# Patient Record
Sex: Male | Born: 1965
Health system: Southern US, Community
[De-identification: ages and names within clinical notes are randomized; demographics above are authoritative.]

## PROBLEM LIST (undated history)

## (undated) DIAGNOSIS — K219 Gastro-esophageal reflux disease without esophagitis: Secondary | ICD-10-CM

## (undated) DIAGNOSIS — J309 Allergic rhinitis, unspecified: Secondary | ICD-10-CM

## (undated) DIAGNOSIS — R519 Headache, unspecified: Secondary | ICD-10-CM

## (undated) DIAGNOSIS — I10 Essential (primary) hypertension: Secondary | ICD-10-CM

## (undated) DIAGNOSIS — R51 Headache: Secondary | ICD-10-CM

## (undated) DIAGNOSIS — G4730 Sleep apnea, unspecified: Secondary | ICD-10-CM

## (undated) HISTORY — PX: INGUINAL HERNIA REPAIR: SUR1180

## (undated) HISTORY — DX: Essential (primary) hypertension: I10

## (undated) HISTORY — PX: TONSILLECTOMY: SUR1361

## (undated) HISTORY — PX: ANKLE SURGERY: SHX546

## (undated) HISTORY — DX: Allergic rhinitis, unspecified: J30.9

---

## 2007-11-26 ENCOUNTER — Emergency Department: Payer: Self-pay | Admitting: Emergency Medicine

## 2009-06-15 IMAGING — CR CERVICAL SPINE - COMPLETE 4+ VIEW
1 series · 7 of 7 positions shown · non-contrast
Comparison: none

REASON FOR EXAM: pain
COMMENTS:

[Series 1: view not recorded · 0.17mm/px · 7 of 7 slices shown]
[im 1/7]
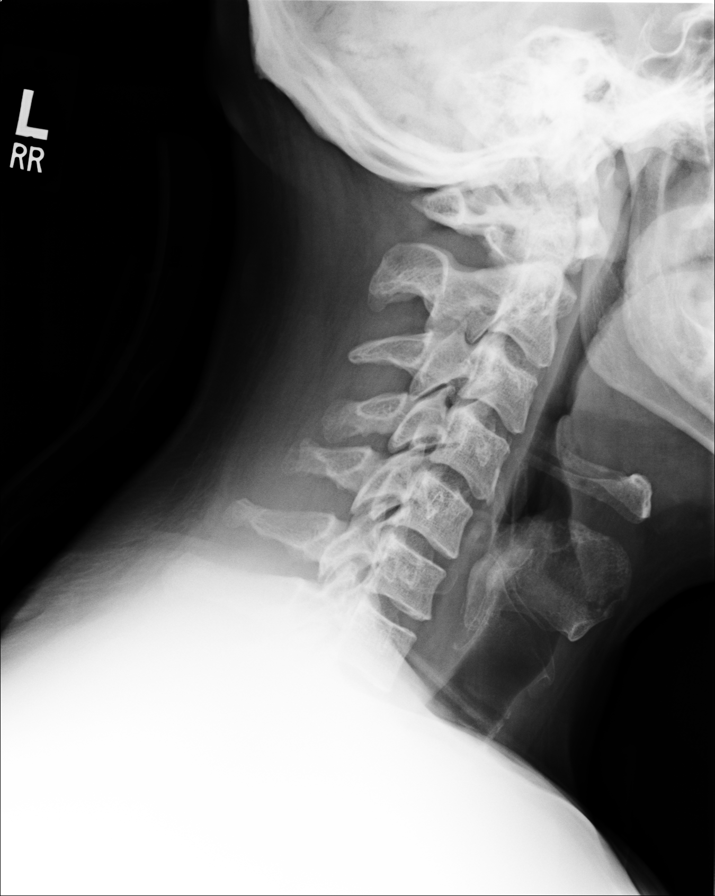
[im 2/7]
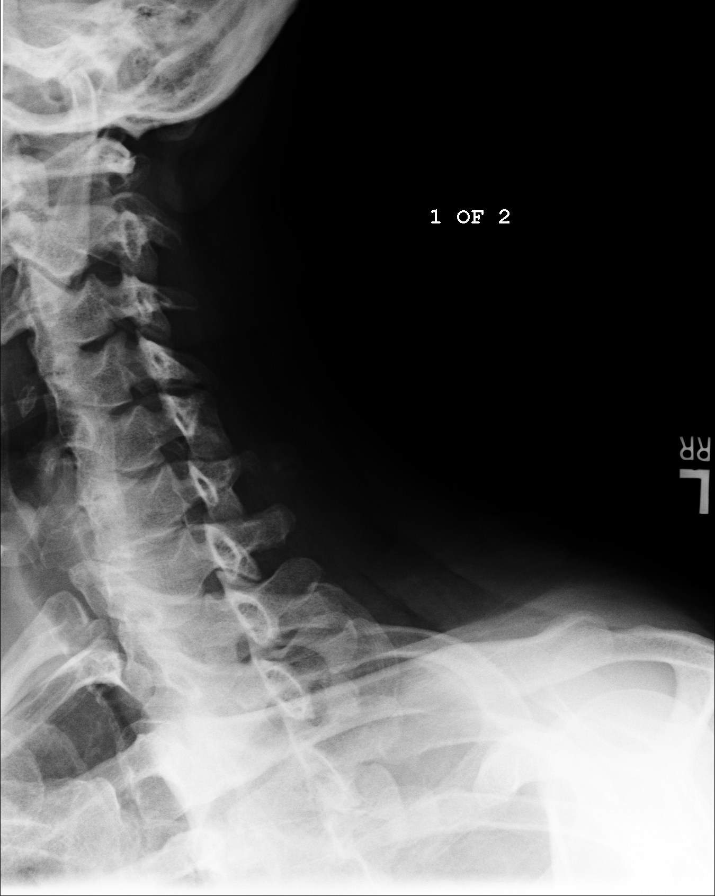
[im 3/7]
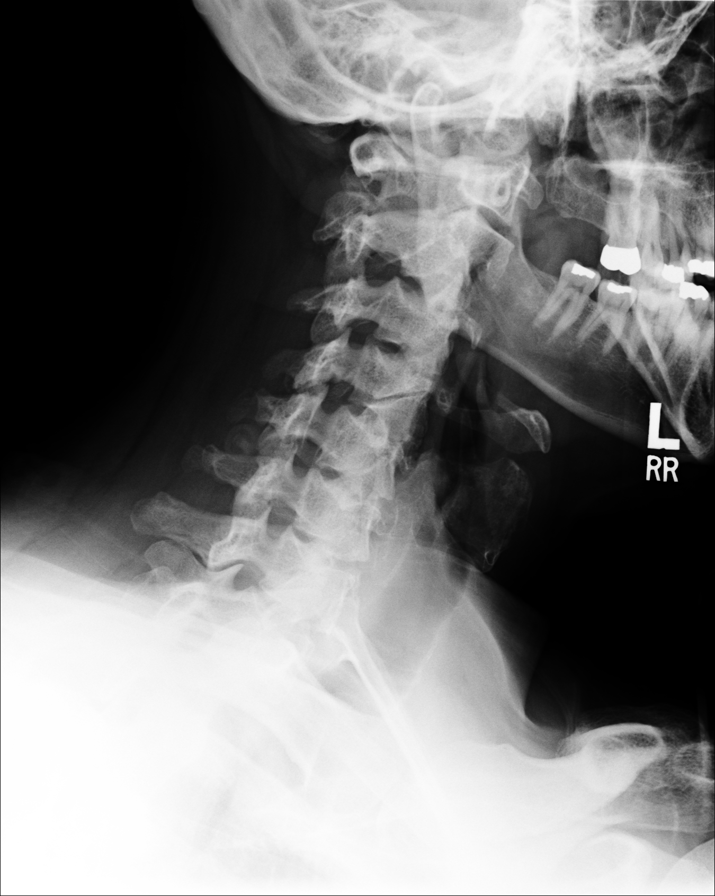
[im 4/7]
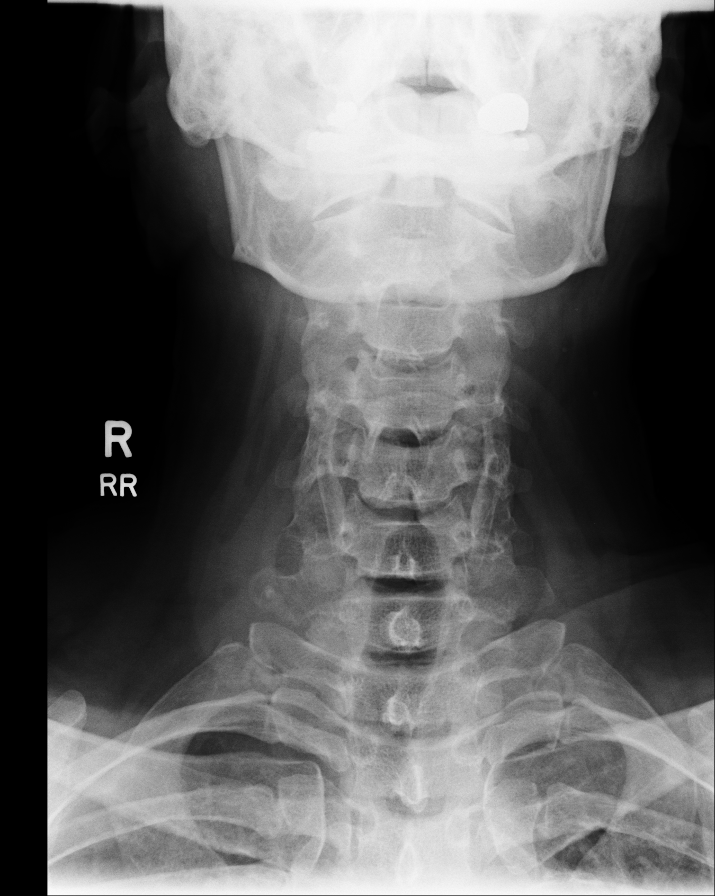
[im 5/7]
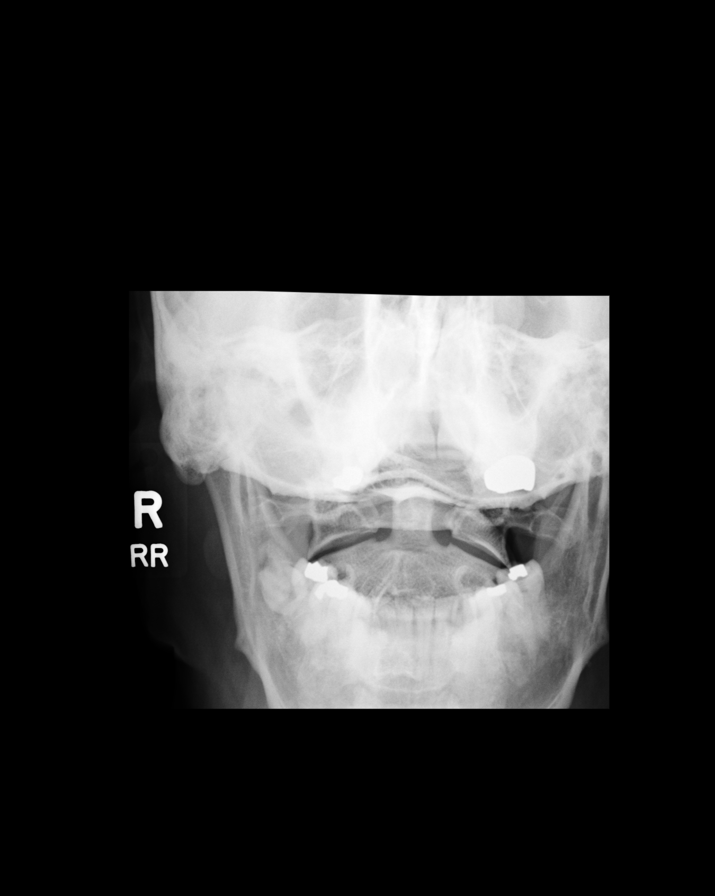
[im 6/7]
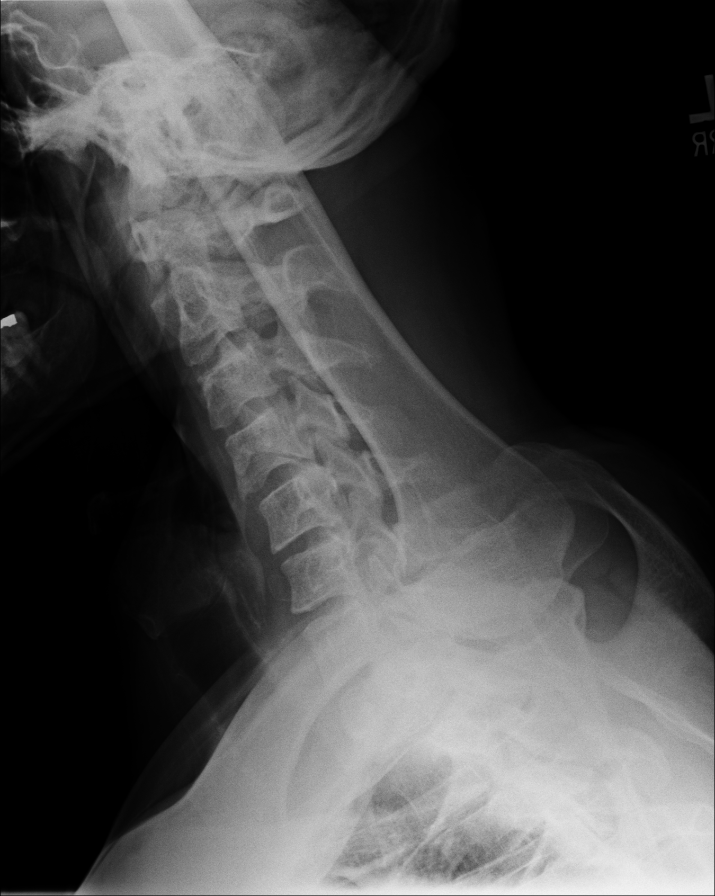
[im 7/7]
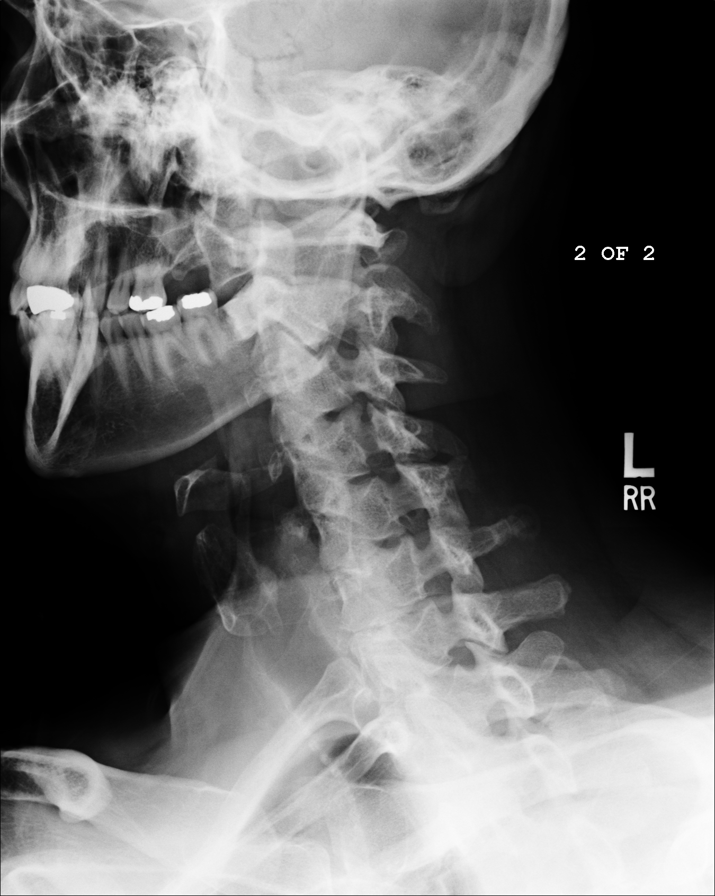

[7 of 7 positions shown; findings below may reference images not displayed]

PROCEDURE:     DXR - DXR CERVICAL SPINE COMPLETE  - November 26, 2007  [DATE]

RESULT:     Images of the cervical spine demonstrate straightening with loss
of the normal cervical lordosis. The foramina appear patent. The
prevertebral soft tissues are normal. No fracture is evident. The
craniocervical junction appears unremarkable.
IMPRESSION: 1. Loss of the normal cervical lordosis which may be secondary to muscle
spasm or positioning.
2. No acute bony abnormality evident.
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
## 2013-01-11 ENCOUNTER — Ambulatory Visit: Payer: Self-pay | Admitting: Orthopaedic Surgery

## 2014-04-12 ENCOUNTER — Ambulatory Visit: Payer: Self-pay | Admitting: Orthopaedic Surgery

## 2014-09-21 ENCOUNTER — Ambulatory Visit: Admit: 2014-09-21 | Disposition: A | Discharge status: Home / Self Care | Payer: Self-pay

## 2015-06-01 ENCOUNTER — Other Ambulatory Visit: Payer: Self-pay | Admitting: Family Medicine

## 2015-06-03 NOTE — Telephone Encounter (Signed)
apt 

## 2015-06-18 ENCOUNTER — Encounter: Payer: Self-pay | Admitting: Family Medicine

## 2015-06-18 ENCOUNTER — Ambulatory Visit (INDEPENDENT_AMBULATORY_CARE_PROVIDER_SITE_OTHER): Payer: BLUE CROSS/BLUE SHIELD | Admitting: Family Medicine

## 2015-06-18 VITALS — BP 151/91 | HR 65 | Temp 97.8°F | Ht 77.0 in | Wt 338.0 lb

## 2015-06-18 DIAGNOSIS — G4733 Obstructive sleep apnea (adult) (pediatric): Secondary | ICD-10-CM

## 2015-06-18 DIAGNOSIS — I10 Essential (primary) hypertension: Secondary | ICD-10-CM

## 2015-06-18 DIAGNOSIS — J0100 Acute maxillary sinusitis, unspecified: Secondary | ICD-10-CM

## 2015-06-18 DIAGNOSIS — Z0000 Encounter for general adult medical examination without abnormal findings: Secondary | ICD-10-CM

## 2015-06-18 MED ORDER — DOXYCYCLINE HYCLATE 100 MG PO TABS: 100.0000 mg | ORAL_TABLET | Freq: Two times a day (BID) | ORAL | Status: DC

## 2015-06-18 NOTE — Progress Notes (Signed)
BP 151/91 mmHg  Pulse 65  Temp(Src) 97.8 F (36.6 C)  Ht  (1.956 m)  Wt 338 lb (153.316 kg)  BMI 40.07 kg/m2  SpO2 97%   Subjective:    Patient ID: Jesse Wade, male    DOB: 11/20/1965, 49 y.o.   MRN: [id number]  HPI: Jesse Wade is a 49 y.o. male  Chief Complaint  Patient presents with  . Sinusitis    x 1 week, started out as a bad cold and now moved to head. Facial pain. Ear pain.   He has a sinus infection he thinks; it is on the right side; no teeth pain; not blowing out much Pain under the right eye and behind the cheek and right ear is stopped No sore throat; coughing some; sometimes produces phlegm, not seen any to tell me the color No wheezes No fevers No rash No travel, no zika exposure Wife was sick several weeks ago He has tried mucinex, severe congestion version, then tried sudafed  His BP has been decent the last few times at the doctor, though high today; he has been taking decongestants with this recent illness  OSA, but using BiPAP, full face mask  Relevant past medical, surgical, family and social history reviewed and updated as indicated. Interim medical history since our last visit reviewed. Allergies and medications reviewed and updated.  Review of Systems Per HPI unless specifically indicated above     Objective:    BP 151/91 mmHg  Pulse 65  Temp(Src) 97.8 F (36.6 C)  Ht  (1.956 m)  Wt 338 lb (153.316 kg)  BMI 40.07 kg/m2  SpO2 97%  Wt Readings from Last 3 Encounters:  06/18/15 338 lb (153.316 kg)  04/20/15 341 lb (154.677 kg)  04/19/14 341 lb (154.677 kg)   body mass index is 40.07 kg/(m^2).  Physical Exam  Constitutional: He appears well-developed and well-nourished. No distress.  Morbidly obese  HENT:  Head: Normocephalic and atraumatic.  Right Ear: Hearing and external ear normal.  Left Ear: Hearing and external ear normal.  Nose: Mucosal edema and rhinorrhea present. Right sinus exhibits frontal sinus tenderness.   Mouth/Throat: Oropharynx is clear and moist and mucous membranes are normal.  Eyes: EOM are normal. Right eye exhibits no discharge. Left eye exhibits no discharge. Right conjunctiva is not injected. Left conjunctiva is not injected. No scleral icterus.  Neck: No thyromegaly present.  Cardiovascular: Normal rate and regular rhythm.   Pulmonary/Chest: Effort normal and breath sounds normal.  Abdominal: Soft. Bowel sounds are normal. He exhibits no distension.  Musculoskeletal: He exhibits no edema.  Lymphadenopathy:    He has no cervical adenopathy.  Neurological: Coordination normal.  Skin: Skin is warm and dry. No pallor.  Psychiatric: He has a normal mood and affect. His behavior is normal. Judgment and thought content normal.      Assessment & Plan:   Problem List Items Addressed This Visit      Cardiovascular and Mediastinum   Hypertension    See AVS; not controlled today; avoid decongestans; try to follow DASH guidelines; recommended weight loss; return for close f/u        Respiratory   OSA treated with BiPAP    Weight loss would be helpful        Other   Morbid obesity (HCC)    Weight loss recommended; see the AVS for information and recommendations given; return for recheck in a few weeks       Other  Visit Diagnoses    Acute maxillary sinusitis, recurrence not specified    -  Primary    rest, hydration, antibiotics; discussed risk of C diff, see AVS    Relevant Medications    doxycycline (VIBRA-TABS) 100 MG tablet    Preventative health care        just used for blood work; he'll return for the true physical in 3-4 weeks    Relevant Orders    Lipid Panel w/o Chol/HDL Ratio (Completed)    PSA (Completed)    Comprehensive metabolic panel (Completed)       Follow up plan: Return 3-4 weeks, for complete physical and BP recheck.  Meds ordered this encounter  Medications  . doxycycline (VIBRA-TABS) 100 MG tablet    Sig: Take 1 tablet (100 mg total) by mouth  2 (two) times daily.    Dispense:  20 tablet    Refill:  0   An after-visit summary was printed and given to the patient at check-out.  Please see the patient instructions which may contain other information and recommendations beyond what is mentioned above in the assessment and plan.

## 2015-06-18 NOTE — Patient Instructions (Addendum)
Try to use PLAIN allergy or cold medicine without the decongestant Avoid: phenylephrine, phenylpropanolamine, and pseudoephredine Your goal blood pressure is less than 140 mmHg on top. Try to follow the DASH guidelines (DASH stands for Dietary Approaches to Stop Hypertension) Try to limit the sodium in your diet.  Ideally, consume less than 1.5 grams (less than 1,500mg ) per day. Do not add salt when cooking or at the table.  Check the sodium amount on labels when shopping, and choose items lower in sodium when given a choice. Avoid or limit foods that already contain a lot of sodium. Eat a diet rich in fruits and vegetables and whole grains. Monitor your blood pressure three times a week and call us in two weeks with your readings Return in three to four weeks for a physical and blood pressure and weight recheck here I do recommend a baby aspirin 81 mg daily (coated to protect your stomach) for heart protection If you start to exercise, start slow and build up gradually  Start the antibiotics Please do eat yogurt daily or take a probiotic daily for the next month or two We want to replace the healthy germs in the gut If you notice foul, watery diarrhea in the next two months, schedule an appointment RIGHT AWAY  Check out the information at familydoctor.org entitled "What It Takes to Lose Weight" Try to lose between 1-2 pounds per week by taking in fewer calories and burning off more calories You can succeed by limiting portions, limiting foods dense in calories and fat, becoming more active, and drinking 8 glasses of water a day (64 ounces) Don't skip meals, especially breakfast, as skipping meals may alter your metabolism Do not use over-the-counter weight loss pills or gimmicks that claim rapid weight loss A healthy BMI (or body mass index) is between 18.5 and 24.9 You can calculate your ideal BMI at the NIH website [employer]  DASH  Eating Plan DASH stands for "Dietary Approaches to Stop Hypertension." The DASH eating plan is a healthy eating plan that has been shown to reduce high blood pressure (hypertension). Additional health benefits may include reducing the risk of type 2 diabetes mellitus, heart disease, and stroke. The DASH eating plan may also help with weight loss. WHAT DO I NEED TO KNOW ABOUT THE DASH EATING PLAN? For the DASH eating plan, you will follow these general guidelines:  Choose foods with a percent daily value for sodium of less than 5% (as listed on the food label).  Use salt-free seasonings or herbs instead of table salt or sea salt.  Check with your health care provider or pharmacist before using salt substitutes.  Eat lower-sodium products, often labeled as "lower sodium" or "no salt added."  Eat fresh foods.  Eat more vegetables, fruits, and low-fat dairy products.  Choose whole grains. Look for the word "whole" as the first word in the ingredient list.  Choose fish and skinless chicken or [place] more often than red meat. Limit fish, poultry, and meat to 6 oz (170 g) each day.  Limit sweets, desserts, sugars, and sugary drinks.  Choose heart-healthy fats.  Limit cheese to 1 oz (28 g) per day.  Eat more home-cooked food and less restaurant, buffet, and fast food.  Limit fried foods.  Cook foods using methods other than frying.  Limit canned vegetables. If you do use them, rinse them well to decrease the sodium.  When eating at a restaurant, ask that your food be prepared with less salt, or no  salt if possible. WHAT FOODS CAN I EAT? Seek help from a dietitian for individual calorie needs. Grains Whole grain or whole wheat bread. Brown rice. Whole grain or whole wheat pasta. Quinoa, bulgur, and whole grain cereals. Low-sodium cereals. Corn or whole wheat flour tortillas. Whole grain cornbread. Whole grain crackers. Low-sodium crackers. Vegetables Fresh or frozen vegetables (raw,  steamed, roasted, or grilled). Low-sodium or reduced-sodium tomato and vegetable juices. Low-sodium or reduced-sodium tomato sauce and paste. Low-sodium or reduced-sodium canned vegetables.  Fruits All fresh, canned (in natural juice), or frozen fruits. Meat and Other Protein Products Ground beef (85% or leaner), grass-fed beef, or beef trimmed of fat. Skinless chicken or [place]. Ground chicken or [place]. Pork trimmed of fat. All fish and seafood. Eggs. Dried beans, peas, or lentils. Unsalted nuts and seeds. Unsalted canned beans. Dairy Low-fat dairy products, such as skim or 1% milk, 2% or reduced-fat cheeses, low-fat ricotta or cottage cheese, or plain low-fat yogurt. Low-sodium or reduced-sodium cheeses. Fats and Oils Tub margarines without trans fats. Light or reduced-fat mayonnaise and salad dressings (reduced sodium). Avocado. Safflower, olive, or canola oils. Natural peanut or almond butter. Other Unsalted popcorn and pretzels. The items listed above may not be a complete list of recommended foods or beverages. Contact your dietitian for more options. WHAT FOODS ARE NOT RECOMMENDED? Grains White bread. White pasta. White rice. Refined cornbread. Bagels and croissants. Crackers that contain trans fat. Vegetables Creamed or fried vegetables. Vegetables in a cheese sauce. Regular canned vegetables. Regular canned tomato sauce and paste. Regular tomato and vegetable juices. Fruits Dried fruits. Canned fruit in light or heavy syrup. Fruit juice. Meat and Other Protein Products Fatty cuts of meat. Ribs, chicken wings, bacon, sausage, bologna, salami, chitterlings, fatback, hot dogs, bratwurst, and packaged luncheon meats. Salted nuts and seeds. Canned beans with salt. Dairy Whole or 2% milk, cream, half-and-half, and cream cheese. Whole-fat or sweetened yogurt. Full-fat cheeses or blue cheese. Nondairy creamers and whipped toppings. Processed cheese, cheese spreads, or cheese  curds. Condiments Onion and garlic salt, seasoned salt, table salt, and sea salt. Canned and packaged gravies. Worcestershire sauce. Tartar sauce. Barbecue sauce. Teriyaki sauce. Soy sauce, including reduced sodium. Steak sauce. Fish sauce. Oyster sauce. Cocktail sauce. Horseradish. Ketchup and mustard. Meat flavorings and tenderizers. Bouillon cubes. Hot sauce. Tabasco sauce. Marinades. Taco seasonings. Relishes. Fats and Oils Butter, stick margarine, lard, shortening, ghee, and bacon fat. Coconut, palm kernel, or palm oils. Regular salad dressings. Other Pickles and olives. Salted popcorn and pretzels. The items listed above may not be a complete list of foods and beverages to avoid. Contact your dietitian for more information. WHERE CAN I FIND MORE INFORMATION? National Heart, Lung, and Blood Institute: CableProm[employer].itwww.nhlbi.nih.gov/health/health-topics/topics/dash/   This information is not intended to replace advice given to you by your health care provider. Make sure you discuss any questions you have with your health care provider.   Document Released: 05/28/2011 Document Revised: 06/29/2014 Document Reviewed: 04/12/2013 Elsevier Interactive Patient Education [employer]2016 Elsevier Inc.

## 2015-06-19 LAB — COMPREHENSIVE METABOLIC PANEL
A/G RATIO: 1.7 (ref 1.1–2.5)
ALBUMIN: 4 g/dL (ref 3.5–5.5)
ALT: 28 IU/L (ref 0–44)
AST: 22 IU/L (ref 0–40)
Alkaline Phosphatase: 85 IU/L (ref 39–117)
BUN / CREAT RATIO: 13 (ref 9–20)
BUN: 12 mg/dL (ref 6–24)
Bilirubin Total: 0.3 mg/dL (ref 0.0–1.2)
CALCIUM: 9.2 mg/dL (ref 8.7–10.2)
CO2: 24 mmol/L (ref 18–29)
CREATININE: 0.91 mg/dL (ref 0.76–1.27)
Chloride: 104 mmol/L (ref 96–106)
GFR, EST AFRICAN AMERICAN: 114 mL/min/{1.73_m2} (ref >59)
GFR, EST NON AFRICAN AMERICAN: 99 mL/min/{1.73_m2} (ref >59)
GLOBULIN, TOTAL: 2.4 g/dL (ref 1.5–4.5)
Glucose: 97 mg/dL (ref 65–99)
Potassium: 4.3 mmol/L (ref 3.5–5.2)
SODIUM: 143 mmol/L (ref 134–144)
Total Protein: 6.4 g/dL (ref 6.0–8.5)

## 2015-06-19 LAB — LIPID PANEL W/O CHOL/HDL RATIO
CHOLESTEROL TOTAL: 146 mg/dL (ref 100–199)
HDL: 40 mg/dL (ref >39)
LDL Calculated: 90 mg/dL (ref 0–99)
TRIGLYCERIDES: 82 mg/dL (ref 0–149)
VLDL Cholesterol Cal: 16 mg/dL (ref 5–40)

## 2015-06-19 LAB — PSA: Prostate Specific Ag, Serum: 0.5 ng/mL (ref 0.0–4.0)

## 2015-06-20 ENCOUNTER — Telehealth: Payer: Self-pay | Admitting: Family Medicine

## 2015-06-20 NOTE — Telephone Encounter (Signed)
I talked with patient, explained labs look great His home number is no longer valid ------------------ [facility] staff -- please remove home number; he is using his cell number now; thank you

## 2015-06-22 DIAGNOSIS — G4733 Obstructive sleep apnea (adult) (pediatric): Secondary | ICD-10-CM | POA: Insufficient documentation

## 2015-06-22 NOTE — Assessment & Plan Note (Signed)
Weight loss recommended; see the AVS for information and recommendations given; return for recheck in a few weeks

## 2015-06-22 NOTE — Assessment & Plan Note (Signed)
Weight loss would be helpful

## 2015-06-22 NOTE — Assessment & Plan Note (Signed)
See AVS; not controlled today; avoid decongestans; try to follow DASH guidelines; recommended weight loss; return for close f/u

## 2015-08-19 ENCOUNTER — Telehealth: Payer: Self-pay | Admitting: Family Medicine

## 2015-08-19 ENCOUNTER — Other Ambulatory Visit: Payer: Self-pay | Admitting: Family Medicine

## 2015-08-19 MED ORDER — OSELTAMIVIR PHOSPHATE 75 MG PO CAPS: 75.0000 mg | ORAL_CAPSULE | Freq: Every day | ORAL | Status: DC

## 2015-08-19 MED ORDER — OSELTAMIVIR PHOSPHATE 75 MG PO CAPS
75.0000 mg | ORAL_CAPSULE | Freq: Every day | ORAL | Status: DC
Start: 2015-08-19 — End: 2016-02-04

## 2015-08-19 NOTE — Telephone Encounter (Signed)
Wife diagnosed with flu. Needs prophylactic medicine. Rx sent to his pharmacy.

## 2015-09-08 ENCOUNTER — Other Ambulatory Visit: Payer: Self-pay | Admitting: Family Medicine

## 2015-09-23 ENCOUNTER — Encounter: Payer: BLUE CROSS/BLUE SHIELD | Admitting: Family Medicine

## 2015-12-09 ENCOUNTER — Other Ambulatory Visit: Payer: Self-pay | Admitting: Family Medicine

## 2015-12-09 NOTE — Telephone Encounter (Signed)
Apt?

## 2015-12-10 ENCOUNTER — Encounter: Payer: Self-pay | Admitting: Family Medicine

## 2015-12-10 NOTE — Telephone Encounter (Signed)
Letter sent.

## 2016-01-16 ENCOUNTER — Other Ambulatory Visit: Payer: Self-pay | Admitting: Family Medicine

## 2016-02-04 ENCOUNTER — Ambulatory Visit (INDEPENDENT_AMBULATORY_CARE_PROVIDER_SITE_OTHER): Payer: 59 | Admitting: Family Medicine

## 2016-02-04 ENCOUNTER — Encounter: Payer: Self-pay | Admitting: Family Medicine

## 2016-02-04 DIAGNOSIS — K429 Umbilical hernia without obstruction or gangrene: Secondary | ICD-10-CM

## 2016-02-04 MED ORDER — BENAZEPRIL HCL 40 MG PO TABS: 40.0000 mg | ORAL_TABLET | Freq: Every day | ORAL | 6 refills | Status: DC

## 2016-02-04 NOTE — Assessment & Plan Note (Signed)
Medication education given including incarceration Surgical referral and limitations discussed

## 2016-02-04 NOTE — Progress Notes (Signed)
BP 125/77 (BP Location: Left Arm, Patient Position: Sitting, Cuff Size: Normal)   Pulse (!) 56   Temp 97.9 F (36.6 C)   Ht 6' 5.1" (1.958 m)   Wt (!) 309 lb (140.2 kg)   SpO2 98%   BMI 36.55 kg/m    Subjective:    Patient ID: Jesse Wade, male    DOB: Oct 24, 1965, 50 y.o.   MRN: [id number]  HPI: Jesse Wade is a 50 y.o. male  Chief Complaint  Patient presents with  . Hernia    possible  Patient with. Umbilical discomfort bulging is gotten bigger down there and more uncomfortable has been ongoing off and on for years now is gotten worse Blood pressure doing well no complaints from medications taking Benzapril without problems. Relevant past medical, surgical, family and social history reviewed and updated as indicated. Interim medical history since our last visit reviewed. Allergies and medications reviewed and updated.  Review of Systems  Constitutional: Negative.   Respiratory: Negative.   Cardiovascular: Negative.     Per HPI unless specifically indicated above     Objective:    BP 125/77 (BP Location: Left Arm, Patient Position: Sitting, Cuff Size: Normal)   Pulse (!) 56   Temp 97.9 F (36.6 C)   Ht 6' 5.1" (1.958 m)   Wt (!) 309 lb (140.2 kg)   SpO2 98%   BMI 36.55 kg/m   Wt Readings from Last 3 Encounters:  02/04/16 (!) 309 lb (140.2 kg)  06/18/15 (!) 338 lb (153.3 kg)  04/20/15 (!) 341 lb (154.7 kg)    Physical Exam  Constitutional: He is oriented to person, place, and time. He appears well-developed and well-nourished. No distress.  HENT:  Head: Normocephalic and atraumatic.  Right Ear: Hearing normal.  Left Ear: Hearing normal.  Nose: Nose normal.  Eyes: Conjunctivae and lids are normal. Right eye exhibits no discharge. Left eye exhibits no discharge. No scleral icterus.  Cardiovascular: Normal rate, regular rhythm and normal heart sounds.   Pulmonary/Chest: Effort normal and breath sounds normal. No respiratory distress.  Abdominal: Soft.  Bowel sounds are normal.  Tender enlarged umbilical hernia  Musculoskeletal: Normal range of motion.  Neurological: He is alert and oriented to person, place, and time.  Skin: Skin is intact. No rash noted.  Psychiatric: He has a normal mood and affect. His speech is normal and behavior is normal. Judgment and thought content normal. Cognition and memory are normal.    Results for orders placed or performed in visit on 06/18/15  Lipid Panel w/o Chol/HDL Ratio  Result Value Ref Range   Cholesterol, Total 146 100 - 199 mg/dL   Triglycerides 82 0 - 149 mg/dL   HDL 40 >40>39 mg/dL   VLDL Cholesterol Cal 16 5 - 40 mg/dL   LDL Calculated 90 0 - 99 mg/dL  PSA  Result Value Ref Range   Prostate Specific Ag, Serum 0.5 0.0 - 4.0 ng/mL  Comprehensive metabolic panel  Result Value Ref Range   Glucose 97 65 - 99 mg/dL   BUN 12 6 - 24 mg/dL   Creatinine, Ser [id number] 0.76 - 1.27 mg/dL   GFR calc non Af Amer 99 >59 mL/min/1.73   GFR calc Af Amer 114 >59 mL/min/1.73   BUN/Creatinine Ratio 13 9 - 20   Sodium 143 134 - 144 mmol/L   Potassium 4.3 3.5 - 5.2 mmol/L   Chloride 104 96 - 106 mmol/L   CO2 24 18 - 29 mmol/L  Calcium 9.2 8.7 - 10.2 mg/dL   Total Protein 6.4 6.0 - 8.5 g/dL   Albumin 4.0 3.5 - 5.5 g/dL   Globulin, Total 2.4 1.5 - 4.5 g/dL   Albumin/Globulin Ratio 1.7 1.1 - 2.5   Bilirubin Total 0.3 0.0 - 1.2 mg/dL   Alkaline Phosphatase 85 39 - 117 IU/L   AST 22 0 - 40 IU/L   ALT 28 0 - 44 IU/L      Assessment & Plan:   Problem List Items Addressed This Visit      Other   Umbilical hernia    Medication education given including incarceration Surgical referral and limitations discussed      Relevant Orders   Ambulatory referral to General Surgery    Other Visit Diagnoses   None.      Follow up plan: Return if symptoms worsen or fail to improve, for Physical Exam.

## 2016-02-05 ENCOUNTER — Encounter: Payer: Self-pay | Admitting: *Deleted

## 2016-02-18 ENCOUNTER — Ambulatory Visit: Payer: Self-pay | Admitting: General Surgery

## 2016-02-18 ENCOUNTER — Ambulatory Visit (INDEPENDENT_AMBULATORY_CARE_PROVIDER_SITE_OTHER): Payer: 59 | Admitting: General Surgery

## 2016-02-18 ENCOUNTER — Encounter: Payer: Self-pay | Admitting: General Surgery

## 2016-02-18 VITALS — BP 130/72 | HR 76 | Resp 14 | Ht 77.0 in | Wt 316.0 lb

## 2016-02-18 DIAGNOSIS — K429 Umbilical hernia without obstruction or gangrene: Secondary | ICD-10-CM

## 2016-02-18 NOTE — Patient Instructions (Addendum)
The patient is aware to call back for any questions or concerns. Hernia, Adult A hernia is the bulging of an organ or tissue through a weak spot in the muscles of the abdomen (abdominal wall). Hernias develop most often near the navel or groin. There are many kinds of hernias. Common kinds include:  Femoral hernia. This kind of hernia develops under the groin in the upper thigh area.  Inguinal hernia. This kind of hernia develops in the groin or scrotum.  Umbilical hernia. This kind of hernia develops near the navel.  Hiatal hernia. This kind of hernia causes part of the stomach to be pushed up into the chest.  Incisional hernia. This kind of hernia bulges through a scar from an abdominal surgery. CAUSES This condition may be caused by:  Heavy lifting.  Coughing over a long period of time.  Straining to have a bowel movement.  An incision made during an abdominal surgery.  A birth defect (congenital defect).  Excess weight or obesity.  Smoking.  Poor nutrition.  Cystic fibrosis.  Excess fluid in the abdomen.  Undescended testicles. SYMPTOMS Symptoms of a hernia include:  A lump on the abdomen. This is the first sign of a hernia. The lump may become more obvious with standing, straining, or coughing. It may get bigger over time if it is not treated or if the condition causing it is not treated.  Pain. A hernia is usually painless, but it may become painful over time if treatment is delayed. The pain is usually dull and may get worse with standing or lifting heavy objects. Sometimes a hernia gets tightly squeezed in the weak spot (strangulated) or stuck there (incarcerated) and causes additional symptoms. These symptoms may include:  Vomiting.  Nausea.  Constipation.  Irritability. DIAGNOSIS A hernia may be diagnosed with:  A physical exam. During the exam your health care provider may ask you to cough or to make a specific movement, because a hernia is usually  more visible when you move.  Imaging tests. These can include:  X-rays.  Ultrasound.  CT scan. TREATMENT A hernia that is small and painless may not need to be treated. A hernia that is large or painful may be treated with surgery. Inguinal hernias may be treated with surgery to prevent incarceration or strangulation. Strangulated hernias are always treated with surgery, because lack of blood to the trapped organ or tissue can cause it to die. Surgery to treat a hernia involves pushing the bulge back into place and repairing the weak part of the abdomen. HOME CARE INSTRUCTIONS  Avoid straining.  Do not lift anything heavier than 10 lb (4.5 kg).  Lift with your leg muscles, not your back muscles. This helps avoid strain.  When coughing, try to cough gently.  Prevent constipation. Constipation leads to straining with bowel movements, which can make a hernia worse or cause a hernia repair to break down. You can prevent constipation by:  Eating a high-fiber diet that includes plenty of fruits and vegetables.  Drinking enough fluids to keep your urine clear or pale yellow. Aim to drink 6-8 glasses of water per day.  Using a stool softener as directed by your health care provider.  Lose weight, if you are overweight.  Do not use any tobacco products, including cigarettes, chewing tobacco, or electronic cigarettes. If you need help quitting, ask your health care provider.  Keep all follow-up visits as directed by your health care provider. This is important. Your health care provider may   need to monitor your condition. SEEK MEDICAL CARE IF:  You have swelling, redness, and pain in the affected area.  Your bowel habits change. SEEK IMMEDIATE MEDICAL CARE IF:  You have a fever.  You have abdominal pain that is getting worse.  You feel nauseous or you vomit.  You cannot push the hernia back in place by gently pressing on it while you are lying down.  The hernia:  Changes in  shape or size.  Is stuck outside the abdomen.  Becomes discolored.  Feels hard or tender.   This information is not intended to replace advice given to you by your health care provider. Make sure you discuss any questions you have with your health care provider.   Document Released: 06/08/2005 Document Revised: 06/29/2014 Document Reviewed: 04/18/2014 Elsevier Interactive Patient Education 2016 Elsevier Inc.  

## 2016-02-18 NOTE — Progress Notes (Signed)
Patient ID: Jesse Wade, male   DOB: 1966/02/28, 50 y.o.   MRN: [id number]  Chief Complaint  Patient presents with  . Hernia    HPI Jesse Wade is a 50 y.o. male here today for a evaluation of a umbilical hernia. Patient states the discomfort bulging was noticed about 1-2 months ago. The discomfort is described as dull ache. He just started back going to the gym over the past 6-8 months and he has lost 30-35 pounds. I have reviewed the history of present illness with the patient.    HPI  Past Medical History:  Diagnosis Date  . Allergic rhinitis   . Hypertension     Past Surgical History:  Procedure Laterality Date  . ANKLE SURGERY Right 2014, 2015   x3  . INGUINAL HERNIA REPAIR Bilateral    childhood  . TONSILLECTOMY      Family History  Problem Relation Age of Onset  . Heart disease Mother   . Alcohol abuse Father   . Heart disease Maternal Aunt   . Heart disease Maternal Grandmother   . COPD Maternal Grandmother   . Diabetes Maternal Grandfather   . Cancer Neg Hx   . Hypertension Neg Hx   . Stroke Neg Hx     Social History Social History  Substance Use Topics  . Smoking status: Never Smoker  . Smokeless tobacco: Never Used  . Alcohol use No    Allergies  Allergen Reactions  . Amlodipine Other (See Comments)    Edema   . Penicillins Rash    Current Outpatient Prescriptions  Medication Sig Dispense Refill  . benazepril (LOTENSIN) 40 MG tablet Take 1 tablet (40 mg total) by mouth daily. 30 tablet 6  . levocetirizine (XYZAL) 5 MG tablet Take 5 mg by mouth every evening.   6   No current facility-administered medications for this visit.     Review of Systems Review of Systems  Constitutional: Negative.   Respiratory: Negative.   Cardiovascular: Negative.     Blood pressure 130/72, pulse 76, resp. rate 14, height 6\' 5"  (1.956 m), weight (!) 316 lb (143.3 kg).  Physical Exam Physical Exam  Constitutional: He is oriented to person, place, and  time. He appears well-developed and well-nourished.  HENT:  Mouth/Throat: Oropharynx is clear and moist.  Eyes: Conjunctivae are normal. No scleral icterus.  Neck: Neck supple.  Cardiovascular: Normal rate, regular rhythm and normal heart sounds.   Pulmonary/Chest: Effort normal and breath sounds normal.  Abdominal: Soft. Normal appearance and bowel sounds are normal. There is tenderness. A hernia is present. Hernia confirmed negative in the right inguinal area and confirmed negative in the left inguinal area.  3 cm umbilical hernia, partially reducible mildly tender.  Lymphadenopathy:    He has no cervical adenopathy.  Neurological: He is alert and oriented to person, place, and time.  Skin: Skin is warm and dry.  Psychiatric: His behavior is normal.    Data Reviewed Progress notes.  Assessment    3 cm umbilical hernia, partially reducible mildly tender. Recommended repair, discussed fully with pt.He is agreeable.    Plan    Hernia precautions and incarceration were discussed with the patient. If they develop symptoms of an incarcerated hernia, they were encouraged to seek prompt medical attention.  I have recommended repair of the hernia using mesh on an outpatient basis in the near future. The risk of infection was reviewed. The role of prosthetic mesh to minimize the risk of recurrence  was reviewed.  Plan on 1-2 weeks recovery time.     This information has been scribed by Dorathy DaftMarsha Hatch RN, BSN,BC.   Tahjae Durr G 02/18/2016, 4:46 PM

## 2016-02-19 ENCOUNTER — Telehealth: Payer: Self-pay

## 2016-02-19 ENCOUNTER — Other Ambulatory Visit: Payer: Self-pay | Admitting: General Surgery

## 2016-02-19 DIAGNOSIS — K4090 Unilateral inguinal hernia, without obstruction or gangrene, not specified as recurrent: Secondary | ICD-10-CM

## 2016-02-19 NOTE — Telephone Encounter (Signed)
Call to patient to see about scheduling his surgery for Umbilical hernia repair. The patient is scheduled [facility]. He will pre admit by phone. The patient is aware of date and instructions.

## 2016-02-21 ENCOUNTER — Encounter
Admission: RE | Admit: 2016-02-21 | Discharge: 2016-02-21 | Disposition: A | Discharge status: Home / Self Care | Payer: 59 | Source: Ambulatory Visit | Attending: General Surgery | Admitting: General Surgery

## 2016-02-21 HISTORY — DX: Headache: R51

## 2016-02-21 HISTORY — DX: Sleep apnea, unspecified: G47.30

## 2016-02-21 HISTORY — DX: Headache, unspecified: R51.9

## 2016-02-21 NOTE — Patient Instructions (Signed)
  Your procedure is scheduled on: 02-25-16  Report to Same Day Surgery 2nd floor medical mall To find out your arrival time please call [phone] between 1PM - 3PM on   Remember: Instructions that are not followed completely may result in serious medical risk, up to and including death, or upon the discretion of your surgeon and anesthesiologist your surgery may need to be rescheduled.    _x___ 1. Do not eat food or drink liquids after midnight. No gum chewing or hard candies.     __x__ 2. No Alcohol for 24 hours before or after surgery.   __x__3. No Smoking for 24 prior to surgery.   ____  4. Bring all medications with you on the day of surgery if instructed.    __x__ 5. Notify your doctor if there is any change in your medical condition     (cold, fever, infections).     Do not wear jewelry, make-up, hairpins, clips or nail polish.  Do not wear lotions, powders, or perfumes. You may wear deodorant.  Do not shave 48 hours prior to surgery. Men may shave face and neck.  Do not bring valuables to the hospital.    [facility] is not responsible for any belongings or valuables.               Contacts, dentures or bridgework may not be worn into surgery.  Leave your suitcase in the car. After surgery it may be brought to your room.  For patients admitted to the hospital, discharge time is determined by your treatment team.   Patients discharged the day of surgery will not be allowed to drive home.    Please read over the following fact sheets that you were given:   [facility] Preparing for Surgery and or MRSA Information   _x___ Take these medicines the morning of surgery with A SIP OF WATER:    1. BENAZEPRIL  2.  3.  4.  5.  6.  ____ Fleet Enema (as directed)   ____ Use CHG Soap or sage wipes as directed on instruction sheet   ____ Use inhalers on the day of surgery and bring to hospital day of surgery  ____ Stop metformin 2 days prior to surgery    ____ Take 1/2 of  usual insulin dose the night before surgery and none on the morning of surgery.   ____ Stop aspirin or coumadin, or plavix  _x__ Stop Anti-inflammatories such as Advil, Aleve, Ibuprofen, Motrin, Naproxen,          Naprosyn, Goodies powders or aspirin products. Ok to take Tylenol.   ____ Stop supplements until after surgery.    ____ Bring C-Pap to the hospital.

## 2016-02-25 ENCOUNTER — Encounter: Payer: Self-pay | Admitting: *Deleted

## 2016-02-25 ENCOUNTER — Ambulatory Visit: Payer: 59 | Admitting: Anesthesiology

## 2016-02-25 ENCOUNTER — Encounter
Admission: RE | Disposition: A | Discharge status: Home / Self Care | Payer: Self-pay | Source: Ambulatory Visit | Attending: General Surgery

## 2016-02-25 ENCOUNTER — Ambulatory Visit
Admission: RE | Admit: 2016-02-25 | Discharge: 2016-02-25 | Disposition: A | Discharge status: Home / Self Care | Payer: 59 | Source: Ambulatory Visit | Attending: General Surgery | Admitting: General Surgery

## 2016-02-25 DIAGNOSIS — K4090 Unilateral inguinal hernia, without obstruction or gangrene, not specified as recurrent: Secondary | ICD-10-CM

## 2016-02-25 DIAGNOSIS — I10 Essential (primary) hypertension: Secondary | ICD-10-CM | POA: Insufficient documentation

## 2016-02-25 DIAGNOSIS — K429 Umbilical hernia without obstruction or gangrene: Secondary | ICD-10-CM | POA: Diagnosis not present

## 2016-02-25 DIAGNOSIS — G4730 Sleep apnea, unspecified: Secondary | ICD-10-CM | POA: Insufficient documentation

## 2016-02-25 HISTORY — PX: UMBILICAL HERNIA REPAIR: SHX196

## 2016-02-25 SURGERY — REPAIR, HERNIA, UMBILICAL, ADULT
Anesthesia: General | Wound class: Clean

## 2016-02-25 MED ORDER — CEFAZOLIN SODIUM-DEXTROSE 2-4 GM/100ML-% IV SOLN
INTRAVENOUS | Status: AC
  Filled 2016-02-25: qty 100

## 2016-02-25 MED ORDER — ONDANSETRON HCL 4 MG/2ML IJ SOLN: 4.0000 mg | Freq: Once | INTRAMUSCULAR | Status: DC | PRN

## 2016-02-25 MED ORDER — ACETAMINOPHEN 10 MG/ML IV SOLN
INTRAVENOUS | Status: DC | PRN
  Administered 2016-02-25: 1000 mg via INTRAVENOUS

## 2016-02-25 MED ORDER — FAMOTIDINE 20 MG PO TABS
ORAL_TABLET | ORAL | Status: AC
  Filled 2016-02-25: qty 1

## 2016-02-25 MED ORDER — BUPIVACAINE HCL (PF) 0.5 % IJ SOLN
INTRAMUSCULAR | Status: AC
  Filled 2016-02-25: qty 30

## 2016-02-25 MED ORDER — LIDOCAINE HCL (PF) 1 % IJ SOLN
INTRAMUSCULAR | Status: AC
  Filled 2016-02-25: qty 30

## 2016-02-25 MED ORDER — OXYCODONE-ACETAMINOPHEN 5-325 MG PO TABS: 1.0000 | ORAL_TABLET | ORAL | 0 refills | Status: DC | PRN

## 2016-02-25 MED ORDER — ACETAMINOPHEN 10 MG/ML IV SOLN
INTRAVENOUS | Status: AC
  Filled 2016-02-25: qty 100

## 2016-02-25 MED ORDER — GLYCOPYRROLATE 0.2 MG/ML IJ SOLN
INTRAMUSCULAR | Status: DC | PRN
  Administered 2016-02-25: 0.2 mg via INTRAVENOUS

## 2016-02-25 MED ORDER — CEFAZOLIN SODIUM-DEXTROSE 2-4 GM/100ML-% IV SOLN
2.0000 g | INTRAVENOUS | Status: AC
  Administered 2016-02-25: 2 g via INTRAVENOUS

## 2016-02-25 MED ORDER — BUPIVACAINE HCL (PF) 0.5 % IJ SOLN
INTRAMUSCULAR | Status: DC | PRN
  Administered 2016-02-25: 30 mL

## 2016-02-25 MED ORDER — FAMOTIDINE 20 MG PO TABS
20.0000 mg | ORAL_TABLET | Freq: Once | ORAL | Status: AC
  Administered 2016-02-25: 20 mg via ORAL

## 2016-02-25 MED ORDER — FENTANYL CITRATE (PF) 100 MCG/2ML IJ SOLN: 25.0000 ug | INTRAMUSCULAR | Status: DC | PRN

## 2016-02-25 MED ORDER — LACTATED RINGERS IV SOLN
INTRAVENOUS | Status: DC
  Administered 2016-02-25 (×2): via INTRAVENOUS

## 2016-02-25 MED ORDER — ONDANSETRON HCL 4 MG/2ML IJ SOLN
INTRAMUSCULAR | Status: DC | PRN
  Administered 2016-02-25: 4 mg via INTRAVENOUS

## 2016-02-25 MED ORDER — FENTANYL CITRATE (PF) 100 MCG/2ML IJ SOLN
INTRAMUSCULAR | Status: DC | PRN
  Administered 2016-02-25 (×2): 50 ug via INTRAVENOUS

## 2016-02-25 MED ORDER — EPHEDRINE SULFATE 50 MG/ML IJ SOLN
INTRAMUSCULAR | Status: DC | PRN
  Administered 2016-02-25: 10 mg via INTRAVENOUS

## 2016-02-25 MED ORDER — MIDAZOLAM HCL 2 MG/2ML IJ SOLN
INTRAMUSCULAR | Status: DC | PRN
  Administered 2016-02-25: 2 mg via INTRAVENOUS

## 2016-02-25 MED ORDER — OXYMETAZOLINE HCL 0.05 % NA SOLN
NASAL | Status: DC | PRN
  Administered 2016-02-25 (×2): 2 via NASAL

## 2016-02-25 MED ORDER — LIDOCAINE HCL (CARDIAC) 20 MG/ML IV SOLN
INTRAVENOUS | Status: DC | PRN
  Administered 2016-02-25: 60 mg via INTRAVENOUS

## 2016-02-25 MED ORDER — PROPOFOL 10 MG/ML IV BOLUS
INTRAVENOUS | Status: DC | PRN
  Administered 2016-02-25: 250 mg via INTRAVENOUS

## 2016-02-25 MED ORDER — DEXMEDETOMIDINE HCL IN NACL 200 MCG/50ML IV SOLN
INTRAVENOUS | Status: DC | PRN
  Administered 2016-02-25 (×2): 8 ug via INTRAVENOUS

## 2016-02-25 MED ORDER — CHLORHEXIDINE GLUCONATE CLOTH 2 % EX PADS: 6.0000 | MEDICATED_PAD | Freq: Once | CUTANEOUS | Status: DC

## 2016-02-25 SURGICAL SUPPLY — 28 items
BLADE SURG 15 STRL SS SAFETY (BLADE) ×2 IMPLANT
CANISTER SUCT 1200ML W/VALVE (MISCELLANEOUS) ×2 IMPLANT
CHLORAPREP W/TINT 26ML (MISCELLANEOUS) ×2 IMPLANT
DECANTER SPIKE VIAL GLASS SM (MISCELLANEOUS) IMPLANT
DERMABOND ADVANCED (GAUZE/BANDAGES/DRESSINGS) ×1
DERMABOND ADVANCED .7 DNX12 (GAUZE/BANDAGES/DRESSINGS) ×1 IMPLANT
DRAPE LAPAROTOMY 100X77 ABD (DRAPES) ×2 IMPLANT
ELECT REM PT RETURN 9FT ADLT (ELECTROSURGICAL) ×2
ELECTRODE REM PT RTRN 9FT ADLT (ELECTROSURGICAL) ×1 IMPLANT
GLOVE BIO SURGEON STRL SZ7 (GLOVE) ×12 IMPLANT
GOWN STRL REUS W/ TWL LRG LVL3 (GOWN DISPOSABLE) ×3 IMPLANT
GOWN STRL REUS W/TWL LRG LVL3 (GOWN DISPOSABLE) ×3
KIT RM TURNOVER STRD PROC AR (KITS) ×2 IMPLANT
LABEL OR SOLS (LABEL) ×2 IMPLANT
LIQUID BAND (GAUZE/BANDAGES/DRESSINGS) IMPLANT
MESH VENTRALEX ST 2.5 CRC MED (Mesh General) ×2 IMPLANT
NEEDLE HYPO 25X1 1.5 SAFETY (NEEDLE) ×2 IMPLANT
NS IRRIG 500ML POUR BTL (IV SOLUTION) ×2 IMPLANT
PACK BASIN MINOR ARMC (MISCELLANEOUS) ×2 IMPLANT
SPONGE KITTNER 5P (MISCELLANEOUS) ×2 IMPLANT
SUT PROLENE 0 CT 2 (SUTURE) ×4 IMPLANT
SUT VIC AB 2-0 SH 27 (SUTURE)
SUT VIC AB 2-0 SH 27XBRD (SUTURE) IMPLANT
SUT VIC AB 3-0 SH 27 (SUTURE) ×1
SUT VIC AB 3-0 SH 27X BRD (SUTURE) ×1 IMPLANT
SUT VIC AB 4-0 FS2 27 (SUTURE) ×2 IMPLANT
SUT VICRYL+ 3-0 144IN (SUTURE) ×2 IMPLANT
SYR CONTROL 10ML (SYRINGE) ×2 IMPLANT

## 2016-02-25 NOTE — Anesthesia Procedure Notes (Signed)
Procedure Name: LMA Insertion Date/Time: 02/25/2016 10:05 AM Performed by: Tera HelperHARRIS, Tamarick Kovalcik ALLEN Pre-anesthesia Checklist: Patient identified, Emergency Drugs available, Suction available, Patient being monitored and Timeout performed Patient Re-evaluated:Patient Re-evaluated prior to inductionOxygen Delivery Method: Circle system utilized Preoxygenation: Pre-oxygenation with 100% oxygen Intubation Type: IV induction Ventilation: Mask ventilation with difficulty LMA: LMA inserted LMA Size: 5.0 Tube type: Oral Number of attempts: 2 Tube secured with: Tape

## 2016-02-25 NOTE — Transfer of Care (Signed)
Immediate Anesthesia Transfer of Care Note  Patient: Jesse MoodyJohn D Veltre  Procedure(s) Performed: Procedure(s): HERNIA REPAIR UMBILICAL ADULT (N/A)  Patient Location: PACU  Anesthesia Type:General  Level of Consciousness: awake  Airway & Oxygen Therapy: Patient connected to face mask oxygen  Post-op Assessment: Report given to RN  Post vital signs: stable  Last Vitals:  Vitals:   02/25/16 0843  BP: 127/77  Pulse: (!) 57  Resp: 16  Temp: 36.7 C    Last Pain:  Vitals:   02/25/16 0843  TempSrc: Tympanic         Complications: No apparent anesthesia complications

## 2016-02-25 NOTE — OR Nursing (Signed)
Dr. Evette CristalSankar into see pt and family.

## 2016-02-25 NOTE — H&P (View-Only) (Signed)
Patient ID: Jesse Wade, male   DOB: 1966/02/28, 50 y.o.   MRN: [id number]  Chief Complaint  Patient presents with  . Hernia    HPI Jesse Wade is a 50 y.o. male here today for a evaluation of a umbilical hernia. Patient states the discomfort bulging was noticed about 1-2 months ago. The discomfort is described as dull ache. He just started back going to the gym over the past 6-8 months and he has lost 30-35 pounds. I have reviewed the history of present illness with the patient.    HPI  Past Medical History:  Diagnosis Date  . Allergic rhinitis   . Hypertension     Past Surgical History:  Procedure Laterality Date  . ANKLE SURGERY Right 2014, 2015   x3  . INGUINAL HERNIA REPAIR Bilateral    childhood  . TONSILLECTOMY      Family History  Problem Relation Age of Onset  . Heart disease Mother   . Alcohol abuse Father   . Heart disease Maternal Aunt   . Heart disease Maternal Grandmother   . COPD Maternal Grandmother   . Diabetes Maternal Grandfather   . Cancer Neg Hx   . Hypertension Neg Hx   . Stroke Neg Hx     Social History Social History  Substance Use Topics  . Smoking status: Never Smoker  . Smokeless tobacco: Never Used  . Alcohol use No    Allergies  Allergen Reactions  . Amlodipine Other (See Comments)    Edema   . Penicillins Rash    Current Outpatient Prescriptions  Medication Sig Dispense Refill  . benazepril (LOTENSIN) 40 MG tablet Take 1 tablet (40 mg total) by mouth daily. 30 tablet 6  . levocetirizine (XYZAL) 5 MG tablet Take 5 mg by mouth every evening.   6   No current facility-administered medications for this visit.     Review of Systems Review of Systems  Constitutional: Negative.   Respiratory: Negative.   Cardiovascular: Negative.     Blood pressure 130/72, pulse 76, resp. rate 14, height 6\' 5"  (1.956 m), weight (!) 316 lb (143.3 kg).  Physical Exam Physical Exam  Constitutional: He is oriented to person, place, and  time. He appears well-developed and well-nourished.  HENT:  Mouth/Throat: Oropharynx is clear and moist.  Eyes: Conjunctivae are normal. No scleral icterus.  Neck: Neck supple.  Cardiovascular: Normal rate, regular rhythm and normal heart sounds.   Pulmonary/Chest: Effort normal and breath sounds normal.  Abdominal: Soft. Normal appearance and bowel sounds are normal. There is tenderness. A hernia is present. Hernia confirmed negative in the right inguinal area and confirmed negative in the left inguinal area.  3 cm umbilical hernia, partially reducible mildly tender.  Lymphadenopathy:    He has no cervical adenopathy.  Neurological: He is alert and oriented to person, place, and time.  Skin: Skin is warm and dry.  Psychiatric: His behavior is normal.    Data Reviewed Progress notes.  Assessment    3 cm umbilical hernia, partially reducible mildly tender. Recommended repair, discussed fully with pt.He is agreeable.    Plan    Hernia precautions and incarceration were discussed with the patient. If they develop symptoms of an incarcerated hernia, they were encouraged to seek prompt medical attention.  I have recommended repair of the hernia using mesh on an outpatient basis in the near future. The risk of infection was reviewed. The role of prosthetic mesh to minimize the risk of recurrence  was reviewed.  Plan on 1-2 weeks recovery time.     This information has been scribed by Dorathy DaftMarsha Hatch RN, BSN,BC.   Arabi Antolin G 02/18/2016, 4:46 PM

## 2016-02-25 NOTE — Anesthesia Postprocedure Evaluation (Signed)
Anesthesia Post Note  Patient: Jesse MoodyJohn D Wade  Procedure(s) Performed: Procedure(s) (LRB): HERNIA REPAIR UMBILICAL ADULT (N/A)  Patient location during evaluation: PACU Anesthesia Type: General Level of consciousness: awake and alert Pain management: pain level controlled Vital Signs Assessment: post-procedure vital signs reviewed and stable Respiratory status: spontaneous breathing, nonlabored ventilation, respiratory function stable and patient connected to nasal cannula oxygen Cardiovascular status: blood pressure returned to baseline and stable Postop Assessment: no signs of nausea or vomiting Anesthetic complications: no    Last Vitals:  Vitals:   02/25/16 1156 02/25/16 1222  BP: 108/65 110/68  Pulse: (!) 59 (!) 58  Resp: 16 16  Temp: 36.4 C     Last Pain:  Vitals:   02/25/16 1222  TempSrc:   PainSc: 3                  Yevette EdwardsJames G Levada Bowersox

## 2016-02-25 NOTE — Discharge Instructions (Signed)

## 2016-02-25 NOTE — Anesthesia Preprocedure Evaluation (Signed)
Anesthesia Evaluation  Patient identified by MRN, date of birth, ID band Patient awake    Reviewed: Allergy & Precautions, H&P , NPO status , Patient's Chart, lab work & pertinent test results, reviewed documented beta blocker date and time   Airway Mallampati: II  TM Distance: >3 FB Neck ROM: full    Dental  (+) Teeth Intact   Pulmonary neg pulmonary ROS, sleep apnea ,    Pulmonary exam normal        Cardiovascular hypertension, negative cardio ROS Normal cardiovascular exam Rhythm:regular Rate:Normal     Neuro/Psych  Headaches, negative neurological ROS  negative psych ROS   GI/Hepatic negative GI ROS, Neg liver ROS,   Endo/Other  negative endocrine ROS  Renal/GU negative Renal ROS  negative genitourinary   Musculoskeletal   Abdominal   Peds  Hematology negative hematology ROS (+)   Anesthesia Other Findings Past Medical History: No date: Allergic rhinitis No date: Headache     Comment: H/O MIGRAINES No date: Hypertension No date: Sleep apnea     Comment: BIPAP OR CPAP-PT UNSURE WHICH ONE Past Surgical History: 2014, 2015: ANKLE SURGERY Right     Comment: x3 No date: INGUINAL HERNIA REPAIR Bilateral     Comment: childhood No date: TONSILLECTOMY BMI    Body Mass Index:  37.47 kg/m     Reproductive/Obstetrics negative OB ROS                             Anesthesia Physical Anesthesia Plan  ASA: III  Anesthesia Plan: General ETT   Post-op Pain Management:    Induction:   Airway Management Planned:   Additional Equipment:   Intra-op Plan:   Post-operative Plan:   Informed Consent: I have reviewed the patients History and Physical, chart, labs and discussed the procedure including the risks, benefits and alternatives for the proposed anesthesia with the patient or authorized representative who has indicated his/her understanding and acceptance.   Dental Advisory  Given  Plan Discussed with: CRNA  Anesthesia Plan Comments:         Anesthesia Quick Evaluation

## 2016-02-25 NOTE — Interval H&P Note (Signed)
History and Physical Interval Note:  02/25/2016 9:58 AM  Jesse MoodyJohn D Pohlmann  has presented today for surgery, with the diagnosis of UMBILICAL HERNIA  The various methods of treatment have been discussed with the patient and family. After consideration of risks, benefits and other options for treatment, the patient has consented to  Procedure(s): HERNIA REPAIR UMBILICAL ADULT (N/A) as a surgical intervention .  The patient's history has been reviewed, patient examined, no change in status, stable for surgery.  I have reviewed the patient's chart and labs.  Questions were answered to the patient's satisfaction.     Yvonnia Tango G

## 2016-02-25 NOTE — Op Note (Signed)
Preop diagnosis: Umbilical hernia  Post op diagnosis: Same  Operation: Repair umbilical hernia with mesh  Surgeon: Kathreen CosierS. G. Bowen Goyal  Assistant:     Anesthesia: Gen.  Complications: None  EBL: Less than 5 mL  Drains: None  Description: Patient was put to sleep with an LMA and the umbilical area were prepped and draped as sterile field. Timeout was performed. Surrounding the umbilicus and 0.5% Marcaine was instilled initially 20 mL and subsequently an additional 10 ml. The hernia was located on the right edge of the umbilicus. An incision was made encircling this on the right side and extended about the 5 mm superiorly and inferiorly. The subcutaneous tissue was entered and the hernial protrusion was identified which was then circumferentially freed from the surrounding subcutaneous tissue and the fascial edges reached.. It appeared that the hernia was predominantly of preperitoneal fatty protrusion with a very small sac containing no contents. The hernial protrusion was freed along the edges of the hernial defect and thereafter the fascial edges were lifted up to allow clearing of the preperitoneal space surrounding the fascial defect. An approximately is 7-8 cm preperitoneal space was created to allow placement of mesh. A 6 cm size Ventralex ST mesh with the central straps was brought up. The mesh was then positioned in the preperitoneal space and lifted up with the central straps and the edges were smoothed out in the preperitoneal space. The fascial defect measured approximately 2.5-3 cm in size and this was approximated with interrupted sutures of 0 proline incorporating part of the central strap. Axis of the strap was excised out. The wound was irrigated and then closed subcutaneous tissue closed with interrupted 3-0 Vicryl. Skin was closed with subcuticular 4-0 Vicryl and the skin covered with Dermabond. Procedure was well-tolerated and he was subsequently extubated and returned recovery room  stable condition

## 2016-02-27 ENCOUNTER — Encounter: Payer: Self-pay | Admitting: *Deleted

## 2016-03-03 ENCOUNTER — Encounter: Payer: Self-pay | Admitting: General Surgery

## 2016-03-03 ENCOUNTER — Ambulatory Visit (INDEPENDENT_AMBULATORY_CARE_PROVIDER_SITE_OTHER): Payer: 59 | Admitting: General Surgery

## 2016-03-03 VITALS — BP 130/80 | HR 80 | Resp 12 | Ht 77.0 in | Wt 317.0 lb

## 2016-03-03 DIAGNOSIS — K429 Umbilical hernia without obstruction or gangrene: Secondary | ICD-10-CM

## 2016-03-03 NOTE — Progress Notes (Signed)
Patient ID: Jesse Wade, male   DOB: 08/05/1965, 50 y.o.   MRN: [id number]  Chief Complaint  Patient presents with  . Routine Post Op    Umbilical hernia repair    HPI Jesse Wade is a 50 y.o. male here today for his post op umbilical hernia repair done on 02/25/16. Patient states he is doing well. He reports the area below his incision where his hair was removed has been very itchy the last few days and he has noticed some redness develop today. Otherwise denies any fevers, warmth, or drainage.   I have reviewed the history of present illness with the patient.  HPI  Past Medical History:  Diagnosis Date  . Allergic rhinitis   . Headache    H/O MIGRAINES  . Hypertension   . Sleep apnea    BIPAP OR CPAP-PT UNSURE WHICH ONE    Past Surgical History:  Procedure Laterality Date  . ANKLE SURGERY Right 2014, 2015   x3  . INGUINAL HERNIA REPAIR Bilateral    childhood  . TONSILLECTOMY    . UMBILICAL HERNIA REPAIR N/A 02/25/2016   Procedure: HERNIA REPAIR UMBILICAL ADULT;  Surgeon: Kieth Brightly, MD;  Location: [facility] ORS;  Service: General;  Laterality: N/A;    Family History  Problem Relation Age of Onset  . Heart disease Mother   . Alcohol abuse Father   . Heart disease Maternal Aunt   . Heart disease Maternal Grandmother   . COPD Maternal Grandmother   . Diabetes Maternal Grandfather   . Cancer Neg Hx   . Hypertension Neg Hx   . Stroke Neg Hx     Social History Social History  Substance Use Topics  . Smoking status: Never Smoker  . Smokeless tobacco: Never Used  . Alcohol use Yes     Comment: OCC    Allergies  Allergen Reactions  . Amlodipine Other (See Comments)    Edema   . Penicillins Rash    Current Outpatient Prescriptions  Medication Sig Dispense Refill  . benazepril (LOTENSIN) 40 MG tablet Take 1 tablet (40 mg total) by mouth daily. (Patient taking differently: Take 40 mg by mouth every morning. ) 30 tablet 6  . levocetirizine (XYZAL) 5 MG  tablet Take 5 mg by mouth every evening.   6  . oxyCODONE-acetaminophen (ROXICET) 5-325 MG tablet Take 1 tablet by mouth every 4 (four) hours as needed. 30 tablet 0   No current facility-administered medications for this visit.     Review of Systems Review of Systems  Constitutional: Negative.   Respiratory: Negative.   Cardiovascular: Negative.     Blood pressure 130/80, pulse 80, resp. rate 12, height 6\' 5"  (1.956 m), weight (!) 317 lb (143.8 kg).  Physical Exam Physical Exam  Constitutional: He is oriented to person, place, and time. He appears well-developed and well-nourished.  Abdominal: Soft. Bowel sounds are normal.  Umbilical hernia repair intact and healing well. Mild rash about incision site.   Neurological: He is alert and oriented to person, place, and time.  Skin: Skin is warm and dry.    Data Reviewed Notes reviewed.  Assessment    Umbilical hernia repair intact. Incision is clean, dry, and intact without signs of infection. Pruritis above and below the umbilicus with mild erythema is benign and not consistent with infectious process.     Plan      Patient to return in one month for follow up. Instructed to apply OTC zinc oxide  to rash May return to work tomorrow, avoid extreme exertion for another 3 weeks. This information has been scribed by Ples SpecterJessica Qualls CMA.    Zilah Villaflor G 03/03/2016, 3:23 PM

## 2016-03-03 NOTE — Patient Instructions (Addendum)
Return in six weeks

## 2016-03-04 ENCOUNTER — Encounter: Payer: Self-pay | Admitting: General Surgery

## 2016-04-07 ENCOUNTER — Encounter: Payer: Self-pay | Admitting: General Surgery

## 2016-04-07 ENCOUNTER — Ambulatory Visit (INDEPENDENT_AMBULATORY_CARE_PROVIDER_SITE_OTHER): Payer: 59 | Admitting: General Surgery

## 2016-04-07 VITALS — BP 124/72 | HR 58 | Resp 12 | Ht 77.0 in | Wt 315.0 lb

## 2016-04-07 DIAGNOSIS — K429 Umbilical hernia without obstruction or gangrene: Secondary | ICD-10-CM

## 2016-04-07 NOTE — Progress Notes (Signed)
Patient ID: Jesse MoodyJohn D Chiappetta, male   DOB: 22-Jul-1965, 50 y.o.   MRN: [id number]  Chief Complaint  Patient presents with  . Routine Post Op    umbilical hernia    HPI Jesse MoodyJohn D England is a 50 y.o. male here today for his postoperative visit, no complaints. He had repair of umbilical hernia with mesh on 02/25/16.  No GI issues. I have reviewed the history of present illness with the patient.  HPI  Past Medical History:  Diagnosis Date  . Allergic rhinitis   . Headache    H/O MIGRAINES  . Hypertension   . Sleep apnea    BIPAP OR CPAP-PT UNSURE WHICH ONE    Past Surgical History:  Procedure Laterality Date  . ANKLE SURGERY Right 2014, 2015   x3  . INGUINAL HERNIA REPAIR Bilateral    childhood  . TONSILLECTOMY    . UMBILICAL HERNIA REPAIR N/A 02/25/2016   Procedure: HERNIA REPAIR UMBILICAL ADULT;  Surgeon: Kieth BrightlySeeplaputhur G Jaleya Pebley, MD;  Location: [facility] ORS;  Service: General;  Laterality: N/A;    Family History  Problem Relation Age of Onset  . Heart disease Mother   . Alcohol abuse Father   . Heart disease Maternal Aunt   . Heart disease Maternal Grandmother   . COPD Maternal Grandmother   . Diabetes Maternal Grandfather   . Cancer Neg Hx   . Hypertension Neg Hx   . Stroke Neg Hx     Social History Social History  Substance Use Topics  . Smoking status: Never Smoker  . Smokeless tobacco: Never Used  . Alcohol use Yes     Comment: OCC    Allergies  Allergen Reactions  . Amlodipine Other (See Comments)    Edema   . Penicillins Rash    Current Outpatient Prescriptions  Medication Sig Dispense Refill  . benazepril (LOTENSIN) 40 MG tablet Take 1 tablet (40 mg total) by mouth daily. (Patient taking differently: Take 40 mg by mouth every morning. ) 30 tablet 6  . levocetirizine (XYZAL) 5 MG tablet Take 5 mg by mouth every evening.   6   No current facility-administered medications for this visit.     Review of Systems Review of Systems  Constitutional: Negative.    Respiratory: Negative.   Cardiovascular: Negative.     Blood pressure 124/72, pulse (!) 58, resp. rate 12, height 6\' 5"  (1.956 m), weight (!) 315 lb (142.9 kg).  Physical Exam Physical Exam  Constitutional: He is oriented to person, place, and time. He appears well-developed and well-nourished.  Abdominal: Soft. Normal appearance and bowel sounds are normal. There is no tenderness.  Umbilical hernia repair intact  Neurological: He is alert and oriented to person, place, and time.  Skin: Skin is warm and dry.  Psychiatric: His behavior is normal.    Data Reviewed  None  Assessment    Recovered fully post umbilical hernia repair    Plan      Follow uo up as needed. Proper lifting techniques reviewed. Resume activities as tolerated.    This information has been scribed by Dorathy DaftMarsha Hatch RN, BSN,BC.    Ott Zimmerle G 04/09/2016, 8:23 AM

## 2016-04-07 NOTE — Patient Instructions (Signed)
The patient is aware to call back for any questions or concerns.  

## 2016-04-09 ENCOUNTER — Encounter: Payer: Self-pay | Admitting: General Surgery

## 2016-04-29 ENCOUNTER — Ambulatory Visit (INDEPENDENT_AMBULATORY_CARE_PROVIDER_SITE_OTHER): Payer: 59 | Admitting: Family Medicine

## 2016-04-29 ENCOUNTER — Encounter: Payer: Self-pay | Admitting: Family Medicine

## 2016-04-29 VITALS — BP 113/71 | HR 60 | Temp 97.8°F | Wt 314.0 lb

## 2016-04-29 DIAGNOSIS — H66001 Acute suppurative otitis media without spontaneous rupture of ear drum, right ear: Secondary | ICD-10-CM | POA: Diagnosis not present

## 2016-04-29 DIAGNOSIS — J3089 Other allergic rhinitis: Secondary | ICD-10-CM | POA: Diagnosis not present

## 2016-04-29 MED ORDER — AZITHROMYCIN 250 MG PO TABS: ORAL_TABLET | ORAL | 0 refills | Status: DC

## 2016-04-29 MED ORDER — MONTELUKAST SODIUM 10 MG PO TABS: 10.0000 mg | ORAL_TABLET | Freq: Every day | ORAL | 3 refills | Status: AC

## 2016-04-29 NOTE — Assessment & Plan Note (Addendum)
Continue xyzal and flonase. Add singulair.

## 2016-04-29 NOTE — Progress Notes (Signed)
BP 113/71   Pulse 60   Temp 97.8 F (36.6 C)   Wt (!) 314 lb (142.4 kg)   SpO2 98%   BMI 37.23 kg/m    Subjective:    Patient ID: Jesse Wade, male    DOB: Dec 12, 1965, 50 y.o.   MRN: [id number]  HPI: Jesse Wade is a 50 y.o. male  Chief Complaint  Patient presents with  . Sinusitis    x 1 day, sinus issues, head congestion, sneezing, runny nose, right ear pain. No fever, no chest congestion, no cough   Patient presents with 1 day history of right ear pain, pressure, and muffled hearing. Has severe allergies, and started yesterday morning with sinus congestion, sneezing, rhinorrhea. Over the course of the day, the ear pain started and was severe overnight. Taking xyzal regularly, flonase intermittently. No fever, chills, sore throat, cough, SOB, CP.    Past Medical History:  Diagnosis Date  . Allergic rhinitis   . Headache    H/O MIGRAINES  . Hypertension   . Sleep apnea    BIPAP OR CPAP-PT UNSURE WHICH ONE   Social History   Social History  . Marital status: Married    Spouse name: N/A  . Number of children: N/A  . Years of education: N/A   Occupational History  . Not on file.   Social History Main Topics  . Smoking status: Never Smoker  . Smokeless tobacco: Never Used  . Alcohol use Yes     Comment: OCC  . Drug use: No  . Sexual activity: Not on file   Other Topics Concern  . Not on file   Social History Narrative  . No narrative on file     Relevant past medical, surgical, family and social history reviewed and updated as indicated. Interim medical history since our last visit reviewed. Allergies and medications reviewed and updated.  Review of Systems  Constitutional: Negative.   HENT: Positive for ear pain, hearing loss, rhinorrhea, sinus pressure and sneezing.   Eyes: Negative.   Respiratory: Negative.   Gastrointestinal: Negative.   Genitourinary: Negative.   Musculoskeletal: Negative.   Skin: Negative.   Neurological: Negative.     Psychiatric/Behavioral: Negative.     Per HPI unless specifically indicated above     Objective:    BP 113/71   Pulse 60   Temp 97.8 F (36.6 C)   Wt (!) 314 lb (142.4 kg)   SpO2 98%   BMI 37.23 kg/m   Wt Readings from Last 3 Encounters:  04/29/16 (!) 314 lb (142.4 kg)  04/07/16 (!) 315 lb (142.9 kg)  03/03/16 (!) 317 lb (143.8 kg)    Physical Exam  Constitutional: He is oriented to person, place, and time. He appears well-developed and well-nourished.  HENT:  Head: Normocephalic and atraumatic.  Left Ear: External ear normal.  Nose: Nose normal.  Mouth/Throat: Oropharynx is clear and moist. No oropharyngeal exudate.  Right TM bulging and erythematous with moderate purulent effusion  Eyes: Conjunctivae are normal. Pupils are equal, round, and reactive to light. No scleral icterus.  Neck: Normal range of motion. Neck supple.  Cardiovascular: Normal rate and normal heart sounds.   Pulmonary/Chest: Effort normal. No respiratory distress.  Musculoskeletal: Normal range of motion.  Neurological: He is alert and oriented to person, place, and time.  Skin: Skin is warm and dry.  Psychiatric: He has a normal mood and affect. His behavior is normal.  Nursing note and vitals reviewed.  Assessment & Plan:   Problem List Items Addressed This Visit      Respiratory   Chronic non-seasonal allergic rhinitis    Continue xyzal and flonase. Add singulair.        Other Visit Diagnoses    Acute suppurative otitis media of right ear without spontaneous rupture of tympanic membrane, recurrence not specified    -  Primary   Azithromycin sent. Continue flonase, and take ibuprofen prn for pain relief.    Relevant Medications   azithromycin (ZITHROMAX) 250 MG tablet       Follow up plan: Return if symptoms worsen or fail to improve.

## 2016-04-29 NOTE — Patient Instructions (Signed)
Follow up as needed

## 2016-07-03 ENCOUNTER — Encounter: Payer: Self-pay | Admitting: Family Medicine

## 2016-07-03 ENCOUNTER — Ambulatory Visit (INDEPENDENT_AMBULATORY_CARE_PROVIDER_SITE_OTHER): Payer: 59 | Admitting: Family Medicine

## 2016-07-03 VITALS — BP 142/88 | HR 57 | Temp 98.3°F | Wt 326.9 lb

## 2016-07-03 DIAGNOSIS — H66001 Acute suppurative otitis media without spontaneous rupture of ear drum, right ear: Secondary | ICD-10-CM | POA: Diagnosis not present

## 2016-07-03 MED ORDER — AZITHROMYCIN 250 MG PO TABS: ORAL_TABLET | ORAL | 0 refills | Status: DC

## 2016-07-03 NOTE — Progress Notes (Signed)
BP (!) 142/88 (BP Location: Left Arm, Patient Position: Sitting, Cuff Size: Large)   Pulse (!) 57   Temp 98.3 F (36.8 C)   Wt (!) 326 lb 14.4 oz (148.3 kg)   SpO2 98%   BMI 38.76 kg/m    Subjective:    Patient ID: Jesse MoodyJohn D Wade, male    DOB: 05-31-1966, 51 y.o.   MRN: [id number]  HPI: Jesse Wade is a 51 y.o. male  Chief Complaint  Patient presents with  . Nasal Congestion    X 1 day, sinus pressure and pain. left ear clogged.    UPPER RESPIRATORY TRACT INFECTION Duration: 1 day Worst symptom: clogged ear Fever: no Cough: no Shortness of breath: no Wheezing: no Chest pain: no Chest tightness: no Chest congestion: no Nasal congestion: yes Runny nose: yes Post nasal drip: no Sneezing: no Sore throat: no Swollen glands: no Sinus pressure: yes Headache: no Face pain: no Toothache: no Ear pain: no  Ear pressure: yes "right Eyes red/itching:yes Eye drainage/crusting: no  Vomiting: no Rash: no Fatigue: no Sick contacts: no Strep contacts: no  Context: worse Recurrent sinusitis: no Relief with OTC cold/cough medications: no  Treatments attempted: cold/sinus and pseudoephedrine   Relevant past medical, surgical, family and social history reviewed and updated as indicated. Interim medical history since our last visit reviewed. Allergies and medications reviewed and updated.  Review of Systems  Constitutional: Negative.   HENT: Positive for congestion, ear pain and hearing loss. Negative for dental problem, drooling, ear discharge, facial swelling, mouth sores, nosebleeds, postnasal drip, rhinorrhea, sinus pain, sinus pressure, sneezing, sore throat, tinnitus, trouble swallowing and voice change.   Respiratory: Negative.   Cardiovascular: Negative.   Psychiatric/Behavioral: Negative.     Per HPI unless specifically indicated above     Objective:    BP (!) 142/88 (BP Location: Left Arm, Patient Position: Sitting, Cuff Size: Large)   Pulse (!) 57   Temp  98.3 F (36.8 C)   Wt (!) 326 lb 14.4 oz (148.3 kg)   SpO2 98%   BMI 38.76 kg/m   Wt Readings from Last 3 Encounters:  07/03/16 (!) 326 lb 14.4 oz (148.3 kg)  04/29/16 (!) 314 lb (142.4 kg)  04/07/16 (!) 315 lb (142.9 kg)    Physical Exam  Constitutional: He is oriented to person, place, and time. He appears well-developed and well-nourished. No distress.  HENT:  Head: Normocephalic and atraumatic.  Right Ear: Hearing, external ear and ear canal normal. Tympanic membrane is erythematous and bulging. A middle ear effusion is present.  Left Ear: Hearing, tympanic membrane, external ear and ear canal normal.  Nose: Nose normal.  Mouth/Throat: Oropharynx is clear and moist. No oropharyngeal exudate.  Eyes: Conjunctivae, EOM and lids are normal. Pupils are equal, round, and reactive to light. Right eye exhibits no discharge. Left eye exhibits no discharge. No scleral icterus.  Neck: Normal range of motion. Neck supple. No JVD present. No tracheal deviation present. No thyromegaly present.  Cardiovascular: Normal rate, regular rhythm, normal heart sounds and intact distal pulses.  Exam reveals no gallop and no friction rub.   No murmur heard. Pulmonary/Chest: Effort normal and breath sounds normal. No stridor. No respiratory distress. He has no wheezes. He has no rales. He exhibits no tenderness.  Musculoskeletal: Normal range of motion.  Lymphadenopathy:    He has no cervical adenopathy.  Neurological: He is alert and oriented to person, place, and time.  Skin: Skin is warm, dry and intact.  No rash noted. He is not diaphoretic. No erythema. No pallor.  Psychiatric: He has a normal mood and affect. His speech is normal and behavior is normal. Judgment and thought content normal. Cognition and memory are normal.  Nursing note and vitals reviewed.   Results for orders placed or performed in visit on 06/18/15  Lipid Panel w/o Chol/HDL Ratio  Result Value Ref Range   Cholesterol, Total 146  100 - 199 mg/dL   Triglycerides 82 0 - 149 mg/dL   HDL 40 >16 mg/dL   VLDL Cholesterol Cal 16 5 - 40 mg/dL   LDL Calculated 90 0 - 99 mg/dL  PSA  Result Value Ref Range   Prostate Specific Ag, Serum 0.5 0.0 - 4.0 ng/mL  Comprehensive metabolic panel  Result Value Ref Range   Glucose 97 65 - 99 mg/dL   BUN 12 6 - 24 mg/dL   Creatinine, Ser 1.09 0.76 - 1.27 mg/dL   GFR calc non Af Amer 99 >59 mL/min/1.73   GFR calc Af Amer 114 >59 mL/min/1.73   BUN/Creatinine Ratio 13 9 - 20   Sodium 143 134 - 144 mmol/L   Potassium 4.3 3.5 - 5.2 mmol/L   Chloride 104 96 - 106 mmol/L   CO2 24 18 - 29 mmol/L   Calcium 9.2 8.7 - 10.2 mg/dL   Total Protein 6.4 6.0 - 8.5 g/dL   Albumin 4.0 3.5 - 5.5 g/dL   Globulin, Total 2.4 1.5 - 4.5 g/dL   Albumin/Globulin Ratio 1.7 1.1 - 2.5   Bilirubin Total 0.3 0.0 - 1.2 mg/dL   Alkaline Phosphatase 85 39 - 117 IU/L   AST 22 0 - 40 IU/L   ALT 28 0 - 44 IU/L      Assessment & Plan:   Problem List Items Addressed This Visit    None    Visit Diagnoses    Acute suppurative otitis media of right ear without spontaneous rupture of tympanic membrane, recurrence not specified    -  Primary   Will treat with azithromycin due to allergy. Will call if not getting better or getting worse.    Relevant Medications   azithromycin (ZITHROMAX) 250 MG tablet       Follow up plan: Return if symptoms worsen or fail to improve.

## 2016-07-03 NOTE — Patient Instructions (Addendum)
Otitis Media, Adult Otitis media is redness, soreness, and inflammation of the middle ear. Otitis media may be caused by allergies or, most commonly, by infection. Often it occurs as a complication of the common cold. What are the signs or symptoms? Symptoms of otitis media may include:  Earache.  Fever.  Ringing in your ear.  Headache.  Leakage of fluid from the ear. How is this diagnosed? To diagnose otitis media, your health care provider will examine your ear with an otoscope. This is an instrument that allows your health care provider to see into your ear in order to examine your eardrum. Your health care provider also will ask you questions about your symptoms. How is this treated? Typically, otitis media resolves on its own within 3-5 days. Your health care provider may prescribe medicine to ease your symptoms of pain. If otitis media does not resolve within 5 days or is recurrent, your health care provider may prescribe antibiotic medicines if he or she suspects that a bacterial infection is the cause. Follow these instructions at home:  If you were prescribed an antibiotic medicine, finish it all even if you start to feel better.  Take medicines only as directed by your health care provider.  Keep all follow-up visits as directed by your health care provider. Contact a health care provider if:  You have otitis media only in one ear, or bleeding from your nose, or both.  You notice a lump on your neck.  You are not getting better in 3-5 days.  You feel worse instead of better. Get help right away if:  You have pain that is not controlled with medicine.  You have swelling, redness, or pain around your ear or stiffness in your neck.  You notice that part of your face is paralyzed.  You notice that the bone behind your ear (mastoid) is tender when you touch it. This information is not intended to replace advice given to you by your health care provider. Make sure you  discuss any questions you have with your health care provider. Document Released: 03/13/2004 Document Revised: 11/14/2015 Document Reviewed: 01/03/2013 Elsevier Interactive Patient Education  2017 [employer]  

## 2016-09-26 ENCOUNTER — Other Ambulatory Visit: Payer: Self-pay | Admitting: Family Medicine

## 2016-09-28 NOTE — Telephone Encounter (Signed)
Last OV: 07/03/16 Next OV: None on file.   BMP Latest Ref Rng & Units 06/18/2015  Glucose 65 - 99 mg/dL 97  BUN 6 - 24 mg/dL 12  Creatinine 1.61 - 0.96 mg/dL 0.45  BUN/Creat Ratio 9 - 20 13  Sodium 134 - 144 mmol/L 143  Potassium 3.5 - 5.2 mmol/L 4.3  Chloride 96 - 106 mmol/L 104  CO2 18 - 29 mmol/L 24  Calcium 8.7 - 10.2 mg/dL 9.2

## 2016-10-05 ENCOUNTER — Encounter: Payer: Self-pay | Admitting: Family Medicine

## 2016-10-05 ENCOUNTER — Ambulatory Visit (INDEPENDENT_AMBULATORY_CARE_PROVIDER_SITE_OTHER): Payer: BLUE CROSS/BLUE SHIELD | Admitting: Family Medicine

## 2016-10-05 VITALS — BP 111/70 | HR 55 | Temp 98.5°F | Wt 328.8 lb

## 2016-10-05 DIAGNOSIS — I872 Venous insufficiency (chronic) (peripheral): Secondary | ICD-10-CM

## 2016-10-05 NOTE — Patient Instructions (Signed)
Follow up as needed
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
## 2016-10-06 ENCOUNTER — Encounter: Payer: BLUE CROSS/BLUE SHIELD | Attending: Internal Medicine | Admitting: Internal Medicine

## 2016-10-06 DIAGNOSIS — I87333 Chronic venous hypertension (idiopathic) with ulcer and inflammation of bilateral lower extremity: Secondary | ICD-10-CM | POA: Diagnosis not present

## 2016-10-06 DIAGNOSIS — L97821 Non-pressure chronic ulcer of other part of left lower leg limited to breakdown of skin: Secondary | ICD-10-CM | POA: Diagnosis not present

## 2016-10-06 DIAGNOSIS — I872 Venous insufficiency (chronic) (peripheral): Secondary | ICD-10-CM | POA: Diagnosis not present

## 2016-10-06 DIAGNOSIS — I10 Essential (primary) hypertension: Secondary | ICD-10-CM | POA: Insufficient documentation

## 2016-10-06 DIAGNOSIS — L97811 Non-pressure chronic ulcer of other part of right lower leg limited to breakdown of skin: Secondary | ICD-10-CM | POA: Insufficient documentation

## 2016-10-07 NOTE — Progress Notes (Signed)
RITVIK, MCZEAL ([id number]) Visit Report for 10/06/2016 Abuse/Suicide Risk Screen Details Patient Name: Jesse Wade, Jesse Wade. Date of Service: 10/06/2016 12:45 PM Medical Record Patient Account Number: [id number] Number: Treating RN: Clover Mealy, RN, BSN, Rita 08-Jul-1965 (50 y.o. Other Clinician: Date of Birth/Sex: Male) Treating ROBSON, MICHAEL Primary Care Jawan Chavarria: Vonita Moss Lakea Mittelman/Extender: G Referring Eriona Kinchen: Vonita Moss Weeks in Treatment: 0 Abuse/Suicide Risk Screen Items Answer ABUSE/SUICIDE RISK SCREEN: Has anyone close to you tried to hurt or harm you recentlyo No Do you feel uncomfortable with anyone in your familyo No Has anyone forced you do things that you didnot want to doo No Do you have any thoughts of harming yourselfo No Patient displays signs or symptoms of abuse and/or neglect. No Electronic Signature(s) Signed: 10/06/2016 4:51:03 PM By: Elpidio Eric BSN, RN Entered By: Elpidio Eric on 10/06/2016 13:32:25 Jesse Wade ([id number]) -------------------------------------------------------------------------------- Activities of Daily Living Details Patient Name: Jesse Wade, Jesse Wade. Date of Service: 10/06/2016 12:45 PM Medical Record Patient Account Number: [id number] Number: Treating RN: Clover Mealy, RN, BSN, Rita 26-Apr-1966 (50 y.o. Other Clinician: Date of Birth/Sex: Male) Treating ROBSON, MICHAEL Primary Care Sol Englert: Vonita Moss Aira Sallade/Extender: G Referring Jaia Alonge: Vonita Moss Weeks in Treatment: 0 Activities of Daily Living Items Answer Activities of Daily Living (Please select one for each item) Drive Automobile Completely Able Take Medications Completely Able Use Telephone Completely Able Care for Appearance Completely Able Use Toilet Completely Able Bath / Shower Completely Able Dress Self Completely Able Feed Self Completely Able Walk Completely Able Get In / Out Bed Completely Able Housework Completely Able Prepare Meals  Completely Able Handle Money Completely Able Shop for Self Completely Able Electronic Signature(s) Signed: 10/06/2016 4:51:03 PM By: Elpidio Eric BSN, RN Entered By: Elpidio Eric on 10/06/2016 13:32:45 Jesse Wade ([id number]) -------------------------------------------------------------------------------- Education Assessment Details Patient Name: Jesse Wade. Date of Service: 10/06/2016 12:45 PM Medical Record Patient Account Number: [id number] Number: Treating RN: Clover Mealy, RN, BSN, Rita Sep 18, 1965 (50 y.o. Other Clinician: Date of Birth/Sex: Male) Treating ROBSON, MICHAEL Primary Care Kensi Karr: Vonita Moss Ibrohim Simmers/Extender: G Referring Blaze Sandin: Vonita Moss Weeks in Treatment: 0 Primary Learner Assessed: Patient Learning Preferences/Education Level/Primary Language Learning Preference: Explanation Highest Education Level: High School Preferred Language: English Cognitive Barrier Assessment/Beliefs Language Barrier: No Physical Barrier Assessment Impaired Vision: No Impaired Hearing: No Decreased Hand dexterity: No Knowledge/Comprehension Assessment Knowledge Level: High Comprehension Level: High Ability to understand written High instructions: Ability to understand verbal High instructions: Motivation Assessment Anxiety Level: Calm Cooperation: Cooperative Education Importance: Acknowledges Need Interest in Health Problems: Asks Questions Perception: Coherent Willingness to Engage in Self- High Management Activities: Readiness to Engage in Self- High Management Activities: Electronic Signature(s) Signed: 10/06/2016 4:51:03 PM By: Elpidio Eric BSN, RN Entered By: Elpidio Eric on 10/06/2016 13:33:08 Jesse Wade ([id number]) Jesse Wade, Jesse Wade ([id number]) -------------------------------------------------------------------------------- Fall Risk Assessment Details Patient Name: Jesse Wade. Date of Service: 10/06/2016 12:45 PM Medical Record  Patient Account Number: [id number] Number: Treating RN: Clover Mealy, RN, BSN, Rita Oct 27, 1965 (50 y.o. Other Clinician: Date of Birth/Sex: Male) Treating ROBSON, MICHAEL Primary Care Jermell Holeman: Vonita Moss Donterius Filley/Extender: G Referring Wells Mabe: Vonita Moss Weeks in Treatment: 0 Fall Risk Assessment Items Have you had 2 or more falls in the last 12 monthso 0 No Have you had any fall that resulted in injury in the last 12 monthso 0 No FALL RISK ASSESSMENT: History of falling - immediate or within 3 months 0 No Secondary diagnosis 0 No Ambulatory aid None/bed rest/wheelchair/nurse 0 Yes Crutches/cane/walker 0 No  Furniture 0 No IV Access/Saline Lock 0 No Gait/Training Normal/bed rest/immobile 0 Yes Weak 0 No Impaired 0 No Mental Status Oriented to own ability 0 Yes Electronic Signature(s) Signed: 10/06/2016 4:51:03 PM By: Elpidio Eric BSN, RN Entered By: Elpidio Eric on 10/06/2016 13:58:17 Jesse Wade ([id number]) -------------------------------------------------------------------------------- Foot Assessment Details Patient Name: Jesse Wade. Date of Service: 10/06/2016 12:45 PM Medical Record Patient Account Number: [id number] Number: Treating RN: Clover Mealy, RN, BSN, Rita [birth year] (50 y.o. Other Clinician: Date of Birth/Sex: Male) Treating ROBSON, MICHAEL Primary Care Coreon Simkins: Vonita Moss Navin Dogan/Extender: G Referring Foye Damron: Vonita Moss Weeks in Treatment: 0 Foot Assessment Items Site Locations + = Sensation present, - = Sensation absent, C = Callus, U = Ulcer R = Redness, W = Warmth, M = Maceration, PU = Pre-ulcerative lesion F = Fissure, S = Swelling, D = Dryness Assessment Right: Left: Other Deformity: No No Prior Foot Ulcer: No No Prior Amputation: No No Charcot Joint: No No Ambulatory Status: Ambulatory Without Help Gait: Steady Electronic Signature(s) Signed: 10/06/2016 4:51:03 PM By: Elpidio Eric BSN, RN Entered By: Elpidio Eric on 10/06/2016 13:59:01 Jesse Wade ([id number]) -------------------------------------------------------------------------------- Nutrition Risk Assessment Details Patient Name: Jesse Wade. Date of Service: 10/06/2016 12:45 PM Medical Record Patient Account Number: [id number] Number: Treating RN: Clover Mealy, RN, BSN, Rita January 18, 1966 (50 y.o. Other Clinician: Date of Birth/Sex: Male) Treating ROBSON, MICHAEL Primary Care Gwendelyn Lanting: Vonita Moss Malajah Oceguera/Extender: G Referring Chanon Loney: Vonita Moss Weeks in Treatment: 0 Height (in): 78 Weight (lbs): 332 Body Mass Index (BMI): 38.4 Nutrition Risk Assessment Items NUTRITION RISK SCREEN: I have an illness or condition that made me change the kind and/or 0 No amount of food I eat I eat fewer than two meals per day 0 No I eat few fruits and vegetables, or milk products 0 No I have three or more drinks of beer, liquor or wine almost every day 0 No I have tooth or mouth problems that make it hard for me to eat 0 No I don't always have enough money to buy the food I need 0 No I eat alone most of the time 0 No I take three or more different prescribed or over-the-counter drugs a 0 No day Without wanting to, I have lost or gained 10 pounds in the last six 0 No months I am not always physically able to shop, cook and/or feed myself 0 No Nutrition Protocols Good Risk Protocol 0 No interventions needed Moderate Risk Protocol Electronic Signature(s) Signed: 10/06/2016 4:51:03 PM By: Elpidio Eric BSN, RN Entered By: Elpidio Eric on 10/06/2016 13:58:26

## 2016-10-08 NOTE — Progress Notes (Signed)
Jesse, Wade ([id number]) Visit Report for 10/06/2016 Allergy List Details Patient Name: Jesse Wade, Jesse Wade. Date of Service: 10/06/2016 12:45 PM Medical Record Number: [id number] Patient Account Number: [id number] Date of Birth/Sex: 09/24/1965 (51 y.o. Male) Treating RN: Afful, RN, BSN, [place] Sink Primary Care Kash Davie: Vonita Moss Other Clinician: Referring Zeph Riebel: Vonita Moss Treating Mahreen Schewe/Extender: Maxwell Caul Weeks in Treatment: 0 Allergies Active Allergies Seasonal allergies PCN Allergy Notes Electronic Signature(s) Signed: 10/06/2016 4:51:03 PM By: Elpidio Eric BSN, RN Entered By: Elpidio Eric on 10/06/2016 13:52:06 Jesse Wade ([id number]) -------------------------------------------------------------------------------- Arrival Information Details Patient Name: Jesse Wade. Date of Service: 10/06/2016 12:45 PM Medical Record Number: [id number] Patient Account Number: [id number] Date of Birth/Sex: [birth year] (51 y.o. Male) Treating RN: Afful, RN, BSN, [place] Sink Primary Care Donta Fuster: Vonita Moss Other Clinician: Referring Anil Havard: Vonita Moss Treating Lajuan Godbee/Extender: Altamese [place] in Treatment: 0 Visit Information Patient Arrived: Ambulatory Arrival Time: 13:26 Accompanied By: wife Transfer Assistance: None Patient Identification Verified: Yes Secondary Verification Process Yes Completed: Patient Requires Transmission-Based No Precautions: Patient Has Alerts: No Electronic Signature(s) Signed: 10/06/2016 4:51:03 PM By: Elpidio Eric BSN, RN Entered By: Elpidio Eric on 10/06/2016 13:26:42 Jesse Wade ([id number]) -------------------------------------------------------------------------------- Clinic Level of Care Assessment Details Patient Name: Jesse Wade. Date of Service: 10/06/2016 12:45 PM Medical Record Number: [id number] Patient Account Number: [id number] Date of Birth/Sex: [birth year] (51 y.o. Male) Treating RN: Afful, RN, BSN,  [place] Sink Primary Care Brittin Janik: Vonita Moss Other Clinician: Referring Selma Rodelo: Vonita Moss Treating Overton Boggus/Extender: Altamese [place] in Treatment: 0 Clinic Level of Care Assessment Items TOOL 2 Quantity Score  - Use when only an EandM is performed on the INITIAL visit 0 ASSESSMENTS - Nursing Assessment / Reassessment X - General Physical Exam (combine w/ comprehensive assessment (listed just 1 20 below) when performed on new pt. evals) X - Comprehensive Assessment (HX, ROS, Risk Assessments, Wounds Hx, etc.) 1 25 ASSESSMENTS - Wound and Skin Assessment / Reassessment  - Simple Wound Assessment / Reassessment - one wound 0 X - Complex Wound Assessment / Reassessment - multiple wounds 3 5  - Dermatologic / Skin Assessment (not related to wound area) 0 ASSESSMENTS - Ostomy and/or Continence Assessment and Care  - Incontinence Assessment and Management 0  - Ostomy Care Assessment and Management (repouching, etc.) 0 PROCESS - Coordination of Care X - Simple Patient / Family Education for ongoing care 1 15  - Complex (extensive) Patient / Family Education for ongoing care 0 X - Staff obtains Chiropractor, Records, Test Results / Process Orders 1 10  - Staff telephones HHA, Nursing Homes / Clarify orders / etc 0  - Routine Transfer to another Facility (non-emergent condition) 0  - Routine Hospital Admission (non-emergent condition) 0 X - New Admissions / Manufacturing engineer / Ordering NPWT, Apligraf, etc. 1 15  - Emergency Hospital Admission (emergent condition) 0  - Simple Discharge Coordination 0 JEDRICK, HUTCHERSON ([id number])  - Complex (extensive) Discharge Coordination 0 PROCESS - Special Needs  - Pediatric / Minor Patient Management 0  - Isolation Patient Management 0  - Hearing / Language / Visual special needs 0  - Assessment of Community assistance (transportation, D/C planning, etc.) 0  - Additional assistance / Altered mentation  0  - Support Surface(s) Assessment (bed, cushion, seat, etc.) 0 INTERVENTIONS - Wound Cleansing / Measurement X - Wound Imaging (photographs - any number of wounds) 1 5  - Wound Tracing (instead of photographs) 0  - Simple Wound Measurement - one wound 0 X -  Complex Wound Measurement - multiple wounds 3 5  - Simple Wound Cleansing - one wound 0 X - Complex Wound Cleansing - multiple wounds 3 5 INTERVENTIONS - Wound Dressings X - Small Wound Dressing one or multiple wounds 3 10  - Medium Wound Dressing one or multiple wounds 0  - Large Wound Dressing one or multiple wounds 0  - Application of Medications - injection 0 INTERVENTIONS - Miscellaneous  - External ear exam 0  - Specimen Collection (cultures, biopsies, blood, body fluids, etc.) 0  - Specimen(s) / Culture(s) sent or taken to Lab for analysis 0  - Patient Transfer (multiple staff / Michiel Sites Lift / Similar devices) 0  - Simple Staple / Suture removal (25 or less) 0  - Complex Staple / Suture removal (26 or more) 0 SERVANDO, KYLLONEN ([id number])  - Hypo / Hyperglycemic Management (close monitor of Blood Glucose) 0 X - Ankle / Brachial Index (ABI) - do not check if billed separately 1 15 Has the patient been seen at the hospital within the last three years: Yes Total Score: 180 Level Of Care: New/Established - Level 5 Electronic Signature(s) Signed: 10/06/2016 4:51:03 PM By: Elpidio Eric BSN, RN Entered By: Elpidio Eric on 10/06/2016 14:08:08 Jesse Wade ([id number]) -------------------------------------------------------------------------------- Encounter Discharge Information Details Patient Name: Jesse Wade. Date of Service: 10/06/2016 12:45 PM Medical Record Number: [id number] Patient Account Number: [id number] Date of Birth/Sex: [birth year] (51 y.o. Male) Treating RN: Afful, RN, BSN, [place] Sink Primary Care Zhanae Proffit: Vonita Moss Other Clinician: Referring Amaar Oshita: Vonita Moss Treating  Lakia Gritton/Extender: Altamese [place] in Treatment: 0 Encounter Discharge Information Items Discharge Pain Level: 0 Discharge Condition: Stable Ambulatory Status: Ambulatory Discharge Destination: Home Transportation: Private Auto Accompanied By: wife Schedule Follow-up Appointment: No Medication Reconciliation completed and provided to Patient/Care No Cammeron Greis: Provided on Clinical Summary of Care: 10/06/2016 Form Type Recipient Paper Patient JA Electronic Signature(s) Signed: 10/06/2016 4:51:03 PM By: Elpidio Eric BSN, RN Previous Signature: 10/06/2016 2:28:16 PM Version By: Gwenlyn Perking Entered By: Elpidio Eric on 10/06/2016 14:32:24 Jesse Wade ([id number]) -------------------------------------------------------------------------------- Lower Extremity Assessment Details Patient Name: Jesse Wade. Date of Service: 10/06/2016 12:45 PM Medical Record Number: [id number] Patient Account Number: [id number] Date of Birth/Sex: 1965/08/18 (51 y.o. Male) Treating RN: Afful, RN, BSN, [employer] Primary Care Dannia Snook: Vonita Moss Other Clinician: Referring Destynie Toomey: Vonita Moss Treating Kelvyn Schunk/Extender: Altamese [place]  in Treatment: 0 Edema Assessment Assessed: [Left: No] [Right: No] Edema: [Left: No] [Right: No] Calf Left: Right: Point of Measurement: 43 cm From Medial Instep 45 cm 43 cm Ankle Left: Right: Point of Measurement: 9 cm From Medial Instep 29.7 cm 27 cm Vascular Assessment Claudication: Claudication Assessment [Left:None] [Right:None] Pulses: Dorsalis Pedis Palpable: [Left:Yes] [Right:Yes] Doppler Audible: [Left:Yes] [Right:Yes] Posterior Tibial Palpable: [Left:Yes] [Right:Yes] Extremity colors, hair growth, and conditions: Extremity Color: [Left:Normal] [Right:Normal] Hair Growth on Extremity: [Left:Yes] [Right:Yes] Temperature of Extremity: [Left:Warm] [Right:Warm] Capillary Refill: [Left:< 3 seconds] [Right:< 3 seconds] Blood  Pressure: Brachial: [Right:117] Dorsalis Pedis: 140 [Left:Dorsalis Pedis: 150] Ankle: Posterior Tibial: [Left:Posterior Tibial: 1.20] [Right:1.28] Toe Nail Assessment Left: Right: Thick: No No Discolored: No No Deformed: No No SYRUS, NAKAMA ([id number]) Improper Length and Hygiene: No No Electronic Signature(s) Signed: 10/06/2016 4:51:03 PM By: Elpidio Eric BSN, RN Entered By: Elpidio Eric on 10/06/2016 13:50:24 Jesse Wade ([id number]) -------------------------------------------------------------------------------- Multi Wound Chart Details Patient Name: Jesse Wade. Date of Service: 10/06/2016 12:45 PM Medical Record Number: [id number] Patient Account Number: [id number] Date of Birth/Sex: 30-Aug-1965 (51 y.o. Male) Treating  RN: Clover Mealy, RN, BSN, [employer] Primary Care Kiele Heavrin: Vonita Moss Other Clinician: Referring William Schake: Vonita Moss Treating Rebekah Zackery/Extender: Altamese [employer] in Treatment: 0 Vital Signs Height(in): 78 Pulse(bpm): 57 Weight(lbs): 332 Blood Pressure 117/54 (mmHg): Body Mass Index(BMI): 38 Temperature(F): 97.7 Respiratory Rate 18 (breaths/min): Photos: Wound Location: Right Lower Leg - Anterior Left Lower Leg Left Lower Leg - Lateral Wounding Event: Gradually Appeared Gradually Appeared Gradually Appeared Primary Etiology: Venous Leg Ulcer Venous Leg Ulcer Venous Leg Ulcer Date Acquired: 09/21/2016 09/20/2016 09/20/2016 Weeks of Treatment: 0 0 0 Wound Status: Open Open Open Measurements L x W x D 0.8x0.8x0.1 9x7x0.1 1x2x0.1 (cm) Area (cm) : 0.503 49.48 1.571 Volume (cm) : 0.05 4.948 0.157 Classification: Partial Thickness Partial Thickness Partial Thickness Exudate Amount: Small Small Medium Exudate Type: Serosanguineous N/A Serous Exudate Color: red, brown N/A amber Wound Margin: Distinct, outline attached Distinct, outline attached Distinct, outline attached Granulation Amount: Large (67-100%) Large (67-100%) None Present  (0%) Granulation Quality: Pink, Pale Pink, Pale N/A KASHIUS, DOMINIC ([id number]) Necrotic Amount: None Present (0%) Small (1-33%) None Present (0%) Necrotic Tissue: N/A Eschar N/A Exposed Structures: Fascia: No Fascia: No Fascia: No Fat Layer (Subcutaneous Fat Layer (Subcutaneous Fat Layer (Subcutaneous Tissue) Exposed: No Tissue) Exposed: No Tissue) Exposed: No Tendon: No Tendon: No Tendon: No Muscle: No Muscle: No Muscle: No Joint: No Joint: No Joint: No Bone: No Bone: No Bone: No Limited to Skin Limited to Skin Limited to Skin Breakdown Breakdown Breakdown Epithelialization: None N/A None Periwound Skin Texture: Excoriation: No Excoriation: No Excoriation: No Induration: No Induration: No Induration: No Callus: No Callus: No Callus: No Crepitus: No Crepitus: No Crepitus: No Rash: No Rash: No Rash: No Scarring: No Scarring: No Scarring: No Periwound Skin Maceration: No Maceration: No Maceration: No Moisture: Dry/Scaly: No Dry/Scaly: No Dry/Scaly: No Periwound Skin Color: Atrophie Blanche: No Atrophie Blanche: No Atrophie Blanche: No Cyanosis: No Cyanosis: No Cyanosis: No Ecchymosis: No Ecchymosis: No Ecchymosis: No Erythema: No Erythema: No Erythema: No Hemosiderin Staining: No Hemosiderin Staining: No Hemosiderin Staining: No Mottled: No Mottled: No Mottled: No Pallor: No Pallor: No Pallor: No Rubor: No Rubor: No Rubor: No Temperature: No Abnormality No Abnormality No Abnormality Tenderness on No No No Palpation: Wound Preparation: Ulcer Cleansing: Ulcer Cleansing: Ulcer Cleansing: Rinsed/Irrigated with Rinsed/Irrigated with Rinsed/Irrigated with Saline Saline Saline Topical Anesthetic Topical Anesthetic Topical Anesthetic Applied: Other: lidocaine Applied: Other: lidocaine Applied: Other: Lidocaine 4% 4% 4% Treatment Notes Wound #1 (Right, Anterior Lower Leg) 1. Cleansed with: Cleanse wound with antibacterial soap and water 3.  Peri-wound Care: Other peri-wound care (specify in notes) 4. Dressing Applied: Aquacel Ag 5. Secondary Dressing Applied Gauze and Kerlix/Conform FRANKO, HILLIKER ([id number]) 7. Secured with 3 Layer Compression System - Left Lower Extremity Wound #2 (Left Lower Leg) 1. Cleansed with: Cleanse wound with antibacterial soap and water 3. Peri-wound Care: Other peri-wound care (specify in notes) 4. Dressing Applied: Aquacel Ag 5. Secondary Dressing Applied Gauze and Kerlix/Conform 7. Secured with 3 Layer Compression System - Left Lower Extremity Wound #3 (Left, Lateral Lower Leg) 1. Cleansed with: Cleanse wound with antibacterial soap and water 3. Peri-wound Care: Other peri-wound care (specify in notes) 4. Dressing Applied: Aquacel Ag 5. Secondary Dressing Applied Gauze and Kerlix/Conform 7. Secured with 3 Layer Compression System - Left Lower Extremity Electronic Signature(s) Signed: 10/07/2016 7:57:33 AM By: Baltazar Najjar MD Previous Signature: 10/06/2016 4:51:03 PM Version By: Elpidio Eric BSN, RN Entered By: Baltazar Najjar on 10/06/2016 17:04:45 Jesse Wade ([id number]) -------------------------------------------------------------------------------- Multi-Disciplinary Care  Plan Details Patient Name: EMMAUEL, HALLUMS. Date of Service: 10/06/2016 12:45 PM Medical Record Number: [id number] Patient Account Number: [id number] Date of Birth/Sex: 1966/04/30 (51 y.o. Male) Treating RN: Afful, RN, BSN, [place] Sink Primary Care Atari Novick: Vonita Moss Other Clinician: Referring Yuleimy Kretz: Vonita Moss Treating Makell Drohan/Extender: Altamese [place] in Treatment: 0 Active Inactive ` Orientation to the Wound Care Program Nursing Diagnoses: Knowledge deficit related to the wound healing center program Goals: Patient/caregiver will verbalize understanding of the Wound Healing Center Program Date Initiated: 10/06/2016 Target Resolution Date: 02/05/2017 Goal Status:  Active Interventions: Provide education on orientation to the wound center Notes: ` Venous Leg Ulcer Nursing Diagnoses: Actual venous Insuffiency (use after diagnosis is confirmed) Knowledge deficit related to disease process and management Potential for venous Insuffiency (use before diagnosis confirmed) Goals: Patient will maintain optimal edema control Date Initiated: 10/06/2016 Target Resolution Date: 02/05/2017 Goal Status: Active Patient/caregiver will verbalize understanding of disease process and disease management Date Initiated: 10/06/2016 Target Resolution Date: 02/05/2017 Goal Status: Active Verify adequate tissue perfusion prior to therapeutic compression application Date Initiated: 10/06/2016 Target Resolution Date: 02/05/2017 Goal Status: Active Interventions: Assess peripheral edema status every visit. DAREK, EIFLER ([id number]) Compression as ordered Provide education on venous insufficiency Treatment Activities: Therapeutic compression applied : 10/06/2016 Notes: ` Wound/Skin Impairment Nursing Diagnoses: Impaired tissue integrity Knowledge deficit related to ulceration/compromised skin integrity Goals: Patient/caregiver will verbalize understanding of skin care regimen Date Initiated: 10/06/2016 Target Resolution Date: 02/05/2017 Goal Status: Active Ulcer/skin breakdown will have a volume reduction of 30% by week 4 Date Initiated: 10/06/2016 Target Resolution Date: 02/05/2017 Goal Status: Active Ulcer/skin breakdown will have a volume reduction of 50% by week 8 Date Initiated: 10/06/2016 Target Resolution Date: 02/05/2017 Goal Status: Active Ulcer/skin breakdown will have a volume reduction of 80% by week 12 Date Initiated: 10/06/2016 Target Resolution Date: 02/05/2017 Goal Status: Active Ulcer/skin breakdown will heal within 14 weeks Date Initiated: 10/06/2016 Target Resolution Date: 02/05/2017 Goal Status: Active Interventions: Assess patient/caregiver  ability to obtain necessary supplies Assess patient/caregiver ability to perform ulcer/skin care regimen upon admission and as needed Provide education on smoking Provide education on ulcer and skin care Treatment Activities: Referred to DME Tanveer Brammer for dressing supplies : 10/06/2016 Skin care regimen initiated : 10/06/2016 Topical wound management initiated : 10/06/2016 Notes: GOLDMAN, BIRCHALL ([id number]) Electronic Signature(s) Signed: 10/06/2016 4:51:03 PM By: Elpidio Eric BSN, RN Entered By: Elpidio Eric on 10/06/2016 14:02:26 Jesse Wade ([id number]) -------------------------------------------------------------------------------- Pain Assessment Details Patient Name: Jesse Wade. Date of Service: 10/06/2016 12:45 PM Medical Record Number: [id number] Patient Account Number: [id number] Date of Birth/Sex: 1965-09-10 (51 y.o. Male) Treating RN: Afful, RN, BSN, [place] Sink Primary Care Jhordan Mckibben: Vonita Moss Other Clinician: Referring Davis Ambrosini: Vonita Moss Treating Careli Luzader/Extender: Altamese [place] in Treatment: 0 Active Problems Location of Pain Severity and Description of Pain Patient Has Paino No Site Locations With Dressing Change: No Pain Management and Medication Current Pain Management: Electronic Signature(s) Signed: 10/06/2016 4:51:03 PM By: Elpidio Eric BSN, RN Entered By: Elpidio Eric on 10/06/2016 13:26:49 Jesse Wade ([id number]) -------------------------------------------------------------------------------- Patient/Caregiver Education Details Patient Name: Jesse Wade. Date of Service: 10/06/2016 12:45 PM Medical Record Patient Account Number: [id number] Number: Treating RN: Clover Mealy, RN, BSN, Rita 1965-09-19 (50 y.o. Other Clinician: Date of Birth/Gender: Male) Treating ROBSON, MICHAEL Primary Care Physician: Vonita Moss Physician/Extender: G Referring Physician: Vonita Moss Weeks in Treatment: 0 Education Assessment Education  Provided To: Patient Education Topics Provided Smoking and Wound Healing: Methods: Explain/Verbal Responses: State content correctly  Venous: Methods: Explain/Verbal Responses: State content correctly Welcome To The Wound Care Center: Methods: Explain/Verbal Responses: State content correctly Wound/Skin Impairment: Methods: Explain/Verbal Responses: State content correctly Electronic Signature(s) Signed: 10/06/2016 4:51:03 PM By: Elpidio Eric BSN, RN Entered By: Elpidio Eric on 10/06/2016 14:32:46 Jesse Wade ([id number]) -------------------------------------------------------------------------------- Wound Assessment Details Patient Name: Jesse Wade. Date of Service: 10/06/2016 12:45 PM Medical Record Number: [id number] Patient Account Number: [id number] Date of Birth/Sex: 1966/01/27 (51 y.o. Male) Treating RN: Afful, RN, BSN, [employer] Primary Care Shannie Kontos: Vonita Moss Other Clinician: Referring Darrell Hauk: Vonita Moss Treating Janeliz Prestwood/Extender: Maxwell Caul Weeks in Treatment: 0 Wound Status Wound Number: 1 Primary Etiology: Venous Leg Ulcer Wound Location: Right Lower Leg - Anterior Wound Status: Open Wounding Event: Gradually Appeared Date Acquired: 09/21/2016 Weeks Of Treatment: 0 Clustered Wound: No Photos Photo Uploaded By: Elpidio Eric on 10/06/2016 17:03:15 Wound Measurements Length: (cm) 0.8 Width: (cm) 0.8 Depth: (cm) 0.1 Area: (cm) 0.503 Volume: (cm) 0.05 % Reduction in Area: % Reduction in Volume: Epithelialization: None Tunneling: No Undermining: No Wound Description Classification: Partial Thickness Foul Odor Af Wound Margin: Distinct, outline attached Slough/Fibri Exudate Amount: Small Exudate Type: Serosanguineous Exudate Color: red, brown ter Cleansing: No no No Wound Bed Granulation Amount: Large (67-100%) Exposed Structure Granulation Quality: Pink, Pale Fascia Exposed: No Necrotic Amount: None Present (0%) Fat Layer  (Subcutaneous Tissue) Exposed: No Tendon Exposed: No BUFORD, BREMER ([id number]) Muscle Exposed: No Joint Exposed: No Bone Exposed: No Limited to Skin Breakdown Periwound Skin Texture Texture Color No Abnormalities Noted: No No Abnormalities Noted: No Callus: No Atrophie Blanche: No Crepitus: No Cyanosis: No Excoriation: No Ecchymosis: No Induration: No Erythema: No Rash: No Hemosiderin Staining: No Scarring: No Mottled: No Pallor: No Moisture Rubor: No No Abnormalities Noted: No Dry / Scaly: No Temperature / Pain Maceration: No Temperature: No Abnormality Wound Preparation Ulcer Cleansing: Rinsed/Irrigated with Saline Topical Anesthetic Applied: Other: lidocaine 4%, Treatment Notes Wound #1 (Right, Anterior Lower Leg) 1. Cleansed with: Cleanse wound with antibacterial soap and water 3. Peri-wound Care: Other peri-wound care (specify in notes) 4. Dressing Applied: Aquacel Ag 5. Secondary Dressing Applied Gauze and Kerlix/Conform 7. Secured with 3 Layer Compression System - Left Lower Extremity Electronic Signature(s) Signed: 10/06/2016 4:51:03 PM By: Elpidio Eric BSN, RN Entered By: Elpidio Eric on 10/06/2016 13:35:23 Jesse Wade ([id number]) -------------------------------------------------------------------------------- Wound Assessment Details Patient Name: Jesse Wade. Date of Service: 10/06/2016 12:45 PM Medical Record Number: [id number] Patient Account Number: [id number] Date of Birth/Sex: 1965/11/14 (51 y.o. Male) Treating RN: Afful, RN, BSN, [employer] Primary Care Keeara Frees: Vonita Moss Other Clinician: Referring Lenzie Montesano: Vonita Moss Treating Xzavion Doswell/Extender: Maxwell Caul Weeks in Treatment: 0 Wound Status Wound Number: 2 Primary Etiology: Venous Leg Ulcer Wound Location: Left Lower Leg Wound Status: Open Wounding Event: Gradually Appeared Date Acquired: 09/20/2016 Weeks Of Treatment: 0 Clustered Wound: No Photos Photo Uploaded By:  Elpidio Eric on 10/06/2016 17:03:16 Wound Measurements Length: (cm) 9 Width: (cm) 7 Depth: (cm) 0.1 Area: (cm) 49.48 Volume: (cm) 4.948 % Reduction in Area: % Reduction in Volume: Tunneling: No Undermining: No Wound Description Classification: Partial Thickness Foul Odor Af Wound Margin: Distinct, outline attached Slough/Fibri Exudate Amount: Small ter Cleansing: No no No Wound Bed Granulation Amount: Large (67-100%) Exposed Structure Granulation Quality: Pink, Pale Fascia Exposed: No Necrotic Amount: Small (1-33%) Fat Layer (Subcutaneous Tissue) Exposed: No Necrotic Quality: Eschar Tendon Exposed: No Muscle Exposed: No Joint Exposed: No EVAAN, TIDWELL ([id number]) Bone Exposed: No Limited to Skin Breakdown Periwound Skin Texture  Texture Color No Abnormalities Noted: No No Abnormalities Noted: No Callus: No Atrophie Blanche: No Crepitus: No Cyanosis: No Excoriation: No Ecchymosis: No Induration: No Erythema: No Rash: No Hemosiderin Staining: No Scarring: No Mottled: No Pallor: No Moisture Rubor: No No Abnormalities Noted: No Dry / Scaly: No Temperature / Pain Maceration: No Temperature: No Abnormality Wound Preparation Ulcer Cleansing: Rinsed/Irrigated with Saline Topical Anesthetic Applied: Other: lidocaine 4%, Treatment Notes Wound #2 (Left Lower Leg) 1. Cleansed with: Cleanse wound with antibacterial soap and water 3. Peri-wound Care: Other peri-wound care (specify in notes) 4. Dressing Applied: Aquacel Ag 5. Secondary Dressing Applied Gauze and Kerlix/Conform 7. Secured with 3 Layer Compression System - Left Lower Extremity Electronic Signature(s) Signed: 10/06/2016 4:51:03 PM By: Elpidio Eric BSN, RN Entered By: Elpidio Eric on 10/06/2016 13:37:45 Jesse Wade ([id number]) -------------------------------------------------------------------------------- Wound Assessment Details Patient Name: Jesse Wade. Date of Service: 10/06/2016  12:45 PM Medical Record Number: [id number] Patient Account Number: [id number] Date of Birth/Sex: [birth year] (50 y.o. Male) Treating RN: Afful, RN, BSN, [employer] Primary Care Josemanuel Eakins: Vonita Moss Other Clinician: Referring Vraj Denardo: Vonita Moss Treating Salaam Battershell/Extender: Maxwell Caul Weeks in Treatment: 0 Wound Status Wound Number: 3 Primary Etiology: Venous Leg Ulcer Wound Location: Left Lower Leg - Lateral Wound Status: Open Wounding Event: Gradually Appeared Date Acquired: 09/20/2016 Weeks Of Treatment: 0 Clustered Wound: No Photos Photo Uploaded By: Elpidio Eric on 10/06/2016 17:04:00 Wound Measurements Length: (cm) 1 Width: (cm) 2 Depth: (cm) 0.1 Area: (cm) 1.571 Volume: (cm) 0.157 % Reduction in Area: % Reduction in Volume: Epithelialization: None Tunneling: No Undermining: No Wound Description Classification: Partial Thickness Wound Margin: Distinct, outline attached Exudate Amount: Medium Exudate Type: Serous Exudate Color: amber WATSON, ROBARGE ([id number]) Foul Odor After Cleansing: No Slough/Fibrino No Wound Bed Granulation Amount: None Present (0%) Exposed Structure Necrotic Amount: None Present (0%) Fascia Exposed: No Fat Layer (Subcutaneous Tissue) Exposed: No Tendon Exposed: No Muscle Exposed: No Joint Exposed: No Bone Exposed: No Limited to Skin Breakdown Periwound Skin Texture Texture Color No Abnormalities Noted: No No Abnormalities Noted: No Callus: No Atrophie Blanche: No Crepitus: No Cyanosis: No Excoriation: No Ecchymosis: No Induration: No Erythema: No Rash: No Hemosiderin Staining: No Scarring: No Mottled: No Pallor: No Moisture Rubor: No No Abnormalities Noted: No Dry / Scaly: No Temperature / Pain Maceration: No Temperature: No Abnormality Wound Preparation Ulcer Cleansing: Rinsed/Irrigated with Saline Topical Anesthetic Applied: Other: Lidocaine 4%, Treatment Notes Wound #3 (Left, Lateral Lower Leg) 1.  Cleansed with: Cleanse wound with antibacterial soap and water 3. Peri-wound Care: Other peri-wound care (specify in notes) 4. Dressing Applied: Aquacel Ag 5. Secondary Dressing Applied Gauze and Kerlix/Conform 7. Secured with 3 Layer Compression System - Left Lower Extremity Electronic Signature(s) Signed: 10/06/2016 4:51:03 PM By: Elpidio Eric BSN, RN Entered By: Elpidio Eric on 10/06/2016 13:41:35 Jesse Wade ([id number]) STEWART, SASAKI ([id number]) -------------------------------------------------------------------------------- Vitals Details Patient Name: Jesse Wade. Date of Service: 10/06/2016 12:45 PM Medical Record Number: [id number] Patient Account Number: [id number] Date of Birth/Sex: 1966/06/01 (51 y.o. Male) Treating RN: Afful, RN, BSN, Rita Primary Care Bronsyn Shappell: Vonita Moss Other Clinician: Referring Rama Sorci: Vonita Moss Treating Baldemar Dady/Extender: [place] in Treatment: 0 Vital Signs Time Taken: 13:26 Temperature (F): 97.7 Height (in): 78 Pulse (bpm): 57 Source: Stated Respiratory Rate (breaths/min): 18 Weight (lbs): 332 Blood Pressure (mmHg): 117/54 Source: Stated Reference Range: 80 - 120 mg / dl Body Mass Index (BMI): 38.4 Electronic Signature(s) Signed: 10/06/2016 4:51:03 PM By: Elpidio Eric BSN, RN Entered  ByElpidio Eric on 10/06/2016 16:10:96

## 2016-10-08 NOTE — Progress Notes (Signed)
ANA, LIAW ([id number]) Visit Report for 10/06/2016 Chief Complaint Document Details Patient Name: Jesse Wade, Jesse Wade. Date of Service: 10/06/2016 12:45 PM Medical Record Patient Account Number: [id number] Number: Treating RN: Clover Mealy, RN, BSN, Rita 01-Dec-1965 (51 y.o. Other Clinician: Date of Birth/Sex: Male) Treating Anndrea Mihelich Primary Care Provider: Vonita Moss Provider/Extender: G Referring Provider: Vonita Moss Weeks in Treatment: 0 Information Obtained from: Patient Chief Complaint 10/06/16; patient is here for wounds on his bilateral lower extremities in the setting of chronic venous insufficiency Electronic Signature(s) Signed: 10/07/2016 7:57:33 AM By: Baltazar Najjar MD Entered By: Baltazar Najjar on 10/06/2016 17:05:14 Jesse Wade ([id number]) -------------------------------------------------------------------------------- HPI Details Patient Name: Jesse Wade. Date of Service: 10/06/2016 12:45 PM Medical Record Patient Account Number: [id number] Number: Treating RN: Clover Mealy, RN, BSN, Rita 1965-12-18 (51 y.o. Other Clinician: Date of Birth/Sex: Male) Treating Chayden Garrelts Primary Care Provider: Vonita Moss Provider/Extender: G Referring Provider: Vonita Moss Weeks in Treatment: 0 History of Present Illness HPI Description: 10/06/16; this is a 51 year old man who is not a diabetic or a smoker. He has a history of chronic venous insufficiency having had venous ablations on both legs in 2012. These were done locally although I known have the exact reports and I am not exactly sure which superficial veins he had ablated. In any case he has been left with chronic venous insufficiency with stasis dermatitis. He has had small wounds in the last several years that of mostly healed on their own with minimal interventions. He saw dermatology and was given a combination of nystatin and triamcinolone which seemed to help for a while. He tells  me is had a wound on the left medial lower leg for the last several months. He is been putting topical creams on this. He finds this very itchy and irritating and difficult not to scratch. He also has wounds on the right anterior lower leg and the left lateral lower leg ABIs in this clinic per 1.2 on the right 1.28 on the left. Electronic Signature(s) Signed: 10/07/2016 7:57:33 AM By: Baltazar Najjar MD Entered By: Baltazar Najjar on 10/06/2016 17:07:28 Jesse Wade ([id number]) -------------------------------------------------------------------------------- Physical Exam Details Patient Name: Jesse Wade, Jesse Wade. Date of Service: 10/06/2016 12:45 PM Medical Record Patient Account Number: [id number] Number: Treating RN: Clover Mealy, RN, BSN, Rita 04-27-1966 (51 y.o. Other Clinician: Date of Birth/Sex: Male) Treating Kenslie Abbruzzese Primary Care Provider: Vonita Moss Provider/Extender: G Referring Provider: Vonita Moss Weeks in Treatment: 0 Constitutional Sitting or standing Blood Pressure is within target range for patient.. Pulse regular and within target range for patient.Marland Kitchen Respirations regular, non-labored and within target range.. Temperature is normal and within the target range for the patient.. Patient's appearance is neat and clean. Appears in no acute distress. Well nourished and well developed.. Eyes Conjunctivae clear. No discharge.Marland Kitchen Respiratory Respiratory effort is easy and symmetric bilaterally. Rate is normal at rest and on room air.. Cardiovascular Heart rhythm and rate regular, without murmur or gallop.. Pedal pulses palpable and strong bilaterally.. Edema present in both extremities. Edema is 1+. He has changes of chronic venous insufficiency with hemosiderin deposition. Lymphatic None palpable in the popliteal or inguinal area. Integumentary (Hair, Skin) Area of erythema etiology on the left lower leg is not tender. There is excoriations here but no open  wound.. Notes Wound exam; the most bothersome area to the patient is an area of erythema on the medial left leg. This has no warmth or tenderness and I suspect this is chronic venous inflammation. Although  he has recently been put on antibiotics I really think this is not cellulitis. He has small open areas on the right lateral and left lateral lower leg which I think are reasonably healthy-looking wounds. There is no evidence of surrounding infection. Nothing to make me think about concurrent arterial insufficiency. No evidence of lymphedema Electronic Signature(s) Signed: 10/07/2016 7:57:33 AM By: Baltazar Najjar MD Entered By: Baltazar Najjar on 10/06/2016 17:09:43 Jesse Wade ([id number]) -------------------------------------------------------------------------------- Physician Orders Details Patient Name: Jesse Wade. Date of Service: 10/06/2016 12:45 PM Medical Record Patient Account Number: [id number] Number: Treating RN: Clover Mealy, RN, BSN, Rita [birth year] (51 y.o. Other Clinician: Date of Birth/Sex: Male) Treating Ludger Bones Primary Care Provider: Vonita Moss Provider/Extender: G Referring Provider: Vonita Moss Weeks in Treatment: 0 Verbal / Phone Orders: No Diagnosis Coding Wound Cleansing Wound #1 Right,Anterior Lower Leg o Clean wound with Normal Saline. Wound #2 Left Lower Leg o Clean wound with Normal Saline. Wound #3 Left,Lateral Lower Leg o Clean wound with Normal Saline. Anesthetic Wound #1 Right,Anterior Lower Leg o Topical Lidocaine 4% cream applied to wound bed prior to debridement Wound #2 Left Lower Leg o Topical Lidocaine 4% cream applied to wound bed prior to debridement Wound #3 Left,Lateral Lower Leg o Topical Lidocaine 4% cream applied to wound bed prior to debridement Skin Barriers/Peri-Wound Care Wound #1 Right,Anterior Lower Leg o Triamcinolone Acetonide Ointment Wound #2 Left Lower Leg o Triamcinolone  Acetonide Ointment Wound #3 Left,Lateral Lower Leg o Triamcinolone Acetonide Ointment Primary Wound Dressing Wound #1 Right,Anterior Lower Leg o Aquacel Ag Wound #2 Left Lower Leg CYRUSS, ARATA ([id number]) o Aquacel Ag Wound #3 Left,Lateral Lower Leg o Aquacel Ag Secondary Dressing Wound #1 Right,Anterior Lower Leg o ABD pad o Boardered Foam Dressing - or Coverllet Wound #2 Left Lower Leg o ABD pad o Boardered Foam Dressing - or Coverllet Wound #3 Left,Lateral Lower Leg o ABD pad o Boardered Foam Dressing - or Coverllet Dressing Change Frequency Wound #2 Left Lower Leg o Change dressing every week Wound #3 Left,Lateral Lower Leg o Change dressing every week Wound #1 Right,Anterior Lower Leg o Change dressing every other day. Follow-up Appointments Wound #1 Right,Anterior Lower Leg o Return Appointment in 1 week. Wound #2 Left Lower Leg o Return Appointment in 1 week. Wound #3 Left,Lateral Lower Leg o Return Appointment in 1 week. Edema Control Wound #2 Left Lower Leg o 3 Layer Compression System - Left Lower Extremity o Elevate legs to the level of the heart and pump ankles as often as possible Wound #3 Left,Lateral Lower Leg o 3 Layer Compression System - Left Lower Extremity o Elevate legs to the level of the heart and pump ankles as often as possible Wound #1 Right,Anterior Lower Leg Jesse Wade, Jesse Wade ([id number]) o Patient to wear own compression stockings o Elevate legs to the level of the heart and pump ankles as often as possible Additional Orders / Instructions Wound #1 Right,Anterior Lower Leg o Increase protein intake. o Activity as tolerated Wound #2 Left Lower Leg o Increase protein intake. o Activity as tolerated Wound #3 Left,Lateral Lower Leg o Increase protein intake. o Activity as tolerated Electronic Signature(s) Signed: 10/06/2016 4:51:03 PM By: Elpidio Eric BSN, RN Signed: 10/07/2016  7:57:33 AM By: Baltazar Najjar MD Entered By: Elpidio Eric on 10/06/2016 14:07:06 Jesse Wade ([id number]) -------------------------------------------------------------------------------- Problem List Details Patient Name: COLTER, MAGOWAN. Date of Service: 10/06/2016 12:45 PM Medical Record Patient Account Number: [id number] Number: Treating RN: Afful, RN, BSN,  Rita [birth year] (50 y.o. Other Clinician: Date of Birth/Sex: Male) Treating Lando Alcalde Primary Care Provider: Vonita Moss Provider/Extender: G Referring Provider: Vonita Moss Weeks in Treatment: 0 Active Problems ICD-10 Encounter Code Description Active Date Diagnosis L97.821 Non-pressure chronic ulcer of other part of left lower leg 10/06/2016 Yes limited to breakdown of skin L97.811 Non-pressure chronic ulcer of other part of right lower leg 10/06/2016 Yes limited to breakdown of skin I87.333 Chronic venous hypertension (idiopathic) with ulcer and 10/06/2016 Yes inflammation of bilateral lower extremity Inactive Problems Resolved Problems Electronic Signature(s) Signed: 10/07/2016 7:57:33 AM By: Baltazar Najjar MD Entered By: Baltazar Najjar on 10/06/2016 17:04:17 Jesse Wade ([id number]) -------------------------------------------------------------------------------- Progress Note Details Patient Name: Jesse Wade. Date of Service: 10/06/2016 12:45 PM Medical Record Patient Account Number: [id number] Number: Treating RN: Clover Mealy, RN, BSN, Rita [birth year] (50 y.o. Other Clinician: Date of Birth/Sex: Male) Treating Kyan Giannone Primary Care Provider: Vonita Moss Provider/Extender: G Referring Provider: Vonita Moss Weeks in Treatment: 0 Subjective Chief Complaint Information obtained from Patient 10/06/16; patient is here for wounds on his bilateral lower extremities in the setting of chronic venous insufficiency History of Present Illness (HPI) 10/06/16; this is a  51 year old man who is not a diabetic or a smoker. He has a history of chronic venous insufficiency having had venous ablations on both legs in 2012. These were done locally although I known have the exact reports and I am not exactly sure which superficial veins he had ablated. In any case he has been left with chronic venous insufficiency with stasis dermatitis. He has had small wounds in the last several years that of mostly healed on their own with minimal interventions. He saw dermatology and was given a combination of nystatin and triamcinolone which seemed to help for a while. He tells me is had a wound on the left medial lower leg for the last several months. He is been putting topical creams on this. He finds this very itchy and irritating and difficult not to scratch. He also has wounds on the right anterior lower leg and the left lateral lower leg ABIs in this clinic per 1.2 on the right 1.28 on the left. Wound History Patient presents with 3 open wounds that have been present for approximately months. Patient has been treating wounds in the following manner: TCA, nystatin. Laboratory tests have not been performed in the last month. Patient reportedly has tested positive for an antibiotic resistant organism. Patient reportedly has not tested positive for osteomyelitis. Patient reportedly has had testing performed to evaluate circulation in the legs. Patient History Information obtained from Patient. Allergies Seasonal allergies, PCN Family History Heart Disease - Father, Siblings, Paternal Grandparents, Hypertension - Siblings, Mother, Father, Stroke - Jesse Wade, Jesse Wade ([id number]) Siblings, Mother, Maternal Grandparents, No family history of Cancer, Diabetes, Hereditary Spherocytosis, Kidney Disease, Lung Disease, Seizures, Thyroid Problems, Tuberculosis. Social History Never smoker, Marital Status - Married, Alcohol Use - Never, Drug Use - No History, Caffeine Use  - Moderate. Medical History Eyes Denies history of Cataracts, Glaucoma, Optic Neuritis Ear/Nose/Mouth/Throat Denies history of Chronic sinus problems/congestion, Middle ear problems Hematologic/Lymphatic Denies history of Anemia, Hemophilia, Human Immunodeficiency Virus, Lymphedema, Sickle Cell Disease Respiratory Denies history of Aspiration, Asthma, Chronic Obstructive Pulmonary Disease (COPD), Pneumothorax, Sleep Apnea, Tuberculosis Cardiovascular Patient has history of Hypertension Denies history of Angina, Arrhythmia, Congestive Heart Failure, Coronary Artery Disease, Deep Vein Thrombosis, Hypotension, Myocardial Infarction, Peripheral Arterial Disease, Peripheral Venous Disease, Phlebitis, Vasculitis Gastrointestinal Denies history of Cirrhosis , Colitis, Crohn s,  Hepatitis A, Hepatitis B, Hepatitis C Endocrine Denies history of Type I Diabetes, Type II Diabetes Genitourinary Denies history of End Stage Renal Disease Immunological Denies history of Lupus Erythematosus, Raynaud s, Scleroderma Integumentary (Skin) Denies history of History of Burn, History of pressure wounds Musculoskeletal Denies history of Gout, Rheumatoid Arthritis, Osteoarthritis, Osteomyelitis Neurologic Denies history of Dementia, Neuropathy, Quadriplegia, Paraplegia, Seizure Disorder Oncologic Denies history of Received Chemotherapy, Received Radiation Psychiatric Denies history of Anorexia/bulimia, Confinement Anxiety Medical And Surgical History Notes Musculoskeletal multiple ankle surgeries Review of Systems (ROS) Constitutional Symptoms (General Health) The patient has no complaints or symptoms. Eyes The patient has no complaints or symptoms. Jesse Wade, Jesse Wade ([id number]) Ear/Nose/Mouth/Throat The patient has no complaints or symptoms. Hematologic/Lymphatic The patient has no complaints or symptoms. Respiratory The patient has no complaints or symptoms. Cardiovascular Complains or has  symptoms of LE edema. Gastrointestinal The patient has no complaints or symptoms. Endocrine The patient has no complaints or symptoms. Genitourinary The patient has no complaints or symptoms. Immunological The patient has no complaints or symptoms. Integumentary (Skin) Complains or has symptoms of Wounds, Swelling. Musculoskeletal The patient has no complaints or symptoms. Neurologic The patient has no complaints or symptoms. Oncologic The patient has no complaints or symptoms. Psychiatric The patient has no complaints or symptoms. Objective Constitutional Sitting or standing Blood Pressure is within target range for patient.. Pulse regular and within target range for patient.Marland Kitchen Respirations regular, non-labored and within target range.. Temperature is normal and within the target range for the patient.. Patient's appearance is neat and clean. Appears in no acute distress. Well nourished and well developed.. Vitals Time Taken: 1:26 PM, Height: 78 in, Source: Stated, Weight: 332 lbs, Source: Stated, BMI: 38.4, Temperature: 97.7 F, Pulse: 57 bpm, Respiratory Rate: 18 breaths/min, Blood Pressure: 117/54 mmHg. Eyes Conjunctivae clear. No discharge.Marland Kitchen Respiratory Respiratory effort is easy and symmetric bilaterally. Rate is normal at rest and on room air.Marland Kitchen Jesse Wade, Jesse Wade DMarland Kitchen ([id number]) Cardiovascular Heart rhythm and rate regular, without murmur or gallop.. Pedal pulses palpable and strong bilaterally.. Edema present in both extremities. Edema is 1+. He has changes of chronic venous insufficiency with hemosiderin deposition. Lymphatic None palpable in the popliteal or inguinal area. General Notes: Wound exam; the most bothersome area to the patient is an area of erythema on the medial left leg. This has no warmth or tenderness and I suspect this is chronic venous inflammation. Although he has recently been put on antibiotics I really think this is not cellulitis. He has small open  areas on the right lateral and left lateral lower leg which I think are reasonably healthy-looking wounds. There is no evidence of surrounding infection. Nothing to make me think about concurrent arterial insufficiency. No evidence of lymphedema Integumentary (Hair, Skin) Area of erythema etiology on the left lower leg is not tender. There is excoriations here but no open wound.. Wound #1 status is Open. Original cause of wound was Gradually Appeared. The wound is located on the Right,Anterior Lower Leg. The wound measures 0.8cm length x 0.8cm width x 0.1cm depth; 0.503cm^2 area and 0.05cm^3 volume. The wound is limited to skin breakdown. There is no tunneling or undermining noted. There is a small amount of serosanguineous drainage noted. The wound margin is distinct with the outline attached to the wound base. There is large (67-100%) pink, pale granulation within the wound bed. There is no necrotic tissue within the wound bed. The periwound skin appearance did not exhibit: Callus, Crepitus, Excoriation, Induration, Rash, Scarring, Dry/Scaly, Maceration, Atrophie  Blanche, Cyanosis, Ecchymosis, Hemosiderin Staining, Mottled, Pallor, Rubor, Erythema. Periwound temperature was noted as No Abnormality. Wound #2 status is Open. Original cause of wound was Gradually Appeared. The wound is located on the Left Lower Leg. The wound measures 9cm length x 7cm width x 0.1cm depth; 49.48cm^2 area and 4.948cm^3 volume. The wound is limited to skin breakdown. There is no tunneling or undermining noted. There is a small amount of drainage noted. The wound margin is distinct with the outline attached to the wound base. There is large (67-100%) pink, pale granulation within the wound bed. There is a small (1-33%) amount of necrotic tissue within the wound bed including Eschar. The periwound skin appearance did not exhibit: Callus, Crepitus, Excoriation, Induration, Rash, Scarring, Dry/Scaly, Maceration,  Atrophie Blanche, Cyanosis, Ecchymosis, Hemosiderin Staining, Mottled, Pallor, Rubor, Erythema. Periwound temperature was noted as No Abnormality. Wound #3 status is Open. Original cause of wound was Gradually Appeared. The wound is located on the Left,Lateral Lower Leg. The wound measures 1cm length x 2cm width x 0.1cm depth; 1.571cm^2 area and 0.157cm^3 volume. The wound is limited to skin breakdown. There is no tunneling or undermining noted. There is a medium amount of serous drainage noted. The wound margin is distinct with the outline attached to the wound base. There is no granulation within the wound bed. There is no necrotic tissue within the wound bed. The periwound skin appearance did not exhibit: Callus, Crepitus, Excoriation, Induration, Rash, Scarring, Dry/Scaly, Maceration, Atrophie Blanche, Cyanosis, Ecchymosis, Hemosiderin Staining, Mottled, Pallor, Rubor, Erythema. Periwound temperature was noted as No Abnormality. Jesse Wade, Jesse Wade ([id number]) Assessment Active Problems ICD-10 [phone] - Non-pressure chronic ulcer of other part of left lower leg limited to breakdown of skin L97.811 - Non-pressure chronic ulcer of other part of right lower leg limited to breakdown of skin I87.333 - Chronic venous hypertension (idiopathic) with ulcer and inflammation of bilateral lower extremity Plan Wound Cleansing: Wound #1 Right,Anterior Lower Leg: Clean wound with Normal Saline. Wound #2 Left Lower Leg: Clean wound with Normal Saline. Wound #3 Left,Lateral Lower Leg: Clean wound with Normal Saline. Anesthetic: Wound #1 Right,Anterior Lower Leg: Topical Lidocaine 4% cream applied to wound bed prior to debridement Wound #2 Left Lower Leg: Topical Lidocaine 4% cream applied to wound bed prior to debridement Wound #3 Left,Lateral Lower Leg: Topical Lidocaine 4% cream applied to wound bed prior to debridement Skin Barriers/Peri-Wound Care: Wound #1 Right,Anterior Lower  Leg: Triamcinolone Acetonide Ointment Wound #2 Left Lower Leg: Triamcinolone Acetonide Ointment Wound #3 Left,Lateral Lower Leg: Triamcinolone Acetonide Ointment Primary Wound Dressing: Wound #1 Right,Anterior Lower Leg: Aquacel Ag Wound #2 Left Lower Leg: Aquacel Ag Wound #3 Left,Lateral Lower Leg: Aquacel Ag Secondary Dressing: Wound #1 Right,Anterior Lower Leg: ABD pad Boardered Foam Dressing - or Coverllet Wound #2 Left Lower Leg: ABD pad KERIM, STATZER ([id number]) Boardered Foam Dressing - or Coverllet Wound #3 Left,Lateral Lower Leg: ABD pad Boardered Foam Dressing - or Coverllet Dressing Change Frequency: Wound #2 Left Lower Leg: Change dressing every week Wound #3 Left,Lateral Lower Leg: Change dressing every week Wound #1 Right,Anterior Lower Leg: Change dressing every other day. Follow-up Appointments: Wound #1 Right,Anterior Lower Leg: Return Appointment in 1 week. Wound #2 Left Lower Leg: Return Appointment in 1 week. Wound #3 Left,Lateral Lower Leg: Return Appointment in 1 week. Edema Control: Wound #2 Left Lower Leg: 3 Layer Compression System - Left Lower Extremity Elevate legs to the level of the heart and pump ankles as often as possible Wound #3 Left,Lateral  Lower Leg: 3 Layer Compression System - Left Lower Extremity Elevate legs to the level of the heart and pump ankles as often as possible Wound #1 Right,Anterior Lower Leg: Patient to wear own compression stockings Elevate legs to the level of the heart and pump ankles as often as possible Additional Orders / Instructions: Wound #1 Right,Anterior Lower Leg: Increase protein intake. Activity as tolerated Wound #2 Left Lower Leg: Increase protein intake. Activity as tolerated Wound #3 Left,Lateral Lower Leg: Increase protein intake. Activity as tolerated #1 we apply TCA to the lower legs bilaterally #2 Aquacel Ag to the wound areas o3, ABDs #3 I elected to wrap the left lower leg with 3  lower compression Jesse Wade, Jesse Wade. ([id number]) #4 border foam to the right lower leg #5 I suspect these areas will heal reasonably quickly. He is already had ablations bilaterally but I don't have a good sense of exactly what was ablated. If I am wrong and these become more problematic he might benefit from repeat reflux studies with consideration of repeat vascular surgery consultation to see if there is anything could benefit from further interventions. Electronic Signature(s) Signed: 10/07/2016 7:57:33 AM By: Baltazar Najjar MD Entered By: Baltazar Najjar on 10/06/2016 17:12:05 Jesse Wade ([id number]) -------------------------------------------------------------------------------- ROS/PFSH Details Patient Name: Jesse Wade, Jesse Wade. Date of Service: 10/06/2016 12:45 PM Medical Record Patient Account Number: [id number] Number: Treating RN: Clover Mealy, RN, BSN, Rita [birth year] (50 y.o. Other Clinician: Date of Birth/Sex: Male) Treating Humza Tallerico Primary Care Provider: Vonita Moss Provider/Extender: G Referring Provider: Vonita Moss Weeks in Treatment: 0 Information Obtained From Patient Wound History Do you currently have one or more open woundso Yes How many open wounds do you currently haveo 3 Approximately how long have you had your woundso months How have you been treating your wound(s) until nowo TCA, nystatin Has your wound(s) ever healed and then re-openedo No Have you had any lab work done in the past montho No Have you tested positive for osteomyelitis (bone infection)o No Have you had any tests for circulation on your legso Yes Who ordered the testo Dr. Phineas Semen Cardiovascular Complaints and Symptoms: Positive for: LE edema Medical History: Positive for: Hypertension Negative for: Angina; Arrhythmia; Congestive Heart Failure; Coronary Artery Disease; Deep Vein Thrombosis; Hypotension; Myocardial Infarction; Peripheral Arterial Disease; Peripheral Venous  Disease; Phlebitis; Vasculitis Integumentary (Skin) Complaints and Symptoms: Positive for: Wounds; Swelling Medical History: Negative for: History of Burn; History of pressure wounds Constitutional Symptoms (General Health) Complaints and Symptoms: No Complaints or Symptoms Eyes Jesse Wade, Jesse Wade ([id number]) Complaints and Symptoms: No Complaints or Symptoms Medical History: Negative for: Cataracts; Glaucoma; Optic Neuritis Ear/Nose/Mouth/Throat Complaints and Symptoms: No Complaints or Symptoms Medical History: Negative for: Chronic sinus problems/congestion; Middle ear problems Hematologic/Lymphatic Complaints and Symptoms: No Complaints or Symptoms Medical History: Negative for: Anemia; Hemophilia; Human Immunodeficiency Virus; Lymphedema; Sickle Cell Disease Respiratory Complaints and Symptoms: No Complaints or Symptoms Medical History: Negative for: Aspiration; Asthma; Chronic Obstructive Pulmonary Disease (COPD); Pneumothorax; Sleep Apnea; Tuberculosis Gastrointestinal Complaints and Symptoms: No Complaints or Symptoms Medical History: Negative for: Cirrhosis ; Colitis; Crohnos; Hepatitis A; Hepatitis B; Hepatitis C Endocrine Complaints and Symptoms: No Complaints or Symptoms Medical History: Negative for: Type I Diabetes; Type II Diabetes Genitourinary Complaints and Symptoms: No Complaints or Symptoms Jesse Wade, NGHIEM ([id number]) Medical History: Negative for: End Stage Renal Disease Immunological Complaints and Symptoms: No Complaints or Symptoms Medical History: Negative for: Lupus Erythematosus; Raynaudos; Scleroderma Musculoskeletal Complaints and Symptoms: No Complaints or Symptoms Medical History: Negative for:  Gout; Rheumatoid Arthritis; Osteoarthritis; Osteomyelitis Past Medical History Notes: multiple ankle surgeries Neurologic Complaints and Symptoms: No Complaints or Symptoms Medical History: Negative for: Dementia; Neuropathy;  Quadriplegia; Paraplegia; Seizure Disorder Oncologic Complaints and Symptoms: No Complaints or Symptoms Medical History: Negative for: Received Chemotherapy; Received Radiation Psychiatric Complaints and Symptoms: No Complaints or Symptoms Medical History: Negative for: Anorexia/bulimia; Confinement Anxiety Immunizations Pneumococcal Vaccine: Received Pneumococcal Vaccination: No Family and Social History EULAN, HEYWARD ([id number]) Cancer: No; Diabetes: No; Heart Disease: Yes - Father, Siblings, Paternal Grandparents; Hereditary Spherocytosis: No; Hypertension: Yes - Siblings, Mother, Father; Kidney Disease: No; Lung Disease: No; Seizures: No; Stroke: Yes - Siblings, Mother, Maternal Grandparents; Thyroid Problems: No; Tuberculosis: No; Never smoker; Marital Status - Married; Alcohol Use: Never; Drug Use: No History; Caffeine Use: Moderate; Financial Concerns: No; Food, Clothing or Shelter Needs: No; Support System Lacking: No; Transportation Concerns: No; Advanced Directives: No; Living Will: No Electronic Signature(s) Signed: 10/06/2016 4:51:03 PM By: Elpidio Eric BSN, RN Signed: 10/07/2016 7:57:33 AM By: Baltazar Najjar MD Entered By: Elpidio Eric on 10/06/2016 13:56:23 Jesse Wade ([id number]) -------------------------------------------------------------------------------- SuperBill Details Patient Name: Jesse Wade. Date of Service: 10/06/2016 Medical Record Patient Account Number: [id number] Number: Treating RN: Clover Mealy, RN, BSN, [place] Sink [birth year] (50 y.o. Other Clinician: Date of Birth/Sex: Male) Treating Coolidge Gossard Primary Care Provider: Vonita Moss Provider/Extender: G Referring Provider: Vonita Moss Weeks in Treatment: 0 Diagnosis Coding ICD-10 Codes Code Description [phone] Non-pressure chronic ulcer of other part of left lower leg limited to breakdown of skin L97.811 Non-pressure chronic ulcer of other part of right lower leg limited to  breakdown of skin Chronic venous hypertension (idiopathic) with ulcer and inflammation of bilateral lower I87.333 extremity Facility Procedures CPT4 Code: 21308657 Description: 84696 - WOUND CARE VISIT-LEV 5 EST PT Modifier: Quantity: 1 Physician Procedures CPT4: Description Modifier Quantity Code [id number] 99204 - WC PHYS LEVEL 4 - NEW PT 1 ICD-10 Description Diagnosis L97.821 Non-pressure chronic ulcer of other part of left lower leg limited to breakdown of skin L97.811 Non-pressure chronic ulcer of other  part of right lower leg limited to breakdown of skin I87.333 Chronic venous hypertension (idiopathic) with ulcer and inflammation of bilateral lower extremity Electronic Signature(s) Signed: 10/07/2016 7:57:33 AM By: Baltazar Najjar MD Entered By: Baltazar Najjar on 10/06/2016 17:12:26

## 2016-10-13 ENCOUNTER — Encounter: Payer: BLUE CROSS/BLUE SHIELD | Admitting: Internal Medicine

## 2016-10-13 DIAGNOSIS — I872 Venous insufficiency (chronic) (peripheral): Secondary | ICD-10-CM | POA: Diagnosis not present

## 2016-10-13 DIAGNOSIS — L97811 Non-pressure chronic ulcer of other part of right lower leg limited to breakdown of skin: Secondary | ICD-10-CM | POA: Diagnosis not present

## 2016-10-13 DIAGNOSIS — L97821 Non-pressure chronic ulcer of other part of left lower leg limited to breakdown of skin: Secondary | ICD-10-CM | POA: Diagnosis not present

## 2016-10-13 DIAGNOSIS — S81802D Unspecified open wound, left lower leg, subsequent encounter: Secondary | ICD-10-CM | POA: Diagnosis not present

## 2016-10-13 DIAGNOSIS — S81801A Unspecified open wound, right lower leg, initial encounter: Secondary | ICD-10-CM | POA: Diagnosis not present

## 2016-10-13 DIAGNOSIS — I87333 Chronic venous hypertension (idiopathic) with ulcer and inflammation of bilateral lower extremity: Secondary | ICD-10-CM | POA: Diagnosis not present

## 2016-10-13 DIAGNOSIS — I10 Essential (primary) hypertension: Secondary | ICD-10-CM | POA: Diagnosis not present

## 2016-10-14 NOTE — Progress Notes (Signed)
Jesse Wade, Jesse Wade ([id number]) Visit Report for 10/13/2016 Chief Complaint Document Details Patient Name: Jesse Wade, Jesse Wade. Date of Service: 10/13/2016 3:00 PM Medical Record Patient Account Number: [id number] Number: Treating RN: Clover Mealy, RN, BSN, [place] Sink 04-Aug-1965 (50 y.o. Other Clinician: Date of Birth/Sex: Male) Treating ROBSON, MICHAEL Primary Care Provider: Vonita Moss Provider/Extender: G Referring Provider: Vonita Moss Weeks in Treatment: 1 Information Obtained from: Patient Chief Complaint 10/06/16; patient is here for wounds on his bilateral lower extremities in the setting of chronic venous insufficiency Electronic Signature(s) Signed: 10/14/2016 7:39:10 AM By: Baltazar Najjar MD Entered By: Baltazar Najjar on 10/13/2016 15:32:00 Jesse Wade ([id number]) -------------------------------------------------------------------------------- HPI Details Patient Name: Jesse Wade. Date of Service: 10/13/2016 3:00 PM Medical Record Patient Account Number: [id number] Number: Treating RN: Clover Mealy, RN, BSN, [place] Sink December 29, 1965 (50 y.o. Other Clinician: Date of Birth/Sex: Male) Treating ROBSON, MICHAEL Primary Care Provider: Vonita Moss Provider/Extender: G Referring Provider: Vonita Moss Weeks in Treatment: 1 History of Present Illness HPI Description: 10/06/16; this is a 51 year old man who is not a diabetic or a smoker. He has a history of chronic venous insufficiency having had venous ablations on both legs in 2012. These were done locally although I known have the exact reports and I am not exactly sure which superficial veins he had ablated. In any case he has been left with chronic venous insufficiency with stasis dermatitis. He has had small wounds in the last several years that of mostly healed on their own with minimal interventions. He saw dermatology and was given a combination of nystatin and triamcinolone which seemed to help for a while. He tells  me is had a wound on the left medial lower leg for the last several months. He is been putting topical creams on this. He finds this very itchy and irritating and difficult not to scratch. He also has wounds on the right anterior lower leg and the left lateral lower leg ABIs in this clinic per 1.2 on the right 1.28 on the left. 10/13/16; patient's wounds on the left leg of largely closed over. A small wound on the right anterior leg. We is Prisma tall the wounds wrapped the left leg but left the right leg with border foam the erythema on the medial aspect of the left leg is a lot better. The patient has a history of venous ablations done at the [facility] but I think his actual surgeon has left. He has seen Dr. Lorretta Harp in the past however. He has 20-30 mm below-knee stockings that he is religiously compliant according to the patient and his wife who is present. He has new stockings at home from [employer] and [employer]) Signed: 10/14/2016 7:39:10 AM By: Baltazar Najjar MD Entered By: Baltazar Najjar on 10/13/2016 15:34:22 Jesse Wade ([id number]) -------------------------------------------------------------------------------- Physical Exam Details Patient Name: Jesse Wade. Date of Service: 10/13/2016 3:00 PM Medical Record Patient Account Number: [id number] Number: Treating RN: Clover Mealy, RN, BSN, [place] Sink 1965-09-26 (50 y.o. Other Clinician: Date of Birth/Sex: Male) Treating ROBSON, MICHAEL Primary Care Provider: Vonita Moss Provider/Extender: G Referring Provider: Vonita Moss Weeks in Treatment: 1 Constitutional Sitting or standing Blood Pressure is within target range for patient.. Pulse regular and within target range for patient.Marland Kitchen Respirations regular, non-labored and within target range.. Temperature is normal and within the target range for the patient.. Patient's appearance is neat and clean. Appears in no acute distress.  Well nourished and well developed.Marland Kitchen Respiratory Respiratory effort is easy and symmetric  bilaterally. Rate is normal at rest and on room air.. Cardiovascular Pedal pulses palpable and strong bilaterally.. Edema present in both extremities. This is well controlled.. Lymphatic None palpable in the popliteal or inguinal area. Integumentary (Hair, Skin) He has see area of erythema on the medial right ankle and heel. However this looks better than last week.. Notes When exam; the area of erythema on the medial left leg is better. I think this is chronic venous inflammation. He has small open areas on the right lateral leg that persist and on the left lower leg both of his wounds appear to be epithelialized. There is no surrounding infection. Electronic Signature(s) Signed: 10/14/2016 7:39:10 AM By: Baltazar Najjar MD Entered By: Baltazar Najjar on 10/13/2016 15:37:15 Jesse Wade ([id number]) -------------------------------------------------------------------------------- Physician Orders Details Patient Name: Jesse Wade, Jesse Wade. Date of Service: 10/13/2016 3:00 PM Medical Record Patient Account Number: [id number] Number: Treating RN: Clover Mealy, RN, BSN, [place] Sink August 01, 1965 (50 y.o. Other Clinician: Date of Birth/Sex: Male) Treating ROBSON, MICHAEL Primary Care Provider: Vonita Moss Provider/Extender: G Referring Provider: Vonita Moss Weeks in Treatment: 1 Verbal / Phone Orders: No Diagnosis Coding Wound Cleansing Wound #1 Right,Anterior Lower Leg o Clean wound with Normal Saline. Skin Barriers/Peri-Wound Care Wound #1 Right,Anterior Lower Leg o Moisturizing lotion Primary Wound Dressing Wound #1 Right,Anterior Lower Leg o Promogran Secondary Dressing Wound #1 Right,Anterior Lower Leg o Boardered Foam Dressing - or Coverllet Dressing Change Frequency Wound #1 Right,Anterior Lower Leg o Change dressing every other day. Follow-up Appointments Wound #1  Right,Anterior Lower Leg o Return Appointment in 1 week. Edema Control Wound #1 Right,Anterior Lower Leg o Patient to wear own compression stockings o Elevate legs to the level of the heart and pump ankles as often as possible Additional Orders / Instructions Wound #1 Right,Anterior Lower Leg o Increase protein intake. o Activity as tolerated Jesse Wade, Jesse Wade ([id number]) Electronic Signature(s) Signed: 10/13/2016 3:50:16 PM By: Elpidio Eric BSN, RN Signed: 10/14/2016 7:39:10 AM By: Baltazar Najjar MD Entered By: Elpidio Eric on 10/13/2016 15:20:39 Jesse Wade ([id number]) -------------------------------------------------------------------------------- Problem List Details Patient Name: Jesse Wade, Jesse Wade. Date of Service: 10/13/2016 3:00 PM Medical Record Patient Account Number: [id number] Number: Treating RN: Clover Mealy, RN, BSN, [place] Sink 1965-08-18 (50 y.o. 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 Date of Service: 10/13/2016 3:00 PM Medical Record Patient Account Number:  [id number] Number: Treating RN: Clover Mealy, RN, BSN, [place] Sink 31-Oct-1965 (50 y.o. Other Clinician: Date of Birth/Sex: Male) Treating ROBSON, MICHAEL Primary Care Provider: Vonita Moss Provider/Extender: G Referring Provider: Vonita Moss Weeks in Treatment: 1 Subjective Chief Complaint Information obtained from Patient 10/06/16; patient is here for wounds on his bilateral lower extremities in the setting of chronic venous insufficiency History of Present Illness (HPI) 10/06/16; this is a 51 year old man who is not a diabetic or a smoker. He has a history of chronic venous  insufficiency having had venous ablations on both legs in 2012. These were done locally although I known have the exact reports and I am not exactly sure which superficial veins he had ablated. In any case he has been left with chronic venous insufficiency with stasis dermatitis. He has had small wounds in the last several years that of mostly healed on their own with minimal interventions. He saw dermatology and was given a combination of nystatin and triamcinolone which seemed to help for a while. He tells me is had a wound on the left medial lower leg for the last several months. He is been putting topical creams on this. He finds this very itchy and irritating and difficult not to scratch. He also has wounds on the right anterior lower leg and the left lateral lower leg ABIs in this clinic per 1.2 on the right 1.28 on the left. 10/13/16; patient's wounds on the left leg of largely closed over. A small wound on the right anterior leg. We is Prisma tall the wounds wrapped the left leg but left the right leg with border foam the erythema on the medial aspect of the left leg is a lot better. The patient has a history of venous ablations done at the local vein and vascular group but I think his actual surgeon has left. He has seen Dr. Lorretta Harp in the past however. He has 20-30 mm below-knee stockings that he is  religiously compliant according to the patient and his wife who is present. He has new stockings at home from elastic therapy and [employer] Objective Constitutional Jesse Wade, Jesse Wade ([id number]) Sitting or standing Blood Pressure is within target range for patient.. Pulse regular and within target range for patient.Marland Kitchen Respirations regular, non-labored and within target range.. Temperature is normal and within the target range for the patient.. Patient's appearance is neat and clean. Appears in no acute distress. Well nourished and well developed.. Vitals Time Taken: 2:59 PM, Height: 78 in, Weight: 332 lbs, BMI: 38.4, Temperature: 98.1 F, Pulse: 66 bpm, Respiratory Rate: 18 breaths/min, Blood Pressure: 134/83 mmHg. Respiratory Respiratory effort is easy and symmetric bilaterally. Rate is normal at rest and on room air.. Cardiovascular Pedal pulses palpable and strong bilaterally.. Edema present in both extremities. This is well controlled.. Lymphatic None palpable in the popliteal or inguinal area. General Notes: When exam; the area of erythema on the medial left leg is better. I think this is chronic venous inflammation. He has small open areas on the right lateral leg that persist and on the left lower leg both of his wounds appear to be epithelialized. There is no surrounding infection. Integumentary (Hair, Skin) He has see area of erythema on the medial right ankle and heel. However this looks better than last week.. Wound #1 status is Open. Original cause of wound was Gradually Appeared. The wound is located on the Right,Anterior Lower Leg. The wound measures 0.5cm length x 0.4cm width x 0.1cm depth; 0.157cm^2 area and 0.016cm^3 volume. The wound is limited to skin breakdown. There is no tunneling or undermining noted. There is a none present amount of drainage noted. The wound margin is distinct with the outline attached to the wound base. There is large (67-100%) pink, pale granulation  within the wound bed. There is no necrotic tissue within the wound bed. The periwound skin appearance did not exhibit: Callus, Crepitus, Excoriation, Induration, Rash, Scarring, Dry/Scaly, Maceration, Atrophie Blanche, Cyanosis, Ecchymosis, Hemosiderin Staining, Mottled, Pallor, Rubor, Erythema. Periwound temperature was noted as No Abnormality.  Wound #2 status is Healed - Epithelialized. Original cause of wound was Gradually Appeared. The wound is located on the Left Lower Leg. The wound measures 0cm length x 0cm width x 0cm depth; 0cm^2 area and 0cm^3 volume. The wound is limited to skin breakdown. There is no tunneling or undermining noted. There is a none present amount of drainage noted. The wound margin is flat and intact. There is large (67- 100%) friable granulation within the wound bed. There is no necrotic tissue within the wound bed. The periwound skin appearance exhibited: Hemosiderin Staining. The periwound skin appearance did not exhibit: Callus, Crepitus, Excoriation, Induration, Rash, Scarring, Dry/Scaly, Maceration, Atrophie Blanche, Cyanosis, Ecchymosis, Mottled, Pallor, Rubor, Erythema. Periwound temperature was noted as No Abnormality. Wound #3 status is Healed - Epithelialized. Original cause of wound was Gradually Appeared. The wound is located on the Left,Lateral Lower Leg. The wound measures 0cm length x 0cm width x 0cm depth; 0cm^2 area and 0cm^3 volume. The wound is limited to skin breakdown. There is no tunneling or undermining noted. There is a none present amount of drainage noted. The wound margin is flat and intact. There is large (67-100%) friable granulation within the wound bed. There is no necrotic tissue within the wound bed. Jesse Wade, Jesse Wade. ([id number]) The periwound skin appearance exhibited: Hemosiderin Staining. The periwound skin appearance did not exhibit: Callus, Crepitus, Excoriation, Induration, Rash, Scarring, Dry/Scaly, Maceration, Atrophie  Blanche, Cyanosis, Ecchymosis, Mottled, Pallor, Rubor, Erythema. Periwound temperature was noted as No Abnormality. Assessment Active Problems ICD-10 L97.821 - Non-pressure chronic ulcer of other part of left lower leg limited to breakdown of skin L97.811 - Non-pressure chronic ulcer of other part of right lower leg limited to breakdown of skin I87.333 - Chronic venous hypertension (idiopathic) with ulcer and inflammation of bilateral lower extremity Plan Wound Cleansing: Wound #1 Right,Anterior Lower Leg: Clean wound with Normal Saline. Skin Barriers/Peri-Wound Care: Wound #1 Right,Anterior Lower Leg: Moisturizing lotion Primary Wound Dressing: Wound #1 Right,Anterior Lower Leg: Promogran Secondary Dressing: Wound #1 Right,Anterior Lower Leg: Boardered Foam Dressing - or Coverllet Dressing Change Frequency: Wound #1 Right,Anterior Lower Leg: Change dressing every other day. Follow-up Appointments: Wound #1 Right,Anterior Lower Leg: Return Appointment in 1 week. Edema Control: Wound #1 Right,Anterior Lower Leg: Patient to wear own compression stockings Elevate legs to the level of the heart and pump ankles as often as possible Additional Orders / Instructions: Wound #1 Right,Anterior Lower Leg: Increase protein intake. Activity as tolerated Jesse Wade, Jesse Wade ([id number]) #1 using Promogran to his wounds covered by border foam on the right. And his own compression stocking #2 wounds on the left leg of healed and I think he can go back into his own 20-30 mm stocking #3 he has had prior venous ablations and if he continues to have open areas probably need repeat reflux studies and a consult with pain and vascular(Dr. Lorretta Harp) Electronic Signature(s) Signed: 10/14/2016 7:39:10 AM By: Baltazar Najjar MD Entered By: Baltazar Najjar on 10/13/2016 15:39:21 Jesse Wade ([id number]) -------------------------------------------------------------------------------- SuperBill  Details Patient Name: Jesse Wade. Date of Service: 10/13/2016 Medical Record Patient Account Number: [id number] Number: Treating RN: Clover Mealy, RN, BSN, Rita Jun 25, 1965 (50 y.o. Other Clinician: Date of Birth/Sex: Male) Treating ROBSON, MICHAEL Primary Care Provider: Vonita Moss Provider/Extender: G Referring Provider: Vonita Moss Weeks in Treatment: 1 Diagnosis Coding ICD-10 Codes Code Description [phone] Non-pressure chronic ulcer of other part of left lower leg limited to breakdown of skin L97.811 Non-pressure chronic ulcer of other part of right lower leg  limited to breakdown of skin Chronic venous hypertension (idiopathic) with ulcer and inflammation of bilateral lower I87.333 extremity Facility Procedures CPT4 Code: [id number] Description: [phone] - WOUND CARE VISIT-LEV 2 EST PT Modifier: Quantity: 1 Physician Procedures CPT4: Description Modifier Quantity Code [id number] - WC PHYS LEVEL 3 - EST PT 1 ICD-10 Description Diagnosis L97.821 Non-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

## 2016-10-14 NOTE — Progress Notes (Addendum)
Jesse Wade, Jesse Wade ([id number]) Visit Report for 10/13/2016 Arrival Information Details Patient Name: Jesse Wade, Jesse Wade. Date of Service: 10/13/2016 3:00 PM Medical Record Number: [id number] Patient Account Number: [id number] Date of Birth/Sex: [birth year] (51 y.o. Male) Treating RN: Afful, RN, BSN, [employer] Primary Care Emeline Simpson: Vonita Moss Other Clinician: Referring Dannon Perlow: Vonita Moss Treating Kreston Ahrendt/Extender: Altamese [place] in Treatment: 1 Visit Information History Since Last Visit All ordered tests and consults were completed: No Patient Arrived: Ambulatory Added or deleted any medications: No Arrival Time: 14:53 Any new allergies or adverse reactions: No Accompanied By: wife Had a fall or experienced change in No Transfer Assistance: None activities of daily living that may affect Patient Identification Verified: Yes risk of falls: Secondary Verification Process Yes Signs or symptoms of abuse/neglect since last No Completed: visito Patient Requires Transmission-Based No Hospitalized since last visit: No Precautions: Has Dressing in Place as Prescribed: Yes Patient Has Alerts: No Has Compression in Place as Prescribed: Yes Pain Present Now: No Electronic Signature(s) Signed: 10/13/2016 2:53:37 PM By: Elpidio Eric BSN, RN Entered By: Elpidio Eric on 10/13/2016 14:53:37 Jesse Wade ([id number]) -------------------------------------------------------------------------------- Clinic Level of Care Assessment Details Patient Name: Jesse Wade. Date of Service: 10/13/2016 3:00 PM Medical Record Number: [id number] Patient Account Number: [id number] Date of Birth/Sex: July 15, 1965 (51 y.o. Male) Treating RN: Afful, RN, BSN, [employer] Primary Care Kimala Horne: Vonita Moss Other Clinician: Referring Laquida Cotrell: Vonita Moss Treating Yicel Shannon/Extender: Altamese [place] in Treatment: 1 Clinic Level of Care Assessment Items TOOL 4 Quantity Score  - Use when only an  EandM is performed on FOLLOW-UP visit 0 ASSESSMENTS - Nursing Assessment / Reassessment X - Reassessment of Co-morbidities (includes updates in patient status) 1 10 X - Reassessment of Adherence to Treatment Plan 1 5 ASSESSMENTS - Wound and Skin Assessment / Reassessment X - Simple Wound Assessment / Reassessment - one wound 1 5  - Complex Wound Assessment / Reassessment - multiple wounds 0  - Dermatologic / Skin Assessment (not related to wound area) 0 ASSESSMENTS - Focused Assessment  - Circumferential Edema Measurements - multi extremities 0  - Nutritional Assessment / Counseling / Intervention 0 X - Lower Extremity Assessment (monofilament, tuning fork, pulses) 1 5  - Peripheral Arterial Disease Assessment (using hand held doppler) 0 ASSESSMENTS - Ostomy and/or Continence Assessment and Care  - Incontinence Assessment and Management 0  - Ostomy Care Assessment and Management (repouching, etc.) 0 PROCESS - Coordination of Care X - Simple Patient / Family Education for ongoing care 1 15  - Complex (extensive) Patient / Family Education for ongoing care 0  - Staff obtains Chiropractor, Records, Test Results / Process Orders 0  - Staff telephones HHA, Nursing Homes / Clarify orders / etc 0  - Routine Transfer to another Facility (non-emergent condition) 0 Jesse Wade, Jesse Wade ([id number])  - Routine Hospital Admission (non-emergent condition) 0  - New Admissions / Manufacturing engineer / Ordering NPWT, Apligraf, etc. 0  - Emergency Hospital Admission (emergent condition) 0  - Simple Discharge Coordination 0  - Complex (extensive) Discharge Coordination 0 PROCESS - Special Needs  - Pediatric / Minor Patient Management 0  - Isolation Patient Management 0  - Hearing / Language / Visual special needs 0  - Assessment of Community assistance (transportation, D/C planning, etc.) 0  - Additional assistance / Altered mentation 0  - Support Surface(s)  Assessment (bed, cushion, seat, etc.) 0 INTERVENTIONS - Wound Cleansing / Measurement X - Simple Wound Cleansing - one wound 1 5  -  Complex Wound Cleansing - multiple wounds 0 X - Wound Imaging (photographs - any number of wounds) 1 5 []  - Wound Tracing (instead of photographs) 0 X - Simple Wound Measurement - one wound 1 5 []  - Complex Wound Measurement - multiple wounds 0 INTERVENTIONS - Wound Dressings X - Small Wound Dressing one or multiple wounds 1 10 []  - Medium Wound Dressing one or multiple wounds 0 []  - Large Wound Dressing one or multiple wounds 0 []  - Application of Medications - topical 0 []  - Application of Medications - injection 0 INTERVENTIONS - Miscellaneous []  - External ear exam 0 Jesse Wade, Jesse Wade ([id number]) []  - Specimen Collection (cultures, biopsies, blood, body fluids, etc.) 0 []  - Specimen(s) / Culture(s) sent or taken to Lab for analysis 0 []  - Patient Transfer (multiple staff / Michiel Sites Lift / Similar devices) 0 []  - Simple Staple / Suture removal (25 or less) 0 []  - Complex Staple / Suture removal (26 or more) 0 []  - Hypo / Hyperglycemic Management (close monitor of Blood Glucose) 0 []  - Ankle / Brachial Index (ABI) - do not check if billed separately 0 X - Vital Signs 1 5 Has the patient been seen at the hospital within the last three years: Yes Total Score: 70 Level Of Care: New/Established - Level 2 Electronic Signature(s) Signed: 10/13/2016 3:50:16 PM By: Elpidio Eric BSN, RN Entered By: Elpidio Eric on 10/13/2016 15:21:13 Jesse Wade ([id number]) -------------------------------------------------------------------------------- Encounter Discharge Information Details Patient Name: Jesse Wade. Date of Service: 10/13/2016 3:00 PM Medical Record Number: [id number] Patient Account Number: [id number] Date of Birth/Sex: [birth year] (51 y.o. Male) Treating RN: Afful, RN, BSN, [place] [employer] Primary Care Teagan Heidrick: Vonita Moss Other Clinician: Referring Etoile Looman:  Vonita Moss Treating Delsie Amador/Extender: Altamese [place] in Treatment: 1 Encounter Discharge Information Items Discharge Pain Level: 0 Discharge Condition: Stable Ambulatory Status: Ambulatory Discharge Destination: Home Transportation: Private Auto Accompanied By: wife Schedule Follow-up Appointment: No Medication Reconciliation completed and provided to Patient/Care No Taneshia Lorence: Provided on Clinical Summary of Care: 10/13/2016 Form Type Recipient Paper Patient JA Electronic Signature(s) Signed: 10/13/2016 3:50:16 PM By: Elpidio Eric BSN, RN Previous Signature: 10/13/2016 3:27:35 PM Version By: Gwenlyn Perking Entered By: Elpidio Eric on 10/13/2016 15:28:30 Jesse Wade ([id number]) -------------------------------------------------------------------------------- Lower Extremity Assessment Details Patient Name: Jesse Wade. Date of Service: 10/13/2016 3:00 PM Medical Record Number: [id number] Patient Account Number: [id number] Date of Birth/Sex: 12-12-1965 (51 y.o. Male) Treating RN: Afful, RN, BSN, [employer] Primary Care Zuma Hust: Vonita Moss Other Clinician: Referring Leoma Folds: Vonita Moss Treating Fenris Cauble/Extender: Altamese [place] in Treatment: 1 Edema Assessment Assessed: [Left: No] [Right: No] Edema: [Left: No] [Right: No] Calf Left: Right: Point of Measurement: 43 cm From Medial Instep 43.2 cm 43 cm Ankle Left: Right: Point of Measurement: 9 cm From Medial Instep 28.7 cm 27 cm Vascular Assessment Claudication: Claudication Assessment [Left:None] [Right:None] Pulses: Dorsalis Pedis Palpable: [Left:Yes] [Right:Yes] Posterior Tibial Extremity colors, hair growth, and conditions: Extremity Color: [Left:Mottled] [Right:Mottled] Hair Growth on Extremity: [Left:Yes] [Right:Yes] Temperature of Extremity: [Left:Warm] [Right:Warm] Capillary Refill: [Left:< 3 seconds] [Right:< 3 seconds] Toe Nail Assessment Left: Right: Thick: No No Discolored: No  No Deformed: No No Improper Length and Hygiene: No No Electronic Signature(s) Signed: 10/13/2016 3:50:16 PM By: Elpidio Eric BSN, RN Entered By: Elpidio Eric on 10/13/2016 15:04:23 Jesse Wade ([id number]) Jesse Wade ([id number]) -------------------------------------------------------------------------------- Multi Wound Chart Details Patient Name: Jesse Wade. Date of Service: 10/13/2016 3:00 PM Medical Record Number: [id number] Patient Account Number: [id number]  Date of Birth/Sex: May 30, 1966 (51 y.o. Male) Treating RN: Afful, RN, BSN, [employer] Primary Care Winda Summerall: Vonita Moss Other Clinician: Referring Eilene Voigt: Vonita Moss Treating Raylene Carmickle/Extender: Altamese [place] in Treatment: 1 Vital Signs Height(in): 78 Pulse(bpm): 66 Weight(lbs): 332 Blood Pressure 134/83 (mmHg): Body Mass Index(BMI): 38 Temperature(F): 98.1 Respiratory Rate 18 (breaths/min): Photos: [1:No Photos] [2:No Photos] [3:No Photos] Wound Location: [1:Right Lower Leg - Anterior Left Lower Leg] [3:Left Lower Leg - Lateral] Wounding Event: [1:Gradually Appeared] [2:Gradually Appeared] [3:Gradually Appeared] Primary Etiology: [1:Venous Leg Ulcer] [2:Venous Leg Ulcer] [3:Venous Leg Ulcer] Comorbid History: [1:Hypertension] [2:Hypertension] [3:Hypertension] Date Acquired: [1:09/21/2016] [2:09/20/2016] [3:09/20/2016] Weeks of Treatment: [1:1] [2:1] [3:1] Wound Status: [1:Open] [2:Healed - Epithelialized] [3:Healed - Epithelialized] Measurements L x W x D 0.5x0.4x0.1 [2:0x0x0] [3:0x0x0] (cm) Area (cm) : [1:0.157] [2:0] [3:0] Volume (cm) : [1:0.016] [2:0] [3:0] % Reduction in Area: [1:68.80%] [2:100.00%] [3:100.00%] % Reduction in Volume: 68.00% [2:100.00%] [3:100.00%] Classification: [1:Partial Thickness] [2:Partial Thickness] [3:Partial Thickness] Exudate Amount: [1:None Present] [2:None Present] [3:None Present] Wound Margin: [1:Distinct, outline attached Flat and Intact] [3:Flat and  Intact] Granulation Amount: [1:Large (67-100%)] [2:Large (67-100%)] [3:Large (67-100%)] Granulation Quality: [1:Pink, Pale] [2:Friable] [3:Friable] Necrotic Amount: [1:None Present (0%)] [2:None Present (0%)] [3:None Present (0%)] Exposed Structures: [1:Fascia: No Fat Layer (Subcutaneous Fat Layer (Subcutaneous Tissue) Exposed: No Tendon: No Muscle: No Joint: No Bone: No Limited to Skin Breakdown] [2:Fascia: No Tissue) Exposed: No Tendon: No Muscle: No Joint: No Bone: No Limited to Skin Breakdown]  [3:Fascia: No Fat Layer (Subcutaneous Tissue) Exposed: No Tendon: No Muscle: No Joint: No Bone: No Limited to Skin Breakdown] Epithelialization: [1:Medium (34-66%)] [2:None] [3:Large (67-100%)] Periwound Skin Texture: Excoriation: No Excoriation: No Excoriation: No Induration: No Induration: No Induration: No Callus: No Callus: No Callus: No Crepitus: No Crepitus: No Crepitus: No Rash: No Rash: No Rash: No Scarring: No Scarring: No Scarring: No Periwound Skin Maceration: No Maceration: No Maceration: No Moisture: Dry/Scaly: No Dry/Scaly: No Dry/Scaly: No Periwound Skin Color: Atrophie Blanche: No Hemosiderin Staining: Yes Hemosiderin Staining: Yes Cyanosis: No Atrophie Blanche: No Atrophie Blanche: No Ecchymosis: No Cyanosis: No Cyanosis: No Erythema: No Ecchymosis: No Ecchymosis: No Hemosiderin Staining: No Erythema: No Erythema: No Mottled: No Mottled: No Mottled: No Pallor: No Pallor: No Pallor: No Rubor: No Rubor: No Rubor: No Temperature: No Abnormality No Abnormality No Abnormality Tenderness on No No No Palpation: Wound Preparation: Ulcer Cleansing: Ulcer Cleansing: Other: Ulcer Cleansing: Other: Rinsed/Irrigated with surg scrub and water Surg scrub and water Saline, Other: surg scrub and water Topical Anesthetic Topical Anesthetic Applied: None Applied: None Topical Anesthetic Applied: None Treatment Notes Wound #1 (Right, Anterior Lower Leg) 1.  Cleansed with: Clean wound with Normal Saline 4. Dressing Applied: Promogran 5. Secondary Dressing Applied [place] 7. Secured with Patient to wear own compression stockings Electronic Signature(s) Signed: 10/14/2016 7:39:10 AM By: Baltazar Najjar MD Entered By: Baltazar Najjar on 10/13/2016 15:31:50 Jesse Wade ([id number]) -------------------------------------------------------------------------------- Multi-Disciplinary Care Plan Details Patient Name: Jesse Wade, Jesse Wade. Date of Service: 10/13/2016 3:00 PM Medical Record Number: [id number] Patient Account Number: [id number] Date of Birth/Sex: [birth year] (51 y.o. Male) Treating RN: Afful, RN, BSN, [place] [employer] Primary Care Dosia Yodice: Vonita Moss Other Clinician: Referring Day Greb: Vonita Moss Treating Evalisse Prajapati/Extender: Altamese [place] in Treatment: 1 Active Inactive ` Orientation to the Wound Care Program Nursing Diagnoses: Knowledge deficit related to the wound healing center program Goals: Patient/caregiver will verbalize understanding of the Wound Healing Center Program Date Initiated: 10/06/2016 Target Resolution Date: 02/05/2017 Goal Status: Active Interventions: Provide education on orientation  to the wound center Notes: ` Venous Leg Ulcer Nursing Diagnoses: Actual venous Insuffiency (use after diagnosis is confirmed) Knowledge deficit related to disease process and management Potential for venous Insuffiency (use before diagnosis confirmed) Goals: Patient will maintain optimal edema control Date Initiated: 10/06/2016 Target Resolution Date: 02/05/2017 Goal Status: Active Patient/caregiver will verbalize understanding of disease process and disease management Date Initiated: 10/06/2016 Target Resolution Date: 02/05/2017 Goal Status: Active Verify adequate tissue perfusion prior to therapeutic compression application Date Initiated: 10/06/2016 Target Resolution Date: 02/05/2017 Goal Status:  Active Interventions: Assess peripheral edema status every visit. Jesse Wade, Jesse Wade ([id number]) Compression as ordered Provide education on venous insufficiency Treatment Activities: Therapeutic compression applied : 10/06/2016 Notes: ` Wound/Skin Impairment Nursing Diagnoses: Impaired tissue integrity Knowledge deficit related to ulceration/compromised skin integrity Goals: Patient/caregiver will verbalize understanding of skin care regimen Date Initiated: 10/06/2016 Target Resolution Date: 02/05/2017 Goal Status: Active Ulcer/skin breakdown will have a volume reduction of 30% by week 4 Date Initiated: 10/06/2016 Target Resolution Date: 02/05/2017 Goal Status: Active Ulcer/skin breakdown will have a volume reduction of 50% by week 8 Date Initiated: 10/06/2016 Target Resolution Date: 02/05/2017 Goal Status: Active Ulcer/skin breakdown will have a volume reduction of 80% by week 12 Date Initiated: 10/06/2016 Target Resolution Date: 02/05/2017 Goal Status: Active Ulcer/skin breakdown will heal within 14 weeks Date Initiated: 10/06/2016 Target Resolution Date: 02/05/2017 Goal Status: Active Interventions: Assess patient/caregiver ability to obtain necessary supplies Assess patient/caregiver ability to perform ulcer/skin care regimen upon admission and as needed Provide education on smoking Provide education on ulcer and skin care Treatment Activities: Referred to DME Karion Cudd for dressing supplies : 10/06/2016 Skin care regimen initiated : 10/06/2016 Topical wound management initiated : 10/06/2016 Notes: Jesse Wade, Jesse Wade ([id number]) Electronic Signature(s) Signed: 10/13/2016 3:50:16 PM By: Elpidio Eric BSN, RN Entered By: Elpidio Eric on 10/13/2016 15:18:18 Jesse Wade ([id number]) -------------------------------------------------------------------------------- Pain Assessment Details Patient Name: Jesse Wade. Date of Service: 10/13/2016 3:00 PM Medical Record Number:  [id number] Patient Account Number: [id number] Date of Birth/Sex: 1965/10/31 (51 y.o. Male) Treating RN: Afful, RN, BSN, [place] [employer] Primary Care Kenise Barraco: Vonita Moss Other Clinician: Referring Lior Cartelli: Vonita Moss Treating Quinterius Gaida/Extender: Altamese [place] in Treatment: 1 Active Problems Location of Pain Severity and Description of Pain Patient Has Paino No Site Locations With Dressing Change: No Pain Management and Medication Current Pain Management: Electronic Signature(s) Signed: 10/13/2016 2:53:53 PM By: Elpidio Eric BSN, RN Entered By: Elpidio Eric on 10/13/2016 14:53:52 Jesse Wade ([id number]) -------------------------------------------------------------------------------- Patient/Caregiver Education Details Patient Name: Jesse Wade. Date of Service: 10/13/2016 3:00 PM Medical Record Patient Account Number: [id number] Number: Treating RN: Clover Mealy, RN, BSN, [place] Sink 02/22/1966 (50 y.o. Other Clinician: Date of Birth/Gender: Male) Treating ROBSON, MICHAEL Primary Care Physician: Vonita Moss Physician/Extender: G Referring Physician: Veverly Fells in Treatment: 1 Education Assessment Education Provided To: Patient Education Topics Provided Smoking and Wound Healing: Methods: Explain/Verbal Responses: State content correctly Venous: Methods: Explain/Verbal Responses: State content correctly Welcome To The Wound Care Center: Methods: Explain/Verbal Responses: State content correctly Wound/Skin Impairment: Methods: Explain/Verbal Responses: State content correctly Electronic Signature(s) Signed: 10/13/2016 3:50:16 PM By: Elpidio Eric BSN, RN Entered By: Elpidio Eric on 10/13/2016 15:28:49 Jesse Wade ([id number]) -------------------------------------------------------------------------------- Wound Assessment Details Patient Name: Jesse Wade. Date of Service: 10/13/2016 3:00 PM Medical Record Number: [id number] Patient Account Number:  [id number] Date of Birth/Sex: 1966/05/25 (51 y.o. Male) Treating RN: Afful, RN, BSN, [employer] Primary Care Erick Murin: Vonita Moss Other Clinician: Referring Clifford Coudriet: Vonita Moss Treating Breken Nazari/Extender: Baltazar Najjar  G Weeks in Treatment: 1 Wound Status Wound Number: 1 Primary Etiology: Venous Leg Ulcer Wound Location: Right Lower Leg - Anterior Wound Status: Open Wounding Event: Gradually Appeared Comorbid History: Hypertension Date Acquired: 09/21/2016 Weeks Of Treatment: 1 Clustered Wound: No Photos Photo Uploaded By: Elpidio Eric on 10/13/2016 15:45:26 Wound Measurements Length: (cm) 0.5 Width: (cm) 0.4 Depth: (cm) 0.1 Area: (cm) 0.157 Volume: (cm) 0.016 % Reduction in Area: 68.8% % Reduction in Volume: 68% Epithelialization: Medium (34-66%) Tunneling: No Undermining: No Wound Description Classification: Partial Thickness Foul Odor Af Wound Margin: Distinct, outline attached Slough/Fibri Exudate Amount: None Present ter Cleansing: No no No Wound Bed Granulation Amount: Large (67-100%) Exposed Structure Granulation Quality: Pink, Pale Fascia Exposed: No Necrotic Amount: None Present (0%) Fat Layer (Subcutaneous Tissue) Exposed: No Tendon Exposed: No Muscle Exposed: No Joint Exposed: No Jesse Wade, Jesse Wade ([id number]) Bone Exposed: No Limited to Skin Breakdown Periwound Skin Texture Texture Color No Abnormalities Noted: No No Abnormalities Noted: No Callus: No Atrophie Blanche: No Crepitus: No Cyanosis: No Excoriation: No Ecchymosis: No Induration: No Erythema: No Rash: No Hemosiderin Staining: No Scarring: No Mottled: No Pallor: No Moisture Rubor: No No Abnormalities Noted: No Dry / Scaly: No Temperature / Pain Maceration: No Temperature: No Abnormality Wound Preparation Ulcer Cleansing: Rinsed/Irrigated with Saline, Other: surg scrub and water, Topical Anesthetic Applied: None Treatment Notes Wound #1 (Right, Anterior Lower Leg) 1.  Cleansed with: Clean wound with Normal Saline 4. Dressing Applied: Promogran 5. Secondary Dressing Applied [place] 7. Secured with Patient to wear own compression stockings Electronic Signature(s) Signed: 10/13/2016 3:50:16 PM By: Elpidio Eric BSN, RN Entered By: Elpidio Eric on 10/13/2016 15:15:13 Jesse Wade ([id number]) -------------------------------------------------------------------------------- Wound Assessment Details Patient Name: Jesse Wade. Date of Service: 10/13/2016 3:00 PM Medical Record Number: [id number] Patient Account Number: [id number] Date of Birth/Sex: Jul 01, 1965 (51 y.o. Male) Treating RN: Afful, RN, BSN, [place] Primary Care Brandun Pinn: Vonita Moss Other Clinician: Referring Atharva Mirsky: Vonita Moss Treating Alphonzo Devera/Extender: Maxwell Caul Weeks in Treatment: 1 Wound Status Wound Number: 2 Primary Etiology: Venous Leg Ulcer Wound Location: Left Lower Leg Wound Status: Healed - Epithelialized Wounding Event: Gradually Appeared Comorbid History: Hypertension Date Acquired: 09/20/2016 Weeks Of Treatment: 1 Clustered Wound: No Photos Photo Uploaded By: Elpidio Eric on 10/13/2016 15:45:27 Wound Measurements Length: (cm) 0 % Reduction in Width: (cm) 0 % Reduction in Depth: (cm) 0 Epithelializat Area: (cm) 0 Tunneling: Volume: (cm) 0 Undermining: Area: 100% Volume: 100% ion: None No No Wound Description Classification: Partial Thickness Foul Odor Aft Wound Margin: Flat and Intact Slough/Fibrin Exudate Amount: None Present er Cleansing: No o No Wound Bed Granulation Amount: Large (67-100%) Exposed Structure Granulation Quality: Friable Fascia Exposed: No Necrotic Amount: None Present (0%) Fat Layer (Subcutaneous Tissue) Exposed: No Tendon Exposed: No Muscle Exposed: No Joint Exposed: No Jesse Wade, Jesse Wade ([id number]) Bone Exposed: No Limited to Skin Breakdown Periwound Skin Texture Texture Color No Abnormalities Noted: No No  Abnormalities Noted: No Callus: No Atrophie Blanche: No Crepitus: No Cyanosis: No Excoriation: No Ecchymosis: No Induration: No Erythema: No Rash: No Hemosiderin Staining: Yes Scarring: No Mottled: No Pallor: No Moisture Rubor: No No Abnormalities Noted: No Dry / Scaly: No Temperature / Pain Maceration: No Temperature: No Abnormality Wound Preparation Ulcer Cleansing: Other: surg scrub and water, Topical Anesthetic Applied: None Electronic Signature(s) Signed: 10/13/2016 3:50:16 PM By: Elpidio Eric BSN, RN Entered By: Elpidio Eric on 10/13/2016 15:16:29 Jesse Wade ([id number]) -------------------------------------------------------------------------------- Wound Assessment Details Patient Name: Jesse Wade. Date of Service:  10/13/2016 3:00 PM Medical Record Number: [id number] Patient Account Number: [id number] Date of Birth/Sex: 05/16/1966 (51 y.o. Male) Treating RN: Afful, RN, BSN, [employer] Primary Care Analiah Drum: Vonita Moss Other Clinician: Referring Itzayana Pardy: Vonita Moss Treating Eldor Conaway/Extender: Maxwell Caul Weeks in Treatment: 1 Wound Status Wound Number: 3 Primary Etiology: Venous Leg Ulcer Wound Location: Left Lower Leg - Lateral Wound Status: Healed - Epithelialized Wounding Event: Gradually Appeared Comorbid History: Hypertension Date Acquired: 09/20/2016 Weeks Of Treatment: 1 Clustered Wound: No Photos Photo Uploaded By: Elpidio Eric on 10/13/2016 15:46:02 Wound Measurements Length: (cm) 0 % Reduction in Width: (cm) 0 % Reduction in Depth: (cm) 0 Epithelializat Area: (cm) 0 Tunneling: Volume: (cm) 0 Undermining: Area: 100% Volume: 100% ion: Large (67-100%) No No Wound Description Classification: Partial Thickness Foul Odor Aft Wound Margin: Flat and Intact Slough/Fibrin Exudate Amount: None Present er Cleansing: No o No Wound Bed Jesse Wade, KRETSCHMER ([id number]) Granulation Amount: Large (67-100%) Exposed Structure Granulation  Quality: Friable Fascia Exposed: No Necrotic Amount: None Present (0%) Fat Layer (Subcutaneous Tissue) Exposed: No Tendon Exposed: No Muscle Exposed: No Joint Exposed: No Bone Exposed: No Limited to Skin Breakdown Periwound Skin Texture Texture Color No Abnormalities Noted: No No Abnormalities Noted: No Callus: No Atrophie Blanche: No Crepitus: No Cyanosis: No Excoriation: No Ecchymosis: No Induration: No Erythema: No Rash: No Hemosiderin Staining: Yes Scarring: No Mottled: No Pallor: No Moisture Rubor: No No Abnormalities Noted: No Dry / Scaly: No Temperature / Pain Maceration: No Temperature: No Abnormality Wound Preparation Ulcer Cleansing: Other: Surg scrub and water, Topical Anesthetic Applied: None Electronic Signature(s) Signed: 10/13/2016 3:50:16 PM By: Elpidio Eric BSN, RN Entered By: Elpidio Eric on 10/13/2016 15:17:50 Jesse Wade ([id number]) -------------------------------------------------------------------------------- Vitals Details Patient Name: Jesse Wade. Date of Service: 10/13/2016 3:00 PM Medical Record Number: [id number] Patient Account Number: [id number] Date of Birth/Sex: [birth year] (51 y.o. Male) Treating RN: Afful, RN, BSN, Rita Primary Care Kamrin Spath: Vonita Moss Other Clinician: Referring Mikenna Bunkley: Vonita Moss Treating Adya Wirz/Extender: Altamese Sutherland in Treatment: 1 Vital Signs Time Taken: 14:59 Temperature (F): 98.1 Height (in): 78 Pulse (bpm): 66 Weight (lbs): 332 Respiratory Rate (breaths/min): 18 Body Mass Index (BMI): 38.4 Blood Pressure (mmHg): 134/83 Reference Range: 80 - 120 mg / dl Electronic Signature(s) Signed: 10/13/2016 3:50:16 PM By: Elpidio Eric BSN, RN Entered By: Elpidio Eric on 10/13/2016 14:59:39

## 2016-10-16 ENCOUNTER — Encounter: Payer: Self-pay | Admitting: Family Medicine

## 2016-10-16 ENCOUNTER — Ambulatory Visit (INDEPENDENT_AMBULATORY_CARE_PROVIDER_SITE_OTHER): Payer: BLUE CROSS/BLUE SHIELD | Admitting: Family Medicine

## 2016-10-16 VITALS — BP 120/80 | HR 60 | Temp 98.0°F | Wt 328.0 lb

## 2016-10-16 DIAGNOSIS — H6501 Acute serous otitis media, right ear: Secondary | ICD-10-CM | POA: Diagnosis not present

## 2016-10-16 MED ORDER — AZITHROMYCIN 250 MG PO TABS
ORAL_TABLET | ORAL | 0 refills | Status: DC
Start: 2016-10-16 — End: 2017-07-08

## 2016-10-16 NOTE — Patient Instructions (Signed)
Follow up for your Physical Exam sometime in the next few months

## 2016-10-16 NOTE — Progress Notes (Signed)
   BP 120/80   Pulse 60   Temp 98 F (36.7 C)   Wt (!) 328 lb (148.8 kg)   SpO2 98%   BMI 38.90 kg/m    Subjective:    Patient ID: Jesse Wade, male    DOB: [birth year], 51 y.o.   MRN: [id number]  HPI: Jesse Wade is a 51 y.o. male  Chief Complaint  Patient presents with  . Ear Pain    right ear feels clogged, pressure. No fever, no head congestion, no runny nose, no cough.   Patient presents with muffled hearing and sensation of clogged ears x 4 days. Denies fever, ear pain, chills, rhinorrhea, sore throat, cough. Recently recovered from URI about a week ago.  Hx of otitis media in January, treated with resolution with azithromycin. No sick contacts.   Relevant past medical, surgical, family and social history reviewed and updated as indicated. Interim medical history since our last visit reviewed. Allergies and medications reviewed and updated.  Review of Systems  Constitutional: Negative.   HENT: Positive for hearing loss (muffled).   Respiratory: Negative.   Cardiovascular: Negative.   Gastrointestinal: Negative.   Genitourinary: Negative.   Musculoskeletal: Negative.   Neurological: Negative.   Psychiatric/Behavioral: Negative.     Per HPI unless specifically indicated above     Objective:    BP 120/80   Pulse 60   Temp 98 F (36.7 C)   Wt (!) 328 lb (148.8 kg)   SpO2 98%   BMI 38.90 kg/m   Wt Readings from Last 3 Encounters:  10/16/16 (!) 328 lb (148.8 kg)  10/05/16 (!) 328 lb 12.8 oz (149.1 kg)  07/03/16 (!) 326 lb 14.4 oz (148.3 kg)    Physical Exam  Constitutional: He is oriented to person, place, and time. He appears well-developed and well-nourished. No distress.  HENT:  Head: Atraumatic.  Nose: Nose normal.  Mouth/Throat: Oropharynx is clear and moist.  Right TM mildly injected, trace effusion present Left TM benign  Eyes: Conjunctivae are normal. Pupils are equal, round, and reactive to light.  Neck: Normal range of motion. Neck supple.    Cardiovascular: Normal rate and normal heart sounds.   Pulmonary/Chest: Effort normal and breath sounds normal. No respiratory distress.  Musculoskeletal: Normal range of motion.  Neurological: He is alert and oriented to person, place, and time.  Skin: Skin is warm and dry.  Psychiatric: He has a normal mood and affect. His behavior is normal.  Nursing note and vitals reviewed.     Assessment & Plan:   Problem List Items Addressed This Visit    None    Visit Diagnoses    Right acute serous otitis media, recurrence not specified    -  Primary   Continue allergy regimen, add flonase and singulair. Can take sudafed prn if becoming painful. If worsening or no improvement over weekend, can take zpak   Relevant Medications   azithromycin (ZITHROMAX) 250 MG tablet      Follow up plan: Return for CPE.

## 2016-10-20 ENCOUNTER — Encounter: Payer: BLUE CROSS/BLUE SHIELD | Attending: Internal Medicine | Admitting: Internal Medicine

## 2016-10-20 DIAGNOSIS — L97811 Non-pressure chronic ulcer of other part of right lower leg limited to breakdown of skin: Secondary | ICD-10-CM | POA: Diagnosis not present

## 2016-10-20 DIAGNOSIS — L97821 Non-pressure chronic ulcer of other part of left lower leg limited to breakdown of skin: Secondary | ICD-10-CM | POA: Insufficient documentation

## 2016-10-20 DIAGNOSIS — S81801A Unspecified open wound, right lower leg, initial encounter: Secondary | ICD-10-CM | POA: Diagnosis not present

## 2016-10-20 DIAGNOSIS — I87333 Chronic venous hypertension (idiopathic) with ulcer and inflammation of bilateral lower extremity: Secondary | ICD-10-CM | POA: Diagnosis not present

## 2016-10-20 DIAGNOSIS — I872 Venous insufficiency (chronic) (peripheral): Secondary | ICD-10-CM | POA: Diagnosis not present

## 2016-10-20 DIAGNOSIS — I10 Essential (primary) hypertension: Secondary | ICD-10-CM | POA: Insufficient documentation

## 2016-10-22 NOTE — Progress Notes (Signed)
Jesse Wade, Jalani D. ([id number]) Visit Report for 10/20/2016 Chief Complaint Document Details Patient Name: Jesse Wade, Jesse D. Date of Service: 10/20/2016 2:30 PM Medical Record Patient Account Number: [id number] Number: Treating RN: Curtis SitesDorthy, Joanna 02/01/1966 (51 y.o. Other Clinician: Date of Birth/Sex: Male) Treating ROBSON, MICHAEL Primary Care Provider: Vonita MossRISSMAN, MARK Provider/Extender: G Referring Provider: Vonita MossRISSMAN, MARK Weeks in Treatment: 2 Information Obtained from: Patient Chief Complaint 10/06/16; patient is here for wounds on his bilateral lower extremities in the setting of chronic venous insufficiency Electronic Signature(s) Signed: 10/21/2016 7:53:49 AM By: Baltazar Najjarobson, Michael MD Entered By: Baltazar Najjarobson, Michael on 10/20/2016 15:14:19 Jesse Wade, Carthel D. ([id number]) -------------------------------------------------------------------------------- HPI Details Patient Name: Jesse Wade, Jesse D. Date of Service: 10/20/2016 2:30 PM Medical Record Patient Account Number: [id number] Number: Treating RN: Curtis SitesDorthy, Joanna 02/01/1966 (51 y.o. Other Clinician: Date of Birth/Sex: Male) Treating ROBSON, MICHAEL Primary Care Provider: Vonita MossRISSMAN, MARK Provider/Extender: G Referring Provider: Vonita MossRISSMAN, MARK Weeks in Treatment: 2 History of Present Illness HPI Description: 10/06/16; this is a 51 year old man who is not a diabetic or a smoker. He has a history of chronic venous insufficiency having had venous ablations on both legs in 2012. These were done locally although I known have the exact reports and I am not exactly sure which superficial veins he had ablated. In any case he has been left with chronic venous insufficiency with stasis dermatitis. He has had small wounds in the last several years that of mostly healed on their own with minimal interventions. He saw dermatology and was given a combination of nystatin and triamcinolone which seemed to help for a while. He tells me is had a wound  on the left medial lower leg for the last several months. He is been putting topical creams on this. He finds this very itchy and irritating and difficult not to scratch. He also has wounds on the right anterior lower leg and the left lateral lower leg ABIs in this clinic per 1.2 on the right 1.28 on the left. 10/13/16; patient's wounds on the left leg of largely closed over. A small wound on the right anterior leg. We is Prisma tall the wounds wrapped the left leg but left the right leg with border foam the erythema on the medial aspect of the left leg is a lot better. The patient has a history of venous ablations done at the local [facility] but I think his actual surgeon has left. He has seen Dr. Lorretta HarpSchneir in the past however. He has 20-30 mm below-knee stockings that he is religiously compliant according to the patient and his wife who is present. He has new stockings at home from [facility] and [place]. 10/20/16; the patient's wounds on his left leg closed over last week. The small remaining wound on the right anterior leg is also closed. He has new compression 20-30 mm stockings that he brought at [facility] in [place]. His edema looks to be well controlled Electronic Signature(s) Signed: 10/21/2016 7:53:49 AM By: Baltazar Najjarobson, Michael MD Entered By: Baltazar Najjarobson, Michael on 10/20/2016 15:16:29 Jesse Wade, Kardell D. ([id number]) -------------------------------------------------------------------------------- Physical Exam Details Patient Name: Jesse Wade, Jesse D. Date of Service: 10/20/2016 2:30 PM Medical Record Patient Account Number: [id number] Number: Treating RN: Curtis SitesDorthy, Joanna 02/01/1966 (51 y.o. Other Clinician: Date of Birth/Sex: Male) Treating ROBSON, MICHAEL Primary Care Provider: Vonita MossRISSMAN, MARK Provider/Extender: G Referring Provider: Vonita MossRISSMAN, MARK Weeks in Treatment: 2 Constitutional Sitting or standing Blood Pressure is within target range for patient.. Pulse  regular and within target range for patient.Marland Kitchen. Respirations  regular, non-labored and within target range.. Temperature is normal and within the target range for the patient.. Patient's appearance is neat and clean. Appears in no acute distress. Well nourished and well developed.. Eyes Conjunctivae clear. No discharge.Marland Kitchen Respiratory Respiratory effort is easy and symmetric bilaterally. Rate is normal at rest and on room air.. Cardiovascular Pedal pulses palpable and strong bilaterally.. Edema is well controlled. Lymphatic None palpable in the popliteal or inguinal area. Psychiatric No evidence of depression, anxiety, or agitation. Calm, cooperative, and communicative. Appropriate interactions and affect.. Notes Would exam; the patient has no open wounds. The remaining wound on the right lateral leg has closed over. There is no surrounding infection. Electronic Signature(s) Signed: 10/21/2016 7:53:49 AM By: Baltazar Najjar MD Entered By: Baltazar Najjar on 10/20/2016 15:17:31 Jesse Wade ([id number]) -------------------------------------------------------------------------------- Physician Orders Details Patient Name: DEITRICH, STEVE. Date of Service: 10/20/2016 2:30 PM Medical Record Patient Account Number: [id number] Number: Treating RN: Curtis Sites 09/13/1965 (51 y.o. Other Clinician: Date of Birth/Sex: Male) Treating ROBSON, MICHAEL Primary Care Provider: Vonita Moss Provider/Extender: G Referring Provider: Vonita Moss Weeks in Treatment: 2 Verbal / Phone Orders: No Diagnosis Coding Discharge From [facility] Services o Discharge from Wound Care Center Electronic Signature(s) Signed: 10/20/2016 4:44:24 PM By: Curtis Sites Signed: 10/21/2016 7:53:49 AM By: Baltazar Najjar MD Entered By: Curtis Sites on 10/20/2016 14:46:57 Jesse Wade ([id number]) -------------------------------------------------------------------------------- Problem List Details Patient Name:  ALEXUS, MICHAEL. Date of Service: 10/20/2016 2:30 PM Medical Record Patient Account Number: [id number] Number: Treating RN: Curtis Sites [birth year] (51 y.o. Other Clinician: Date of Birth/Sex: Male) Treating ROBSON, MICHAEL Primary Care Provider: Vonita Moss Provider/Extender: G Referring Provider: Vonita Moss Weeks in Treatment: 2 Active Problems ICD-10 Encounter Code Description Active Date Diagnosis L97.821 Non-pressure chronic ulcer of other part of left lower leg 10/06/2016 Yes limited to breakdown of skin L97.811 Non-pressure chronic ulcer of other part of right lower leg 10/06/2016 Yes limited to breakdown of skin I87.333 Chronic venous hypertension (idiopathic) with ulcer and 10/06/2016 Yes inflammation of bilateral lower extremity Inactive Problems Resolved Problems Electronic Signature(s) Signed: 10/21/2016 7:53:49 AM By: Baltazar Najjar MD Entered By: Baltazar Najjar on 10/20/2016 15:13:54 Jesse Wade ([id number]) -------------------------------------------------------------------------------- Progress Note Details Patient Name: Jesse Wade. Date of Service: 10/20/2016 2:30 PM Medical Record Patient Account Number: [id number] Number: Treating RN: Curtis Sites [birth year] (51 y.o. Other Clinician: Date of Birth/Sex: Male) Treating ROBSON, MICHAEL Primary Care Provider: Vonita Moss Provider/Extender: G Referring Provider: Vonita Moss Weeks in Treatment: 2 Subjective Chief Complaint Information obtained from Patient 10/06/16; patient is here for wounds on his bilateral lower extremities in the setting of chronic venous insufficiency History of Present Illness (HPI) 10/06/16; this is a 51 year old man who is not a diabetic or a smoker. He has a history of chronic venous insufficiency having had venous ablations on both legs in 2012. These were done locally although I known have the exact reports and I am not exactly sure which  superficial veins he had ablated. In any case he has been left with chronic venous insufficiency with stasis dermatitis. He has had small wounds in the last several years that of mostly healed on their own with minimal interventions. He saw dermatology and was given a combination of nystatin and triamcinolone which seemed to help for a while. He tells me is had a wound on the left medial lower leg for the last several months. He is been putting topical creams on this. He finds this very itchy and  irritating and difficult not to scratch. He also has wounds on the right anterior lower leg and the left lateral lower leg ABIs in this clinic per 1.2 on the right 1.28 on the left. 10/13/16; patient's wounds on the left leg of largely closed over. A small wound on the right anterior leg. We is Prisma tall the wounds wrapped the left leg but left the right leg with border foam the erythema on the medial aspect of the left leg is a lot better. The patient has a history of venous ablations done at the local [facility] but I think his actual surgeon has left. He has seen Dr. Lorretta Harp in the past however. He has 20-30 mm below-knee stockings that he is religiously compliant according to the patient and his wife who is present. He has new stockings at home from [facility] and [place]. 10/20/16; the patient's wounds on his left leg closed over last week. The small remaining wound on the right anterior leg is also closed. He has new compression 20-30 mm stockings that he brought at [facility] in [place]. His edema looks to be well controlled KASTIN, CERDA ([id number]) Objective Constitutional Sitting or standing Blood Pressure is within target range for patient.. Pulse regular and within target range for patient.Marland Kitchen Respirations regular, non-labored and within target range.. Temperature is normal and within the target range for the patient.. Patient's appearance is neat and clean. Appears  in no acute distress. Well nourished and well developed.. Vitals Time Taken: 2:33 PM, Height: 78 in, Weight: 332 lbs, BMI: 38.4, Temperature: 98.4 F, Pulse: 66 bpm, Respiratory Rate: 18 breaths/min, Blood Pressure: 139/83 mmHg. Eyes Conjunctivae clear. No discharge.Marland Kitchen Respiratory Respiratory effort is easy and symmetric bilaterally. Rate is normal at rest and on room air.. Cardiovascular Pedal pulses palpable and strong bilaterally.. Edema is well controlled. Lymphatic None palpable in the popliteal or inguinal area. Psychiatric No evidence of depression, anxiety, or agitation. Calm, cooperative, and communicative. Appropriate interactions and affect.. General Notes: Would exam; the patient has no open wounds. The remaining wound on the right lateral leg has closed over. There is no surrounding infection. Integumentary (Hair, Skin) Wound #1 status is Healed - Epithelialized. Original cause of wound was Gradually Appeared. The wound is located on the Right,Anterior Lower Leg. The wound measures 0cm length x 0cm width x 0cm depth; 0cm^2 area and 0cm^3 volume. Assessment Active Problems ICD-10 L97.821 - Non-pressure chronic ulcer of other part of left lower leg limited to breakdown of skin L97.811 - Non-pressure chronic ulcer of other part of right lower leg limited to breakdown of skin I87.333 - Chronic venous hypertension (idiopathic) with ulcer and inflammation of bilateral lower extremity CASMIR, AUGUSTE ([id number]) Plan Discharge From [facility]: Discharge from Wound Care Center #1 I think the patient can be discharged from the clinic into his own new 20-30 mm compression stockings #2 if he does not maintain skin integrity I think he needs to see vein and vascular again [Dr. Schneir} and perhaps increase compression. He has a history of previous vein ablations Electronic Signature(s) Signed: 10/21/2016 7:53:49 AM By: Baltazar Najjar MD Entered By: Baltazar Najjar on 10/20/2016  15:18:59 Jesse Wade ([id number]) -------------------------------------------------------------------------------- SuperBill Details Patient Name: Jesse Wade. Date of Service: 10/20/2016 Medical Record Patient Account Number: [id number] Number: Treating RN: Curtis Sites 1965/09/29 (51 y.o. Other Clinician: Date of Birth/Sex: Male) Treating ROBSON, MICHAEL Primary Care Provider: Vonita Moss Provider/Extender: G Referring Provider: Vonita Moss Weeks in Treatment: 2  Diagnosis Coding ICD-10 Codes Code Description [phone] Non-pressure chronic ulcer of other part of left lower leg limited to breakdown of skin L97.811 Non-pressure chronic ulcer of other part of right lower leg limited to breakdown of skin Chronic venous hypertension (idiopathic) with ulcer and inflammation of bilateral lower I87.333 extremity Facility Procedures CPT4 Code: [id number] Description: [phone] - WOUND CARE VISIT-LEV 2 EST PT Modifier: Quantity: 1 Physician Procedures CPT4: Description Modifier Quantity Code [id number] - WC PHYS LEVEL 3 - EST PT 1 ICD-10 Description Diagnosis L97.821 Non-pressure chronic ulcer of other part of left lower leg limited to breakdown of skin L97.811 Non-pressure chronic ulcer of other  part of right lower leg limited to breakdown of skin Electronic Signature(s) Signed: 10/21/2016 7:53:49 AM By: Baltazar Najjar MD Entered By: Baltazar Najjar on 10/20/2016 15:19:15

## 2016-10-22 NOTE — Progress Notes (Signed)
Jesse Wade, Jesse Wade ([id number]) Visit Report for 10/20/2016 Arrival Information Details Patient Name: Jesse Wade, Jesse Wade. Date of Service: 10/20/2016 2:30 PM Medical Record Number: [id number] Patient Account Number: [id number] Date of Birth/Sex: Apr 03, 1966 (51 y.o. Male) Treating RN: Curtis Sites Primary Care Yoshio Seliga: Vonita Moss Other Clinician: Referring Mckensi Redinger: Vonita Moss Treating Gideon Burstein/Extender: Altamese [place] in Treatment: 2 Visit Information History Since Last Visit Added or deleted any medications: No Patient Arrived: Ambulatory Any new allergies or adverse reactions: No Arrival Time: 14:32 Had a fall or experienced change in No Accompanied By: spouse activities of daily living that may affect Transfer Assistance: None risk of falls: Patient Identification Verified: Yes Signs or symptoms of abuse/neglect since last No Secondary Verification Process Yes visito Completed: Hospitalized since last visit: No Patient Requires Transmission-Based No Has Dressing in Place as Prescribed: Yes Precautions: Pain Present Now: No Patient Has Alerts: No Electronic Signature(s) Signed: 10/20/2016 4:44:24 PM By: Curtis Sites Entered By: Curtis Sites on 10/20/2016 14:32:57 Jesse Wade ([id number]) -------------------------------------------------------------------------------- Clinic Level of Care Assessment Details Patient Name: Jesse Wade. Date of Service: 10/20/2016 2:30 PM Medical Record Number: [id number] Patient Account Number: [id number] Date of Birth/Sex: 10-09-1965 (51 y.o. Male) Treating RN: Curtis Sites Primary Care Carthel Castille: Vonita Moss Other Clinician: Referring Jillyn Stacey: Vonita Moss Treating Saree Krogh/Extender: Altamese [place] in Treatment: 2 Clinic Level of Care Assessment Items TOOL 4 Quantity Score []  - Use when only an EandM is performed on FOLLOW-UP visit 0 ASSESSMENTS - Nursing Assessment / Reassessment X - Reassessment of  Co-morbidities (includes updates in patient status) 1 10 X - Reassessment of Adherence to Treatment Plan 1 5 ASSESSMENTS - Wound and Skin Assessment / Reassessment X - Simple Wound Assessment / Reassessment - one wound 1 5 []  - Complex Wound Assessment / Reassessment - multiple wounds 0 []  - Dermatologic / Skin Assessment (not related to wound area) 0 ASSESSMENTS - Focused Assessment []  - Circumferential Edema Measurements - multi extremities 0 []  - Nutritional Assessment / Counseling / Intervention 0 X - Lower Extremity Assessment (monofilament, tuning fork, pulses) 1 5 []  - Peripheral Arterial Disease Assessment (using hand held doppler) 0 ASSESSMENTS - Ostomy and/or Continence Assessment and Care []  - Incontinence Assessment and Management 0 []  - Ostomy Care Assessment and Management (repouching, etc.) 0 PROCESS - Coordination of Care X - Simple Patient / Family Education for ongoing care 1 15 []  - Complex (extensive) Patient / Family Education for ongoing care 0 []  - Staff obtains Chiropractor, Records, Test Results / Process Orders 0 []  - Staff telephones HHA, Nursing Homes / Clarify orders / etc 0 []  - Routine Transfer to another Facility (non-emergent condition) 0 Jesse Wade, Jesse Wade ([id number]) []  - Routine Hospital Admission (non-emergent condition) 0 []  - New Admissions / Manufacturing engineer / Ordering NPWT, Apligraf, etc. 0 []  - Emergency Hospital Admission (emergent condition) 0 X - Simple Discharge Coordination 1 10 []  - Complex (extensive) Discharge Coordination 0 PROCESS - Special Needs []  - Pediatric / Minor Patient Management 0 []  - Isolation Patient Management 0 []  - Hearing / Language / Visual special needs 0 []  - Assessment of Community assistance (transportation, D/C planning, etc.) 0 []  - Additional assistance / Altered mentation 0 []  - Support Surface(s) Assessment (bed, cushion, seat, etc.) 0 INTERVENTIONS - Wound Cleansing / Measurement X - Simple Wound  Cleansing - one wound 1 5 []  - Complex Wound Cleansing - multiple wounds 0 X - Wound Imaging (photographs - any number of wounds) 1 5 []  -  Wound Tracing (instead of photographs) 0 X - Simple Wound Measurement - one wound 1 5 []  - Complex Wound Measurement - multiple wounds 0 INTERVENTIONS - Wound Dressings []  - Small Wound Dressing one or multiple wounds 0 []  - Medium Wound Dressing one or multiple wounds 0 []  - Large Wound Dressing one or multiple wounds 0 []  - Application of Medications - topical 0 []  - Application of Medications - injection 0 INTERVENTIONS - Miscellaneous []  - External ear exam 0 Jesse Wade, Jesse Wade ([id number]) []  - Specimen Collection (cultures, biopsies, blood, body fluids, etc.) 0 []  - Specimen(s) / Culture(s) sent or taken to Lab for analysis 0 []  - Patient Transfer (multiple staff / Michiel Sites Lift / Similar devices) 0 []  - Simple Staple / Suture removal (25 or less) 0 []  - Complex Staple / Suture removal (26 or more) 0 []  - Hypo / Hyperglycemic Management (close monitor of Blood Glucose) 0 []  - Ankle / Brachial Index (ABI) - do not check if billed separately 0 X - Vital Signs 1 5 Has the patient been seen at the hospital within the last three years: Yes Total Score: 70 Level Of Care: New/Established - Level 2 Electronic Signature(s) Signed: 10/20/2016 4:44:24 PM By: Curtis Sites Entered By: Curtis Sites on 10/20/2016 14:48:00 Jesse Wade ([id number]) -------------------------------------------------------------------------------- Encounter Discharge Information Details Patient Name: Jesse Wade. Date of Service: 10/20/2016 2:30 PM Medical Record Number: [id number] Patient Account Number: [id number] Date of Birth/Sex: 08/11/1965 (51 y.o. Male) Treating RN: Curtis Sites Primary Care Danasia Baker: Vonita Moss Other Clinician: Referring Jyron Turman: Vonita Moss Treating Tru Rana/Extender: Altamese [place] in Treatment: 2 Encounter Discharge Information  Items Discharge Pain Level: 0 Discharge Condition: Stable Ambulatory Status: Ambulatory Discharge Destination: Home Transportation: Private Auto Accompanied By: spouse Schedule Follow-up Appointment: No Medication Reconciliation completed and provided to Patient/Care No Daneil Beem: Provided on Clinical Summary of Care: 10/20/2016 Form Type Recipient Paper Patient JA Electronic Signature(s) Signed: 10/20/2016 2:54:24 PM By: Curtis Sites Previous Signature: 10/20/2016 2:49:10 PM Version By: Gwenlyn Perking Entered By: Curtis Sites on 10/20/2016 14:54:23 Jesse Wade ([id number]) -------------------------------------------------------------------------------- Lower Extremity Assessment Details Patient Name: Jesse Wade. Date of Service: 10/20/2016 2:30 PM Medical Record Number: [id number] Patient Account Number: [id number] Date of Birth/Sex: 08-20-1965 (51 y.o. Male) Treating RN: Curtis Sites Primary Care Seabron Iannello: Vonita Moss Other Clinician: Referring Emmons Toth: Vonita Moss Treating Jonet Mathies/Extender: Altamese [place] in Treatment: 2 Edema Assessment Assessed: [Left: No] [Right: No] E[Left: dema] [Right: :] Calf Left: Right: Point of Measurement: 43 cm From Medial Instep cm 44 cm Ankle Left: Right: Point of Measurement: 9 cm From Medial Instep cm 29 cm Vascular Assessment Pulses: Dorsalis Pedis Palpable: [Right:Yes] Posterior Tibial Extremity colors, hair growth, and conditions: Extremity Color: [Right:Hyperpigmented] Hair Growth on Extremity: [Right:Yes] Temperature of Extremity: [Right:Warm] Capillary Refill: [Right:< 3 seconds] Electronic Signature(s) Signed: 10/20/2016 4:44:24 PM By: Curtis Sites Entered By: Curtis Sites on 10/20/2016 14:38:57 Jesse Wade ([id number]) -------------------------------------------------------------------------------- Multi Wound Chart Details Patient Name: Jesse Wade. Date of Service: 10/20/2016 2:30 PM Medical  Record Number: [id number] Patient Account Number: [id number] Date of Birth/Sex: 1965/07/19 (51 y.o. Male) Treating RN: Curtis Sites Primary Care Josafat Enrico: Vonita Moss Other Clinician: Referring Dorita Rowlands: Vonita Moss Treating Beronica Lansdale/Extender: Altamese [place] in Treatment: 2 Vital Signs Height(in): 78 Pulse(bpm): 66 Weight(lbs): 332 Blood Pressure 139/83 (mmHg): Body Mass Index(BMI): 38 Temperature(F): 98.4 Respiratory Rate 18 (breaths/min): Photos: [N/A:N/A] Wound Location: Right, Anterior Lower Leg N/A N/A Wounding Event: Gradually Appeared N/A N/A Primary Etiology:  Venous Leg Ulcer N/A N/A Date Acquired: 09/21/2016 N/A N/A Weeks of Treatment: 2 N/A N/A Wound Status: Healed - Epithelialized N/A N/A Measurements L x W x D 0x0x0 N/A N/A (cm) Area (cm) : 0 N/A N/A Volume (cm) : 0 N/A N/A % Reduction in Area: 100.00% N/A N/A % Reduction in Volume: 100.00% N/A N/A Classification: Partial Thickness N/A N/A Periwound Skin Texture: No Abnormalities Noted N/A N/A Periwound Skin No Abnormalities Noted N/A N/A Moisture: Periwound Skin Color: No Abnormalities Noted N/A N/A Tenderness on No N/A N/A Palpation: Treatment Notes Jesse MoodyIKEN, Json D. ([id number]) Electronic Signature(s) Signed: 10/21/2016 7:53:49 AM By: Baltazar Najjarobson, Michael MD Entered By: Baltazar Najjarobson, Michael on 10/20/2016 15:14:06 Jesse MoodyAIKEN, Diyan D. ([id number]) -------------------------------------------------------------------------------- Multi-Disciplinary Care Plan Details Patient Name: Jesse MoodyIKEN, Maxim D. Date of Service: 10/20/2016 2:30 PM Medical Record Number: [id number] Patient Account Number: [id number] Date of Birth/Sex: 08-22-1965 (51 y.o. Male) Treating RN: Curtis Sitesorthy, Joanna Primary Care Reighlyn Elmes: Vonita MossRISSMAN, MARK Other Clinician: Referring Orlanda Frankum: Vonita MossRISSMAN, MARK Treating Tjuana Vickrey/Extender: Altamese CarolinaOBSON, MICHAEL G Weeks in Treatment: 2 Active Inactive Electronic Signature(s) Signed: 10/20/2016 4:44:24 PM By: Curtis Sitesorthy,  Joanna Entered By: Curtis Sitesorthy, Joanna on 10/20/2016 14:46:38 Jesse MoodyAIKEN, Zakee D. ([id number]) -------------------------------------------------------------------------------- Pain Assessment Details Patient Name: Jesse MoodyAIKEN, Deontez D. Date of Service: 10/20/2016 2:30 PM Medical Record Number: [id number] Patient Account Number: [id number] Date of Birth/Sex: 08-22-1965 ([age] y.o. Male) Treating RN: Curtis Sitesorthy, Joanna Primary Care Amandalee Lacap: Vonita MossRISSMAN, MARK Other Clinician: Referring Danniella Robben: Vonita MossRISSMAN, MARK Treating Brielle Moro/Extender: Altamese CarolinaOBSON, MICHAEL G Weeks in Treatment: 2 Active Problems Location of Pain Severity and Description of Pain Patient Has Paino No Site Locations Pain Management and Medication Current Pain Management: Notes Topical or injectable lidocaine is offered to patient for acute pain when surgical debridement is performed. If needed, Patient is instructed to use over the counter pain medication for the following 24-48 hours after debridement. Wound care MDs do not prescribed pain medications. Patient has chronic pain or uncontrolled pain. Patient has been instructed to make an appointment with their Primary Care Physician for pain management. Electronic Signature(s) Signed: 10/20/2016 4:44:24 PM By: Curtis Sitesorthy, Joanna Entered By: Curtis Sitesorthy, Joanna on 10/20/2016 14:33:07 Jesse MoodyAIKEN, Kailo D. ([id number]) -------------------------------------------------------------------------------- Patient/Caregiver Education Details Patient Name: Jesse MoodyIKEN, Sonia D. Date of Service: 10/20/2016 2:30 PM Medical Record Patient Account Number: [id number] Number: Treating RN: Curtis SitesDorthy, Joanna 08-22-1965 (50 y.o. Other Clinician: Date of Birth/Gender: Male) Treating ROBSON, MICHAEL Primary Care Physician: Vonita MossRISSMAN, MARK Physician/Extender: G Referring Physician: Veverly FellsRISSMAN, MARK Weeks in Treatment: 2 Education Assessment Education Provided To: Patient Education Topics Provided Venous: Handouts: Other: continue  wiht compression daily Methods: Explain/Verbal Responses: State content correctly Electronic Signature(s) Signed: 10/20/2016 4:44:24 PM By: Curtis Sitesorthy, Joanna Entered By: Curtis Sitesorthy, Joanna on 10/20/2016 14:54:43 Jesse MoodyAIKEN, Jade D. ([id number]) -------------------------------------------------------------------------------- Wound Assessment Details Patient Name: Jesse MoodyAIKEN, Mizael D. Date of Service: 10/20/2016 2:30 PM Medical Record Number: [id number] Patient Account Number: [id number] Date of Birth/Sex: 08-22-1965 (51 y.o. Male) Treating RN: Curtis Sitesorthy, Joanna Primary Care Jordan Pardini: Vonita MossRISSMAN, MARK Other Clinician: Referring Taylan Marez: Vonita MossRISSMAN, MARK Treating Araly Kaas/Extender: Altamese CarolinaOBSON, MICHAEL G Weeks in Treatment: 2 Wound Status Wound Number: 1 Primary Etiology: Venous Leg Ulcer Wound Location: Right, Anterior Lower Leg Wound Status: Healed - Epithelialized Wounding Event: Gradually Appeared Date Acquired: 09/21/2016 Weeks Of Treatment: 2 Clustered Wound: No Photos Photo Uploaded By: Curtis Sitesorthy, Joanna on 10/20/2016 14:44:23 Wound Measurements Length: (cm) 0 % Reduction Width: (cm) 0 % Reduction Depth: (cm) 0 Area: (cm) 0 Volume: (cm) 0 in Area: 100% in Volume: 100% Wound Description Classification: Partial Thickness Periwound Skin Texture Texture Color No Abnormalities Noted: No No  Abnormalities Noted: No Moisture No Abnormalities Noted: No Electronic Signature(s) Signed: 10/20/2016 4:44:24 PM By: Wilson Singer, Beulah Gandy ([id number]) Entered By: Curtis Sites on 10/20/2016 14:43:09 Jesse Wade ([id number]) -------------------------------------------------------------------------------- Vitals Details Patient Name: Jesse Wade. Date of Service: 10/20/2016 2:30 PM Medical Record Number: [id number] Patient Account Number: [id number] Date of Birth/Sex: [birth year] (51 y.o. Male) Treating RN: Curtis Sites Primary Care Adriena Manfre: Vonita Moss Other Clinician: Referring Walta [place]:  Vonita Moss Treating Tanelle Lanzo/Extender: Altamese [place] in Treatment: 2 Vital Signs Time Taken: 14:33 Temperature (F): 98.4 Height (in): 78 Pulse (bpm): 66 Weight (lbs): 332 Respiratory Rate (breaths/min): 18 Body Mass Index (BMI): 38.4 Blood Pressure (mmHg): 139/83 Reference Range: 80 - 120 mg / dl Electronic Signature(s) Signed: 10/20/2016 4:44:24 PM By: Curtis Sites Entered By: Curtis Sites on 10/20/2016 14:35:06

## 2017-01-13 ENCOUNTER — Ambulatory Visit: Payer: BLUE CROSS/BLUE SHIELD | Admitting: Internal Medicine

## 2017-03-11 DIAGNOSIS — H1045 Other chronic allergic conjunctivitis: Secondary | ICD-10-CM | POA: Diagnosis not present

## 2017-03-11 DIAGNOSIS — J3081 Allergic rhinitis due to animal (cat) (dog) hair and dander: Secondary | ICD-10-CM | POA: Diagnosis not present

## 2017-03-11 DIAGNOSIS — J3089 Other allergic rhinitis: Secondary | ICD-10-CM | POA: Diagnosis not present

## 2017-05-03 ENCOUNTER — Other Ambulatory Visit: Payer: Self-pay | Admitting: *Deleted

## 2017-05-03 ENCOUNTER — Other Ambulatory Visit: Payer: Self-pay | Admitting: Family Medicine

## 2017-05-08 ENCOUNTER — Other Ambulatory Visit: Payer: Self-pay | Admitting: Family Medicine

## 2017-05-10 NOTE — Telephone Encounter (Signed)
Refill request, needs office visit.

## 2017-05-10 NOTE — Telephone Encounter (Signed)
Your patient 

## 2017-05-10 NOTE — Telephone Encounter (Signed)
LMOVM for patient to call the to schedule an appointment

## 2017-06-02 DIAGNOSIS — M12571 Traumatic arthropathy, right ankle and foot: Secondary | ICD-10-CM | POA: Diagnosis not present

## 2017-06-02 DIAGNOSIS — M79671 Pain in right foot: Secondary | ICD-10-CM | POA: Diagnosis not present

## 2017-06-02 DIAGNOSIS — T849XXD Unspecified complication of internal orthopedic prosthetic device, implant and graft, subsequent encounter: Secondary | ICD-10-CM | POA: Diagnosis not present

## 2017-06-02 DIAGNOSIS — Z981 Arthrodesis status: Secondary | ICD-10-CM | POA: Diagnosis not present

## 2017-06-09 ENCOUNTER — Telehealth: Payer: Self-pay | Admitting: Family Medicine

## 2017-06-09 NOTE — Telephone Encounter (Signed)
Received fax from [facility] requesting refill on Benazepril HCL 40 mg tablet with quantity 90.

## 2017-06-23 ENCOUNTER — Encounter: Payer: BLUE CROSS/BLUE SHIELD | Attending: Internal Medicine | Admitting: Internal Medicine

## 2017-06-23 DIAGNOSIS — L97221 Non-pressure chronic ulcer of left calf limited to breakdown of skin: Secondary | ICD-10-CM | POA: Insufficient documentation

## 2017-06-23 DIAGNOSIS — S81801A Unspecified open wound, right lower leg, initial encounter: Secondary | ICD-10-CM | POA: Diagnosis not present

## 2017-06-23 DIAGNOSIS — I87333 Chronic venous hypertension (idiopathic) with ulcer and inflammation of bilateral lower extremity: Secondary | ICD-10-CM | POA: Diagnosis not present

## 2017-06-23 DIAGNOSIS — G4730 Sleep apnea, unspecified: Secondary | ICD-10-CM | POA: Diagnosis not present

## 2017-06-23 DIAGNOSIS — L97222 Non-pressure chronic ulcer of left calf with fat layer exposed: Secondary | ICD-10-CM | POA: Diagnosis not present

## 2017-06-23 DIAGNOSIS — I87332 Chronic venous hypertension (idiopathic) with ulcer and inflammation of left lower extremity: Secondary | ICD-10-CM | POA: Diagnosis not present

## 2017-06-23 DIAGNOSIS — L97211 Non-pressure chronic ulcer of right calf limited to breakdown of skin: Secondary | ICD-10-CM | POA: Insufficient documentation

## 2017-06-23 DIAGNOSIS — I10 Essential (primary) hypertension: Secondary | ICD-10-CM | POA: Insufficient documentation

## 2017-06-24 NOTE — Progress Notes (Signed)
Jesse, Wade ([id number]) Visit Report for 06/23/2017 Chief Complaint Document Details Patient Name: Jesse, Wade. Date of Service: 06/23/2017 8:00 AM Medical Record Number: [id number] Patient Account Number: [id number] Date of Birth/Sex: 21-Oct-1965 (52 y.o. Male) Treating RN: Curtis Sites Primary Care Provider: Vonita Moss Other Clinician: Referring Provider: Vonita Moss Treating Provider/Extender: Maxwell Caul Weeks in Treatment: 0 Information Obtained from: Patient Chief Complaint 10/06/16; patient is here for wounds on his bilateral lower extremities in the setting of chronic venous insufficiency 06/23/17; patient returns to clinic for review of lower extremity wounds left greater than right. Electronic Signature(s) Signed: 06/23/2017 5:48:27 PM By: Baltazar Najjar MD Entered By: Baltazar Najjar on 06/23/2017 09:22:12 Jesse Wade ([id number]) -------------------------------------------------------------------------------- HPI Details Patient Name: Jesse, Wade. Date of Service: 06/23/2017 8:00 AM Medical Record Number: [id number] Patient Account Number: [id number] Date of Birth/Sex: 1966-01-13 (52 y.o. Male) Treating RN: Curtis Sites Primary Care Provider: Vonita Moss Other Clinician: Referring Provider: Vonita Moss Treating Provider/Extender: Maxwell Caul Weeks in Treatment: 0 History of Present Illness HPI Description: 10/06/16; this is a 52 year old man who is not a diabetic or a smoker. He has a history of chronic venous insufficiency having had venous ablations on both legs in 2012. These were done locally although I known have the exact reports and I am not exactly sure which superficial veins he had ablated. In any case he has been left with chronic venous insufficiency with stasis dermatitis. He has had small wounds in the last several years that of mostly healed on their own with minimal interventions. He saw dermatology and was given a combination of  nystatin and triamcinolone which seemed to help for a while. He tells me is had a wound on the left medial lower leg for the last several months. He is been putting topical creams on this. He finds this very itchy and irritating and difficult not to scratch. He also has wounds on the right anterior lower leg and the left lateral lower leg ABIs in this clinic per 1.2 on the right 1.28 on the left. 10/13/16; patient's wounds on the left leg of largely closed over. A small wound on the right anterior leg. We is Prisma tall the wounds wrapped the left leg but left the right leg with border foam the erythema on the medial aspect of the left leg is a lot better. The patient has a history of venous ablations done at the local [facility] but I think his actual surgeon has left. He has seen Dr. Lorretta Harp in the past however. He has 20-30 mm below-knee stockings that he is religiously compliant according to the patient and his wife who is present. He has new stockings at home from [facility] and [place]. 10/20/16; the patient's wounds on his left leg closed over last week. The small remaining wound on the right anterior leg is also closed. He has new compression 20-30 mm stockings that he brought at [facility] in [place]. His edema looks to be well controlled READMISSION 06/23/17;patient returns to clinic now with a 2 month history of bilateral lower extremity wounds. He has 20-30 mm compression stockings and he has been compliant. The area on the left is much more extensive than the area on the right there is no real pain but he states that it is pruritic. He has a history of venous stasis dermatitis of the lower extremities.he has had vascular procedures in both legs in 2012 by [facility]. He is  not a diabetic is had no prior arterial studies. Previous ABIs in our clinic were both within the normal range at 1.2 Electronic Signature(s) Signed: 06/23/2017 5:48:27 PM By:  Baltazar Najjar MD Entered By: Baltazar Najjar on 06/23/2017 09:26:44 Jesse Wade ([id number]) -------------------------------------------------------------------------------- Physical Exam Details Patient Name: Jesse Wade. Date of Service: 06/23/2017 8:00 AM Medical Record Number: [id number] Patient Account Number: [id number] Date of Birth/Sex: January 13, 1966 (52 y.o. Male) Treating RN: Curtis Sites Primary Care Provider: Vonita Moss Other Clinician: Referring Provider: Vonita Moss Treating Provider/Extender: Maxwell Caul Weeks in Treatment: 0 Constitutional Patient is hypertensive.. Pulse regular and within target range for patient.Marland Kitchen Respirations regular, non-labored and within target range.. Temperature is normal and within the target range for the patient.Marland Kitchen appears in no distress. Eyes Conjunctivae clear. No discharge. Respiratory Respiratory effort is easy and symmetric bilaterally. Rate is normal at rest and on room air.. Cardiovascular Heart rhythm and rate regular, without murmur or gallop.. Femoral arteries without bruits and pulses strong.. Pedal pulses palpable and strong bilaterally.. he has varicose veins in the left thigh and also the right foot.. Lymphatic none palpable in the popliteal or inguinal area. Integumentary (Hair, Skin) no systemic skin issues are seen. Psychiatric No evidence of depression, anxiety, or agitation. Calm, cooperative, and communicative. Appropriate interactions and affect.. Notes wound exam; the patient has a substantial but superficial wound area was significant inflammation on the left medial calf. Much smaller area over the right medial malleolus. There is no evidence of surrounding infection. This likely represents significant bilateral stasis dermatitis with epithelial loss. There was nothing to suggest infection or need for antibiotics Electronic Signature(s) Signed: 06/23/2017 5:48:27 PM By: Baltazar Najjar MD Entered By:  Baltazar Najjar on 06/23/2017 09:29:02 Jesse Wade ([id number]) -------------------------------------------------------------------------------- Physician Orders Details Patient Name: Jesse Wade. Date of Service: 06/23/2017 8:00 AM Medical Record Number: [id number] Patient Account Number: [id number] Date of Birth/Sex: 04-02-1966 (52 y.o. Male) Treating RN: Curtis Sites Primary Care Provider: Vonita Moss Other Clinician: Referring Provider: Vonita Moss Treating Provider/Extender: Altamese [place] in Treatment: 0 Verbal / Phone Orders: No Diagnosis Coding Wound Cleansing Wound #4 Right,Medial Lower Leg o May Shower, gently pat wound dry prior to applying new dressing. Wound #5 Left,Medial Lower Leg o May shower with protection. Skin Barriers/Peri-Wound Care Wound #4 Right,Medial Lower Leg o Triamcinolone Acetonide Ointment Wound #5 Left,Medial Lower Leg o Triamcinolone Acetonide Ointment Primary Wound Dressing Wound #4 Right,Medial Lower Leg o Silvercel Non-Adherent Wound #5 Left,Medial Lower Leg o Silvercel Non-Adherent Secondary Dressing Wound #4 Right,Medial Lower Leg o Boardered Foam Dressing Wound #5 Left,Medial Lower Leg o ABD pad Dressing Change Frequency Wound #4 Right,Medial Lower Leg o Change dressing every day. Wound #5 Left,Medial Lower Leg o Change dressing every week Follow-up Appointments Wound #4 Right,Medial Lower Leg o Return Appointment in 1 week. Wound #5 Left,Medial Lower Leg o Return Appointment in 1 week. Edema Control PARLEY, PIDCOCK ([id number]) Wound #4 Right,Medial Lower Leg o Patient to wear own compression stockings Wound #5 Left,Medial Lower Leg o 3 Layer Compression System - Left Lower Extremity Consults o Vascular Electronic Signature(s) Signed: 06/23/2017 5:10:34 PM By: Curtis Sites Signed: 06/23/2017 5:48:27 PM By: Baltazar Najjar MD Entered By: Curtis Sites on 06/23/2017  08:44:48 Jesse Wade ([id number]) -------------------------------------------------------------------------------- Problem List Details Patient Name: Jesse Wade, Jesse Wade. Date of Service: 06/23/2017 8:00 AM Medical Record Number: [id number] Patient Account Number: [id number] Date of Birth/Sex: 03-Jul-1965 (52 y.o. Male) Treating RN: Curtis Sites Primary Care Provider: Vonita Moss  Other Clinician: Referring Provider: Vonita MossRISSMAN, MARK Treating Provider/Extender: Altamese CarolinaOBSON, Vickie Melnik G Weeks in Treatment: 0 Active Problems ICD-10 Encounter Code Description Active Date Diagnosis I87.333 Chronic venous hypertension (idiopathic) with ulcer and 06/23/2017 Yes inflammation of bilateral lower extremity L97.221 Non-pressure chronic ulcer of left calf limited to breakdown of skin 06/23/2017 Yes L97.211 Non-pressure chronic ulcer of right calf limited to breakdown of skin 06/23/2017 Yes Inactive Problems Resolved Problems Electronic Signature(s) Signed: 06/23/2017 5:48:27 PM By: Baltazar Najjarobson, Blima Jaimes MD Entered By: Baltazar Najjarobson, Wanya Bangura on 06/23/2017 09:21:19 Jesse Wade, Jesse D. ([id number]) -------------------------------------------------------------------------------- Progress Note Details Patient Name: Jesse Wade, Jesse D. Date of Service: 06/23/2017 8:00 AM Medical Record Number: [id number] Patient Account Number: [id number] Date of Birth/Sex: 1966/01/15 8(51 y.o. Male) Treating RN: Curtis Sitesorthy, Joanna Primary Care Provider: Vonita MossRISSMAN, MARK Other Clinician: Referring Provider: Vonita MossRISSMAN, MARK Treating Provider/Extender: Maxwell CaulOBSON, Honour Schwieger G Weeks in Treatment: 0 Subjective Chief Complaint Information obtained from Patient 10/06/16; patient is here for wounds on his bilateral lower extremities in the setting of chronic venous insufficiency 06/23/17; patient returns to clinic for review of lower extremity wounds left greater than right. History of Present Illness (HPI) 10/06/16; this is a 52 year old man who is not a diabetic or a smoker.  He has a history of chronic venous insufficiency having had venous ablations on both legs in 2012. These were done locally although I known have the exact reports and I am not exactly sure which superficial veins he had ablated. In any case he has been left with chronic venous insufficiency with stasis dermatitis. He has had small wounds in the last several years that of mostly healed on their own with minimal interventions. He saw dermatology and was given a combination of nystatin and triamcinolone which seemed to help for a while. He tells me is had a wound on the left medial lower leg for the last several months. He is been putting topical creams on this. He finds this very itchy and irritating and difficult not to scratch. He also has wounds on the right anterior lower leg and the left lateral lower leg ABIs in this clinic per 1.2 on the right 1.28 on the left. 10/13/16; patient's wounds on the left leg of largely closed over. A small wound on the right anterior leg. We is Prisma tall the wounds wrapped the left leg but left the right leg with border foam the erythema on the medial aspect of the left leg is a lot better. The patient has a history of venous ablations done at the local vein and vascular group but I think his actual surgeon has left. He has seen Dr. Lorretta HarpSchneir in the past however. He has 20-30 mm below-knee stockings that he is religiously compliant according to the patient and his wife who is present. He has new stockings at home from [facility] and [place]. 10/20/16; the patient's wounds on his left leg closed over last week. The small remaining wound on the right anterior leg is also closed. He has new compression 20-30 mm stockings that he brought at [facility] in [place]. His edema looks to be well controlled READMISSION 06/23/17;patient returns to clinic now with a 2 month history of bilateral lower extremity wounds. He has 20-30 mm compression stockings and he has  been compliant. The area on the left is much more extensive than the area on the right there is no real pain but he states that it is pruritic. He has a history of venous stasis dermatitis of the lower extremities.he has had vascular procedures in  both legs in 2012 by [facility]. He is not a diabetic is had no prior arterial studies. Previous ABIs in our clinic were both within the normal range at 1.2 Wound History Patient presents with 1 open wound that has been present for approximately 2 months. Patient has been treating wound in the following manner: neosporin. Laboratory tests have not been performed in the last month. Patient reportedly has not tested positive for an antibiotic resistant organism. Patient reportedly has not tested positive for osteomyelitis. Patient reportedly has not had testing performed to evaluate circulation in the legs. Patient History Information obtained from Patient. Jesse Wade, Jesse Wade ([id number]) Allergies Seasonal allergies, penicillin Family History Heart Disease - Father,Siblings,Paternal Grandparents, Hypertension - Siblings,Mother,Father, Stroke - Siblings,Mother,Maternal Grandparents, No family history of Cancer, Diabetes, Hereditary Spherocytosis, Kidney Disease, Lung Disease, Seizures, Thyroid Problems, Tuberculosis. Social History Never smoker, Marital Status - Married, Alcohol Use - Never, Drug Use - No History, Caffeine Use - Moderate. Medical History Respiratory Patient has history of Sleep Apnea - CPAP Medical And Surgical History Notes Musculoskeletal multiple ankle surgeries Review of Systems (ROS) Eyes The patient has no complaints or symptoms. Ear/Nose/Mouth/Throat The patient has no complaints or symptoms. Hematologic/Lymphatic The patient has no complaints or symptoms. Cardiovascular The patient has no complaints or symptoms. Gastrointestinal The patient has no complaints or symptoms. Endocrine The patient has no  complaints or symptoms. Genitourinary The patient has no complaints or symptoms. Immunological The patient has no complaints or symptoms. Integumentary (Skin) The patient has no complaints or symptoms. Musculoskeletal The patient has no complaints or symptoms. Neurologic The patient has no complaints or symptoms. Oncologic The patient has no complaints or symptoms. Psychiatric The patient has no complaints or symptoms. Objective Jesse Wade, Jesse Wade ([id number]) Constitutional Patient is hypertensive.. Pulse regular and within target range for patient.Marland Kitchen Respirations regular, non-labored and within target range.. Temperature is normal and within the target range for the patient.Marland Kitchen appears in no distress. Vitals Time Taken: 8:12 AM, Height: 77 in, Source: Measured, Weight: 305 lbs, Source: Measured, BMI: 36.2, Temperature: 97.9 F, Pulse: 62 bpm, Respiratory Rate: 18 breaths/min, Blood Pressure: 141/81 mmHg. Eyes Conjunctivae clear. No discharge. Respiratory Respiratory effort is easy and symmetric bilaterally. Rate is normal at rest and on room air.. Cardiovascular Heart rhythm and rate regular, without murmur or gallop.. Femoral arteries without bruits and pulses strong.. Pedal pulses palpable and strong bilaterally.. he has varicose veins in the left thigh and also the right foot.. Lymphatic none palpable in the popliteal or inguinal area. Psychiatric No evidence of depression, anxiety, or agitation. Calm, cooperative, and communicative. Appropriate interactions and affect.. General Notes: wound exam; the patient has a substantial but superficial wound area was significant inflammation on the left medial calf. Much smaller area over the right medial malleolus. There is no evidence of surrounding infection. This likely represents significant bilateral stasis dermatitis with epithelial loss. There was nothing to suggest infection or need for antibiotics Integumentary (Hair, Skin) no  systemic skin issues are seen. Wound #4 status is Open. Original cause of wound was Gradually Appeared. The wound is located on the Right,Medial Lower Leg. The wound measures 1.2cm length x 1cm width x 0.1cm depth; 0.942cm^2 area and 0.094cm^3 volume. There is no tunneling or undermining noted. There is a medium amount of serous drainage noted. The wound margin is flat and intact. There is large (67-100%) pink granulation within the wound bed. There is no necrotic tissue within the wound bed. The periwound skin appearance exhibited: Hemosiderin  Staining. The periwound skin appearance did not exhibit: Callus, Crepitus, Excoriation, Induration, Rash, Scarring, Dry/Scaly, Maceration, Atrophie Blanche, Cyanosis, Ecchymosis, Mottled, Pallor, Rubor, Erythema. Periwound temperature was noted as No Abnormality. Wound #5 status is Open. Original cause of wound was Gradually Appeared. The wound is located on the Left,Medial Lower Leg. The wound measures 9.2cm length x 11.3cm width x 0.1cm depth; 81.65cm^2 area and 8.165cm^3 volume. There is no tunneling or undermining noted. There is a medium amount of serous drainage noted. The wound margin is flat and intact. There is large (67-100%) pink granulation within the wound bed. There is a small (1-33%) amount of necrotic tissue within the wound bed including Eschar. The periwound skin appearance exhibited: Hemosiderin Staining. The periwound skin appearance did not exhibit: Callus, Crepitus, Excoriation, Induration, Rash, Scarring, Dry/Scaly, Maceration, Atrophie Blanche, Cyanosis, Ecchymosis, Mottled, Pallor, Rubor, Erythema. Periwound temperature was noted as No Abnormality. Assessment Active Problems ICD-10 I87.333 - Chronic venous hypertension (idiopathic) with ulcer and inflammation of bilateral lower extremity Jesse Wade, Jesse Wade ([id number]) [phone] - Non-pressure chronic ulcer of left calf limited to breakdown of skin L97.211 - Non-pressure chronic ulcer of  right calf limited to breakdown of skin Procedures Wound #5 Pre-procedure diagnosis of Wound #5 is a Venous Leg Ulcer located on the Left,Medial Lower Leg . There was a Three Layer Compression Therapy Procedure with a pre-treatment ABI of 1.2 by Curtis Sites, RN. Post procedure Diagnosis Wound #5: Same as Pre-Procedure Plan Wound Cleansing: Wound #4 Right,Medial Lower Leg: May Shower, gently pat wound dry prior to applying new dressing. Wound #5 Left,Medial Lower Leg: May shower with protection. Skin Barriers/Peri-Wound Care: Wound #4 Right,Medial Lower Leg: Triamcinolone Acetonide Ointment Wound #5 Left,Medial Lower Leg: Triamcinolone Acetonide Ointment Primary Wound Dressing: Wound #4 Right,Medial Lower Leg: Silvercel Non-Adherent Wound #5 Left,Medial Lower Leg: Silvercel Non-Adherent Secondary Dressing: Wound #4 Right,Medial Lower Leg: Boardered Foam Dressing Wound #5 Left,Medial Lower Leg: ABD pad Dressing Change Frequency: Wound #4 Right,Medial Lower Leg: Change dressing every day. Wound #5 Left,Medial Lower Leg: Change dressing every week Follow-up Appointments: Wound #4 Right,Medial Lower Leg: Return Appointment in 1 week. Wound #5 Left,Medial Lower Leg: Return Appointment in 1 week. Edema Control: Wound #4 Right,Medial Lower Leg: Patient to wear own compression stockings Wound #5 Left,Medial Lower Leg: 3 Layer Compression System - Left Lower Extremity Consults ordered were: HIROKI, WINT ([id number]) Vascular #1 TCA silver alginate to both wound areas #23 layer compression for the left leg, border foam to the right with his own stocking #3 I'm going to send this patient back to [facility] for their review. He has had ablations in the past but I have no more information on that right now. Electronic Signature(s) Signed: 06/23/2017 5:48:27 PM By: Baltazar Najjar MD Entered By: Baltazar Najjar on 06/23/2017 09:29:57 Jesse Wade  ([id number]) -------------------------------------------------------------------------------- ROS/PFSH Details Patient Name: Jesse Wade, Jesse Wade. Date of Service: 06/23/2017 8:00 AM Medical Record Number: [id number] Patient Account Number: [id number] Date of Birth/Sex: 1965-10-28 (52 y.o. Male) Treating RN: Curtis Sites Primary Care Provider: Vonita Moss Other Clinician: Referring Provider: Vonita Moss Treating Provider/Extender: Maxwell Caul Weeks in Treatment: 0 Information Obtained From Patient Wound History Do you currently have one or more open woundso Yes How many open wounds do you currently haveo 1 Approximately how long have you had your woundso 2 months How have you been treating your wound(s) until nowo neosporin Has your wound(s) ever healed and then re-openedo No Have you had any lab work done in  the past montho No Have you tested positive for an antibiotic resistant organism (MRSA, VRE)o No Have you tested positive for osteomyelitis (bone infection)o No Have you had any tests for circulation on your legso No Eyes Complaints and Symptoms: No Complaints or Symptoms Medical History: Negative for: Cataracts; Glaucoma; Optic Neuritis Ear/Nose/Mouth/Throat Complaints and Symptoms: No Complaints or Symptoms Medical History: Negative for: Chronic sinus problems/congestion; Middle ear problems Hematologic/Lymphatic Complaints and Symptoms: No Complaints or Symptoms Medical History: Negative for: Anemia; Hemophilia; Human Immunodeficiency Virus; Lymphedema; Sickle Cell Disease Respiratory Medical History: Positive for: Sleep Apnea - CPAP Negative for: Aspiration; Asthma; Chronic Obstructive Pulmonary Disease (COPD); Pneumothorax; Tuberculosis Cardiovascular Complaints and Symptoms: No Complaints or Symptoms Medical History: Positive for: Hypertension Jesse Wade, KAUFHOLD ([id number]) Negative for: Angina; Arrhythmia; Congestive Heart Failure; Coronary Artery Disease;  Deep Vein Thrombosis; Hypotension; Myocardial Infarction; Peripheral Arterial Disease; Peripheral Venous Disease; Phlebitis; Vasculitis Gastrointestinal Complaints and Symptoms: No Complaints or Symptoms Medical History: Negative for: Cirrhosis ; Colitis; Crohnos; Hepatitis A; Hepatitis B; Hepatitis C Endocrine Complaints and Symptoms: No Complaints or Symptoms Medical History: Negative for: Type I Diabetes; Type II Diabetes Genitourinary Complaints and Symptoms: No Complaints or Symptoms Medical History: Negative for: End Stage Renal Disease Immunological Complaints and Symptoms: No Complaints or Symptoms Medical History: Negative for: Lupus Erythematosus; Raynaudos; Scleroderma Integumentary (Skin) Complaints and Symptoms: No Complaints or Symptoms Medical History: Negative for: History of Burn; History of pressure wounds Musculoskeletal Complaints and Symptoms: No Complaints or Symptoms Medical History: Negative for: Gout; Rheumatoid Arthritis; Osteoarthritis; Osteomyelitis Past Medical History Notes: multiple ankle surgeries Neurologic Complaints and Symptoms: No Complaints or Symptoms Medical History: MYLEZ, VENABLE ([id number]) Negative for: Dementia; Neuropathy; Quadriplegia; Paraplegia; Seizure Disorder Oncologic Complaints and Symptoms: No Complaints or Symptoms Medical History: Negative for: Received Chemotherapy; Received Radiation Psychiatric Complaints and Symptoms: No Complaints or Symptoms Medical History: Negative for: Anorexia/bulimia; Confinement Anxiety Immunizations Pneumococcal Vaccine: Received Pneumococcal Vaccination: No Implantable Devices Family and Social History Cancer: No; Diabetes: No; Heart Disease: Yes - Father,Siblings,Paternal Grandparents; Hereditary Spherocytosis: No; Hypertension: Yes - Siblings,Mother,Father; Kidney Disease: No; Lung Disease: No; Seizures: No; Stroke: Yes - Siblings,Mother,Maternal Grandparents; Thyroid  Problems: No; Tuberculosis: No; Never smoker; Marital Status - Married; Alcohol Use: Never; Drug Use: No History; Caffeine Use: Moderate; Financial Concerns: No; Food, Clothing or Shelter Needs: No; Support System Lacking: No; Transportation Concerns: No; Advanced Directives: No; Patient does not want information on Advanced Directives; Living Will: No Electronic Signature(s) Signed: 06/23/2017 5:10:34 PM By: Curtis Sites Signed: 06/23/2017 5:48:27 PM By: Baltazar Najjar MD Entered By: Curtis Sites on 06/23/2017 08:10:04 Jesse Wade ([id number]) -------------------------------------------------------------------------------- SuperBill Details Patient Name: Jesse Wade. Date of Service: 06/23/2017 Medical Record Number: [id number] Patient Account Number: [id number] Date of Birth/Sex: 03-10-1966 (52 y.o. Male) Treating RN: Curtis Sites Primary Care Provider: Vonita Moss Other Clinician: Referring Provider: Vonita Moss Treating Provider/Extender: Maxwell Caul Weeks in Treatment: 0 Diagnosis Coding ICD-10 Codes Code Description [phone] Chronic venous hypertension (idiopathic) with ulcer and inflammation of bilateral lower extremity L97.221 Non-pressure chronic ulcer of left calf limited to breakdown of skin L97.211 Non-pressure chronic ulcer of right calf limited to breakdown of skin Facility Procedures CPT4 Code: 41324401 Description: 99213 - WOUND CARE VISIT-LEV 3 EST PT Modifier: Quantity: 1 CPT4 Code: [id number] Description: (Facility Use Only) 29581LT - APPLY MULTLAY COMPRS LWR LT LEG Modifier: Quantity: 1 Physician Procedures CPT4: Description Modifier Quantity Code [id number] 99214 - WC PHYS LEVEL 4 - EST PT 1 ICD-10 Diagnosis Description I87.333 Chronic venous hypertension (idiopathic)  with ulcer and inflammation of bilateral lower extremity L97.221 Non-pressure chronic ulcer  of left calf limited to breakdown of skin L97.211 Non-pressure chronic ulcer of right calf  limited to breakdown of skin Electronic Signature(s) Signed: 06/23/2017 5:48:27 PM By: Baltazar Najjar MD Previous Signature: 06/23/2017 9:09:31 AM Version By: Curtis Sites Entered By: Baltazar Najjar on 06/23/2017 09:30:36

## 2017-06-24 NOTE — Progress Notes (Signed)
Jesse MoodyIKEN, Victoria D. ([id number]) Visit Report for 06/23/2017 Abuse/Suicide Risk Screen Details Patient Name: Jesse MoodyIKEN, Jesse D. Date of Service: 06/23/2017 8:00 AM Medical Record Number: [id number] Patient Account Number: [id number] Date of Birth/Sex: [birth year] [age] y.o. Male) Treating RN: Curtis Sitesorthy, Joanna Primary Care Jolyne Laye: Vonita MossRISSMAN, MARK Other Clinician: Referring Raysha Tilmon: Vonita MossRISSMAN, MARK Treating Charan Prieto/Extender: Maxwell CaulOBSON, MICHAEL G Weeks in Treatment: 0 Abuse/Suicide Risk Screen Items Answer ABUSE/SUICIDE RISK SCREEN: Has anyone close to you tried to hurt or harm you recentlyo No Do you feel uncomfortable with anyone in your familyo No Has anyone forced you do things that you didnot want to doo No Do you have any thoughts of harming yourselfo No Patient displays signs or symptoms of abuse and/or neglect. No Electronic Signature(s) Signed: 06/23/2017 5:10:34 PM By: Curtis Sitesorthy, Joanna Entered By: Curtis Sitesorthy, Joanna on 06/23/2017 08:10:13 Jesse Wade, Jesse D. ([id number]) -------------------------------------------------------------------------------- Activities of Daily Living Details Patient Name: Jesse MoodyIKEN, Jesse D. Date of Service: 06/23/2017 8:00 AM Medical Record Number: [id number] Patient Account Number: [id number] Date of Birth/Sex: [birth year] [age] y.o. Male) Treating RN: Curtis Sitesorthy, Joanna Primary Care Aishwarya Shiplett: Vonita MossRISSMAN, MARK Other Clinician: Referring Marshall Kampf: Vonita MossRISSMAN, MARK Treating Taneka Espiritu/Extender: Maxwell CaulOBSON, MICHAEL G Weeks in Treatment: 0 Activities of Daily Living Items Answer Activities of Daily Living (Please select one for each item) Drive Automobile Completely Able Take Medications Completely Able Use Telephone Completely Able Care for Appearance Completely Able Use Toilet Completely Able Bath / Shower Completely Able Dress Self Completely Able Feed Self Completely Able Walk Completely Able Get In / Out Bed Completely Able Housework Completely Able Prepare Meals Completely Able Handle  Money Completely Able Shop for Self Completely Able Electronic Signature(s) Signed: 06/23/2017 5:10:34 PM By: Curtis Sitesorthy, Joanna Entered By: Curtis Sitesorthy, Joanna on 06/23/2017 08:10:30 Jesse Wade, Jesse D. ([id number]) -------------------------------------------------------------------------------- Education Assessment Details Patient Name: Jesse Wade, Jesse D. Date of Service: 06/23/2017 8:00 AM Medical Record Number: [id number] Patient Account Number: [id number] Date of Birth/Sex: [birth year] [age] y.o. Male) Treating RN: Curtis Sitesorthy, Joanna Primary Care Josten Warmuth: Vonita MossRISSMAN, MARK Other Clinician: Referring Tyri Elmore: Vonita MossRISSMAN, MARK Treating Alece Koppel/Extender: Altamese CarolinaOBSON, MICHAEL G Weeks in Treatment: 0 Primary Learner Assessed: Patient Learning Preferences/Education Level/Primary Language Learning Preference: Explanation, Demonstration Highest Education Level: College or Above Preferred Language: English Cognitive Barrier Assessment/Beliefs Language Barrier: No Translator Needed: No Memory Deficit: No Emotional Barrier: No Cultural/Religious Beliefs Affecting Medical Care: No Physical Barrier Assessment Impaired Vision: No Impaired Hearing: No Decreased Hand dexterity: No Knowledge/Comprehension Assessment Knowledge Level: Medium Comprehension Level: Medium Ability to understand written Medium instructions: Ability to understand verbal Medium instructions: Motivation Assessment Anxiety Level: Calm Cooperation: Cooperative Education Importance: Acknowledges Need Interest in Health Problems: Asks Questions Perception: Coherent Willingness to Engage in Self- Medium Management Activities: Readiness to Engage in Self- Medium Management Activities: Electronic Signature(s) Signed: 06/23/2017 5:10:34 PM By: Curtis Sitesorthy, Joanna Entered By: Curtis Sitesorthy, Joanna on 06/23/2017 08:11:27 Jesse Wade, Jesse D. ([id number]) -------------------------------------------------------------------------------- Fall Risk Assessment  Details Patient Name: Jesse Wade, Jesse D. Date of Service: 06/23/2017 8:00 AM Medical Record Number: [id number] Patient Account Number: [id number] Date of Birth/Sex: [birth year] [age] y.o. Male) Treating RN: Curtis Sitesorthy, Joanna Primary Care Jaydeen Darley: Vonita MossRISSMAN, MARK Other Clinician: Referring Tatum Corl: Vonita MossRISSMAN, MARK Treating Lakelynn Severtson/Extender: Altamese CarolinaOBSON, MICHAEL G Weeks in Treatment: 0 Fall Risk Assessment Items Have you had 2 or more falls in the last 12 monthso 0 No Have you had any fall that resulted in injury in the last 12 monthso 0 No FALL RISK ASSESSMENT: History of falling - immediate or within 3 months 0 No Secondary diagnosis 0 No Ambulatory aid None/bed rest/wheelchair/nurse 0 Yes Crutches/cane/walker 0 No Furniture  0 No IV Access/Saline Lock 0 No Gait/Training Normal/bed rest/immobile 0 Yes Weak 0 No Impaired 0 No Mental Status Oriented to own ability 0 Yes Electronic Signature(s) Signed: 06/23/2017 5:10:34 PM By: Curtis Sites Entered By: Curtis Sites on 06/23/2017 08:11:35 Jesse Wade ([id number]) -------------------------------------------------------------------------------- Nutrition Risk Assessment Details Patient Name: Jesse Wade. Date of Service: 06/23/2017 8:00 AM Medical Record Number: [id number] Patient Account Number: [id number] Date of Birth/Sex: 19-May-1966 (52 y.o. Male) Treating RN: Curtis Sites Primary Care Izaih Kataoka: Vonita Moss Other Clinician: Referring Alexza Norbeck: Vonita Moss Treating Lorilei Horan/Extender: Maxwell Caul Weeks in Treatment: 0 Height (in): 78 Weight (lbs): 332 Body Mass Index (BMI): 38.4 Nutrition Risk Assessment Items NUTRITION RISK SCREEN: I have an illness or condition that made me change the kind and/or amount of 0 No food I eat I eat fewer than two meals per day 0 No I eat few fruits and vegetables, or milk products 0 No I have three or more drinks of beer, liquor or wine almost every day 0 No I have tooth or mouth  problems that make it hard for me to eat 0 No I don't always have enough money to buy the food I need 0 No I eat alone most of the time 0 No I take three or more different prescribed or over-the-counter drugs a day 1 Yes Without wanting to, I have lost or gained 10 pounds in the last six months 0 No I am not always physically able to shop, cook and/or feed myself 0 No Nutrition Protocols Good Risk Protocol 0 No interventions needed Moderate Risk Protocol Electronic Signature(s) Signed: 06/23/2017 5:10:34 PM By: Curtis Sites Entered By: Curtis Sites on 06/23/2017 08:11:41

## 2017-06-24 NOTE — Progress Notes (Signed)
JOESEPH, VERVILLE ([id number]) Visit Report for 06/23/2017 Allergy List Details Patient Name: Jesse Wade, LOE. Date of Service: 06/23/2017 8:00 AM Medical Record Number: [id number] Patient Account Number: [id number] Date of Birth/Sex: 12-23-1965 (52 y.o. Male) Treating RN: Curtis Sites Primary Care Dorene Bruni: Vonita Moss Other Clinician: Referring Jye Fariss: Vonita Moss Treating Marykay Mccleod/Extender: Maxwell Caul Weeks in Treatment: 0 Allergies Active Allergies Seasonal allergies penicillin Allergy Notes Electronic Signature(s) Signed: 06/23/2017 5:10:34 PM By: Curtis Sites Entered By: Curtis Sites on 06/23/2017 08:07:15 Jesse Wade ([id number]) -------------------------------------------------------------------------------- Arrival Information Details Patient Name: Jesse Wade. Date of Service: 06/23/2017 8:00 AM Medical Record Number: [id number] Patient Account Number: [id number] Date of Birth/Sex: January 05, 1966 (52 y.o. Male) Treating RN: Curtis Sites Primary Care Markiesha Delia: Vonita Moss Other Clinician: Referring Alease Fait: Vonita Moss Treating Jebediah Macrae/Extender: Altamese [place] in Treatment: 0 Visit Information Patient Arrived: Ambulatory Arrival Time: 08:06 Accompanied By: self Transfer Assistance: None Patient Identification Verified: Yes Secondary Verification Process Yes Completed: Patient Has Alerts: Yes Patient Alerts: ABI 09/2016 L 1.2 R 1.28 History Since Last Visit Added or deleted any medications: No Any new allergies or adverse reactions: No Had a fall or experienced change in activities of daily living that may affect risk of falls: No Signs or symptoms of abuse/neglect since last visito No Hospitalized since last visit: No Has Dressing in Place as Prescribed: Yes Has Compression in Place as Prescribed: Yes Electronic Signature(s) Signed: 06/23/2017 5:10:34 PM By: Curtis Sites Entered By: Curtis Sites on 06/23/2017 08:14:42 Jesse Wade ([id number]) -------------------------------------------------------------------------------- Clinic Level of Care Assessment Details Patient Name: Jesse Wade. Date of Service: 06/23/2017 8:00 AM Medical Record Number: [id number] Patient Account Number: [id number] Date of Birth/Sex: 1965/06/28 (52 y.o. Male) Treating RN: Curtis Sites Primary Care Thorne Wirz: Vonita Moss Other Clinician: Referring Laparis Durrett: Vonita Moss Treating Sharmila Wrobleski/Extender: Altamese [place] in Treatment: 0 Clinic Level of Care Assessment Items TOOL 1 Quantity Score []  - Use when EandM and Procedure is performed on INITIAL visit 0 ASSESSMENTS - Nursing Assessment / Reassessment X - General Physical Exam (combine w/ comprehensive assessment (listed just below) when 1 20 performed on new pt. evals) X- 1 25 Comprehensive Assessment (HX, ROS, Risk Assessments, Wounds Hx, etc.) ASSESSMENTS - Wound and Skin Assessment / Reassessment []  - Dermatologic / Skin Assessment (not related to wound area) 0 ASSESSMENTS - Ostomy and/or Continence Assessment and Care []  - Incontinence Assessment and Management 0 []  - 0 Ostomy Care Assessment and Management (repouching, etc.) PROCESS - Coordination of Care X - Simple Patient / Family Education for ongoing care 1 15 []  - 0 Complex (extensive) Patient / Family Education for ongoing care X- 1 10 Staff obtains Chiropractor, Records, Test Results / Process Orders []  - 0 Staff telephones HHA, Nursing Homes / Clarify orders / etc []  - 0 Routine Transfer to another Facility (non-emergent condition) []  - 0 Routine Hospital Admission (non-emergent condition) X- 1 15 New Admissions / Manufacturing engineer / Ordering NPWT, Apligraf, etc. []  - 0 Emergency Hospital Admission (emergent condition) PROCESS - Special Needs []  - Pediatric / Minor Patient Management 0 []  - 0 Isolation Patient Management []  - 0 Hearing / Language / Visual special needs []  - 0 Assessment  of Community assistance (transportation, D/C planning, etc.) []  - 0 Additional assistance / Altered mentation []  - 0 Support Surface(s) Assessment (bed, cushion, seat, etc.) TRICE, ASPINALL ([id number]) INTERVENTIONS - Miscellaneous []  - External ear exam 0 []  - 0 Patient Transfer (multiple staff / Nurse, adult /  Similar devices) []  - 0 Simple Staple / Suture removal (25 or less) []  - 0 Complex Staple / Suture removal (26 or more) []  - 0 Hypo/Hyperglycemic Management (do not check if billed separately) []  - 0 Ankle / Brachial Index (ABI) - do not check if billed separately Has the patient been seen at the hospital within the last three years: Yes Total Score: 85 Level Of Care: New/Established - Level 3 Electronic Signature(s) Signed: 06/23/2017 5:10:34 PM By: Curtis Sitesorthy, Joanna Entered By: Curtis Sitesorthy, Joanna on 06/23/2017 09:09:22 Jesse Wade, Jesse D. ([id number]) -------------------------------------------------------------------------------- Compression Therapy Details Patient Name: Jesse Wade, Jesse D. Date of Service: 06/23/2017 8:00 AM Medical Record Number: [id number] Patient Account Number: [id number] Date of Birth/Sex: 17-Apr-1966 [age] y.o. Male) Treating RN: Curtis Sitesorthy, Joanna Primary Care Gabreal Worton: Vonita MossRISSMAN, MARK Other Clinician: Referring Dorcas Melito: Vonita MossRISSMAN, MARK Treating Roseann Kees/Extender: Maxwell CaulOBSON, MICHAEL G Weeks in Treatment: 0 Compression Therapy Performed for Wound Assessment: Wound #5 Left,Medial Lower Leg Performed By: Clinician Curtis Sitesorthy, Joanna, RN Compression Type: Three Layer Pre Treatment ABI: 1.2 Post Procedure Diagnosis Same as Pre-procedure Electronic Signature(s) Signed: 06/23/2017 9:09:06 AM By: Curtis Sitesorthy, Joanna Entered By: Curtis Sitesorthy, Joanna on 06/23/2017 09:09:06 Jesse Wade, Jesse D. ([id number]) -------------------------------------------------------------------------------- Encounter Discharge Information Details Patient Name: Jesse Wade, Jesse D. Date of Service: 06/23/2017 8:00 AM Medical Record  Number: [id number] Patient Account Number: [id number] Date of Birth/Sex: 17-Apr-1966 [age] y.o. Male) Treating RN: Curtis Sitesorthy, Joanna Primary Care Mayerli Kirst: Vonita MossRISSMAN, MARK Other Clinician: Referring Rontavious Albright: Vonita MossRISSMAN, MARK Treating Loura Pitt/Extender: Altamese CarolinaOBSON, MICHAEL G Weeks in Treatment: 0 Encounter Discharge Information Items Discharge Pain Level: 0 Discharge Condition: Stable Ambulatory Status: Ambulatory Discharge Destination: Home Transportation: Private Auto Accompanied By: self Schedule Follow-up Appointment: Yes Medication Reconciliation completed and No provided to Patient/Care Brinae Woods: Provided on Clinical Summary of Care: 06/23/2017 Form Type Recipient Paper Patient JA Electronic Signature(s) Signed: 06/23/2017 9:10:38 AM By: Curtis Sitesorthy, Joanna Entered By: Curtis Sitesorthy, Joanna on 06/23/2017 09:10:38 Jesse Wade, Jesse D. ([id number]) -------------------------------------------------------------------------------- Lower Extremity Assessment Details Patient Name: Jesse Wade, Jesse D. Date of Service: 06/23/2017 8:00 AM Medical Record Number: [id number] Patient Account Number: [id number] Date of Birth/Sex: 17-Apr-1966 [age] y.o. Male) Treating RN: Curtis Sitesorthy, Joanna Primary Care Hiran Leard: Vonita MossRISSMAN, MARK Other Clinician: Referring Careena Degraffenreid: Vonita MossRISSMAN, MARK Treating Tequia Wolman/Extender: Maxwell CaulOBSON, MICHAEL G Weeks in Treatment: 0 Edema Assessment Assessed: [Left: No] [Right: No] Edema: [Left: No] [Right: No] Calf Left: Right: Point of Measurement: 42 cm From Medial Instep 44.7 cm 43.2 cm Ankle Left: Right: Point of Measurement: 13 cm From Medial Instep 27.1 cm 26.3 cm Vascular Assessment Pulses: Dorsalis Pedis Palpable: [Left:Yes] [Right:Yes] Doppler Audible: [Left:Yes] [Right:Yes] Posterior Tibial Palpable: [Left:Yes] [Right:Yes] Doppler Audible: [Left:Yes] [Right:Yes] Extremity colors, hair growth, and conditions: Extremity Color: [Left:Hyperpigmented] [Right:Hyperpigmented] Hair Growth on Extremity:  [Left:Yes] [Right:Yes] Temperature of Extremity: [Left:Warm] [Right:Warm] Capillary Refill: [Left:< 3 seconds] [Right:< 3 seconds] Toe Nail Assessment Left: Right: Thick: No No Discolored: No No Deformed: No No Improper Length and Hygiene: No No Electronic Signature(s) Signed: 06/23/2017 5:10:34 PM By: Curtis Sitesorthy, Joanna Entered By: Curtis Sitesorthy, Joanna on 06/23/2017 08:26:58 Jesse Wade, Jesse D. ([id number]) -------------------------------------------------------------------------------- Multi Wound Chart Details Patient Name: Jesse Wade, Jesse D. Date of Service: 06/23/2017 8:00 AM Medical Record Number: [id number] Patient Account Number: [id number] Date of Birth/Sex: 17-Apr-1966 [age] y.o. Male) Treating RN: Curtis Sitesorthy, Joanna Primary Care Lilyona Richner: Vonita MossRISSMAN, MARK Other Clinician: Referring Jamonta Goerner: Vonita MossRISSMAN, MARK Treating Kiante Petrovich/Extender: Maxwell CaulOBSON, MICHAEL G Weeks in Treatment: 0 Vital Signs Height(in): 77 Pulse(bpm): 62 Weight(lbs): 305 Blood Pressure(mmHg): 141/81 Body Mass Index(BMI): 36 Temperature(F): 97.9 Respiratory Rate 18 (breaths/min): Photos: [N/A:N/A] Wound Location: Right Lower Leg - Medial Left Lower Leg -  Medial N/A Wounding Event: Gradually Appeared Gradually Appeared N/A Primary Etiology: Venous Leg Ulcer Venous Leg Ulcer N/A Comorbid History: Sleep Apnea, Hypertension Sleep Apnea, Hypertension N/A Date Acquired: 04/26/2017 04/26/2017 N/A Weeks of Treatment: 0 0 N/A Wound Status: Open Open N/A Measurements L x W x D 1.2x1x0.1 9.2x11.3x0.1 N/A (cm) Area (cm) : 0.942 81.65 N/A Volume (cm) : 0.094 8.165 N/A Classification: Full Thickness Without Full Thickness Without N/A Exposed Support Structures Exposed Support Structures Exudate Amount: Medium Medium N/A Exudate Type: Serous Serous N/A Exudate Color: amber amber N/A Wound Margin: Flat and Intact Flat and Intact N/A Granulation Amount: Large (67-100%) Large (67-100%) N/A Granulation Quality: Pink Pink N/A Necrotic Amount:  None Present (0%) Small (1-33%) N/A Necrotic Tissue: N/A Eschar N/A Exposed Structures: Fascia: No Fascia: No N/A Fat Layer (Subcutaneous Fat Layer (Subcutaneous Tissue) Exposed: No Tissue) Exposed: No Tendon: No Tendon: No Muscle: No Muscle: No Joint: No Joint: No Bone: No Bone: No Epithelialization: Medium (34-66%) Medium (34-66%) N/A EULALIO, REAMY ([id number]) Periwound Skin Texture: Excoriation: No Excoriation: No N/A Induration: No Induration: No Callus: No Callus: No Crepitus: No Crepitus: No Rash: No Rash: No Scarring: No Scarring: No Periwound Skin Moisture: Maceration: No Maceration: No N/A Dry/Scaly: No Dry/Scaly: No Periwound Skin Color: Hemosiderin Staining: Yes Hemosiderin Staining: Yes N/A Atrophie Blanche: No Atrophie Blanche: No Cyanosis: No Cyanosis: No Ecchymosis: No Ecchymosis: No Erythema: No Erythema: No Mottled: No Mottled: No Pallor: No Pallor: No Rubor: No Rubor: No Temperature: No Abnormality No Abnormality N/A Tenderness on Palpation: No No N/A Wound Preparation: Ulcer Cleansing: Ulcer Cleansing: N/A Rinsed/Irrigated with Saline Rinsed/Irrigated with Saline Topical Anesthetic Applied: Topical Anesthetic Applied: None Other: lidocaine 4% Procedures Performed: N/A Compression Therapy N/A Treatment Notes Wound #4 (Right, Medial Lower Leg) 1. Cleansed with: Clean wound with Normal Saline 4. Dressing Applied: Other dressing (specify in notes) 5. Secondary Dressing Applied Bordered Foam Dressing Notes silvercel Wound #5 (Left, Medial Lower Leg) 1. Cleansed with: Clean wound with Normal Saline 3. Peri-wound Care: Other peri-wound care (specify in notes) 4. Dressing Applied: Other dressing (specify in notes) 5. Secondary Dressing Applied ABD Pad 7. Secured with 3 Layer Compression System - Left Lower Extremity Notes TCA, silvercel Electronic Signature(s) Signed: 06/23/2017 5:48:27 PM By: Baltazar Najjar MD Previous  Signature: 06/23/2017 8:32:16 AM Version By: Caryn Section ([id number]) Entered By: Baltazar Najjar on 06/23/2017 09:21:39 Jesse Wade ([id number]) -------------------------------------------------------------------------------- Multi-Disciplinary Care Plan Details Patient Name: WOODROE, VOGAN. Date of Service: 06/23/2017 8:00 AM Medical Record Number: [id number] Patient Account Number: [id number] Date of Birth/Sex: [birth year] (52 y.o. Male) Treating RN: Curtis Sites Primary Care Brayah Urquilla: Vonita Moss Other Clinician: Referring Lakasha Mcfall: Vonita Moss Treating Seiji Wiswell/Extender: Altamese [place] in Treatment: 0 Active Inactive ` Orientation to the Wound Care Program Nursing Diagnoses: Knowledge deficit related to the wound healing center program Goals: Patient/caregiver will verbalize understanding of the Wound Healing Center Program Date Initiated: 06/23/2017 Target Resolution Date: 09/25/2017 Goal Status: Active Interventions: Provide education on orientation to the wound center Notes: ` Venous Leg Ulcer Nursing Diagnoses: Actual venous Insuffiency (use after diagnosis is confirmed) Goals: Patient will maintain optimal edema control Date Initiated: 06/23/2017 Target Resolution Date: 09/25/2017 Goal Status: Active Interventions: Assess peripheral edema status every visit. Compression as ordered Notes: ` Wound/Skin Impairment Nursing Diagnoses: Impaired tissue integrity Goals: Ulcer/skin breakdown will heal within 14 weeks Date Initiated: 06/23/2017 Target Resolution Date: 09/25/2017 Goal Status: Active NIVEK, POWLEY ([id number]) Interventions: Assess patient/caregiver ability to obtain necessary supplies  Assess patient/caregiver ability to perform ulcer/skin care regimen upon admission and as needed Assess ulceration(s) every visit Notes: Electronic Signature(s) Signed: 06/23/2017 8:32:03 AM By: Curtis Sites Entered By: Curtis Sites on  06/23/2017 08:32:02 Jesse Wade ([id number]) -------------------------------------------------------------------------------- Pain Assessment Details Patient Name: Jesse Wade. Date of Service: 06/23/2017 8:00 AM Medical Record Number: [id number] Patient Account Number: [id number] Date of Birth/Sex: 25-Jul-1965 (52 y.o. Male) Treating RN: Curtis Sites Primary Care Balin Vandegrift: Vonita Moss Other Clinician: Referring Vladimir Lenhoff: Vonita Moss Treating Keyly Baldonado/Extender: Maxwell Caul Weeks in Treatment: 0 Active Problems Location of Pain Severity and Description of Pain Patient Has Paino No Site Locations With Dressing Change: Yes Duration of the Pain. Constant / Intermittento Intermittent Pain Management and Medication Current Pain Management: Notes Topical or injectable lidocaine is offered to patient for acute pain when surgical debridement is performed. If needed, Patient is instructed to use over the counter pain medication for the following 24-48 hours after debridement. Wound care MDs do not prescribed pain medications. Patient has chronic pain or uncontrolled pain. Patient has been instructed to make an appointment with their Primary Care Physician for pain management. Electronic Signature(s) Signed: 06/23/2017 5:10:34 PM By: Curtis Sites Entered By: Curtis Sites on 06/23/2017 08:06:57 Jesse Wade ([id number]) -------------------------------------------------------------------------------- Patient/Caregiver Education Details Patient Name: Jesse Wade. Date of Service: 06/23/2017 8:00 AM Medical Record Number: [id number] Patient Account Number: [id number] Date of Birth/Gender: 04-23-1966 (52 y.o. Male) Treating RN: Curtis Sites Primary Care Physician: Vonita Moss Other Clinician: Referring Physician: Vonita Moss Treating Physician/Extender: Altamese [place] in Treatment: 0 Education Assessment Education Provided To: Patient Education Topics  Provided Venous: Handouts: Other: leg elevation Methods: Explain/Verbal Responses: State content correctly Wound/Skin Impairment: Handouts: Other: wound care as ordered Methods: Demonstration, Explain/Verbal Responses: State content correctly Electronic Signature(s) Signed: 06/23/2017 5:10:34 PM By: Curtis Sites Entered By: Curtis Sites on 06/23/2017 09:11:06 Jesse Wade ([id number]) -------------------------------------------------------------------------------- Wound Assessment Details Patient Name: Jesse Wade. Date of Service: 06/23/2017 8:00 AM Medical Record Number: [id number] Patient Account Number: [id number] Date of Birth/Sex: July 10, 1965 (52 y.o. Male) Treating RN: Curtis Sites Primary Care Juliann Olesky: Vonita Moss Other Clinician: Referring Patton Rabinovich: Vonita Moss Treating Mosella Kasa/Extender: Maxwell Caul Weeks in Treatment: 0 Wound Status Wound Number: 4 Primary Etiology: Venous Leg Ulcer Wound Location: Right Lower Leg - Medial Wound Status: Open Wounding Event: Gradually Appeared Comorbid History: Sleep Apnea, Hypertension Date Acquired: 04/26/2017 Weeks Of Treatment: 0 Clustered Wound: No Photos Photo Uploaded By: Curtis Sites on 06/23/2017 08:32:49 Wound Measurements Length: (cm) 1.2 Width: (cm) 1 Depth: (cm) 0.1 Area: (cm) 0.942 Volume: (cm) 0.094 % Reduction in Area: % Reduction in Volume: Epithelialization: Medium (34-66%) Tunneling: No Undermining: No Wound Description Full Thickness Without Exposed Support Classification: Structures Wound Margin: Flat and Intact Exudate Medium Amount: Exudate Type: Serous Exudate Color: amber Foul Odor After Cleansing: No Slough/Fibrino No Wound Bed Granulation Amount: Large (67-100%) Exposed Structure Granulation Quality: Pink Fascia Exposed: No Necrotic Amount: None Present (0%) Fat Layer (Subcutaneous Tissue) Exposed: No Tendon Exposed: No Muscle Exposed: No Joint Exposed:  No Bone Exposed: No ESAU, FRIDMAN ([id number]) Periwound Skin Texture Texture Color No Abnormalities Noted: No No Abnormalities Noted: No Callus: No Atrophie Blanche: No Crepitus: No Cyanosis: No Excoriation: No Ecchymosis: No Induration: No Erythema: No Rash: No Hemosiderin Staining: Yes Scarring: No Mottled: No Pallor: No Moisture Rubor: No No Abnormalities Noted: No Dry / Scaly: No Temperature / Pain Maceration: No Temperature: No Abnormality Wound Preparation Ulcer Cleansing: Rinsed/Irrigated with Saline Topical Anesthetic  Applied: None Treatment Notes Wound #4 (Right, Medial Lower Leg) 1. Cleansed with: Clean wound with Normal Saline 4. Dressing Applied: Other dressing (specify in notes) 5. Secondary Dressing Applied Bordered Foam Dressing Notes silvercel Electronic Signature(s) Signed: 06/23/2017 5:10:34 PM By: Curtis Sites Entered By: Curtis Sites on 06/23/2017 08:21:45 Jesse Wade ([id number]) -------------------------------------------------------------------------------- Wound Assessment Details Patient Name: Jesse Wade. Date of Service: 06/23/2017 8:00 AM Medical Record Number: [id number] Patient Account Number: [id number] Date of Birth/Sex: 13-Jun-1966 (52 y.o. Male) Treating RN: Curtis Sites Primary Care Jerardo Costabile: Vonita Moss Other Clinician: Referring Amillion Macchia: Vonita Moss Treating Malyah Ohlrich/Extender: Maxwell Caul Weeks in Treatment: 0 Wound Status Wound Number: 5 Primary Etiology: Venous Leg Ulcer Wound Location: Left Lower Leg - Medial Wound Status: Open Wounding Event: Gradually Appeared Comorbid History: Sleep Apnea, Hypertension Date Acquired: 04/26/2017 Weeks Of Treatment: 0 Clustered Wound: No Photos Photo Uploaded By: Curtis Sites on 06/23/2017 08:32:49 Wound Measurements Length: (cm) 9.2 Width: (cm) 11.3 Depth: (cm) 0.1 Area: (cm) 81.65 Volume: (cm) 8.165 % Reduction in Area: % Reduction in  Volume: Epithelialization: Medium (34-66%) Tunneling: No Undermining: No Wound Description Full Thickness Without Exposed Support Classification: Structures Wound Margin: Flat and Intact Exudate Medium Amount: Exudate Type: Serous Exudate Color: amber Foul Odor After Cleansing: No Slough/Fibrino No Wound Bed Granulation Amount: Large (67-100%) Exposed Structure Granulation Quality: Pink Fascia Exposed: No Necrotic Amount: Small (1-33%) Fat Layer (Subcutaneous Tissue) Exposed: No Necrotic Quality: Eschar Tendon Exposed: No Muscle Exposed: No Joint Exposed: No Bone Exposed: No JANSSEN, ZEE ([id number]) Periwound Skin Texture Texture Color No Abnormalities Noted: No No Abnormalities Noted: No Callus: No Atrophie Blanche: No Crepitus: No Cyanosis: No Excoriation: No Ecchymosis: No Induration: No Erythema: No Rash: No Hemosiderin Staining: Yes Scarring: No Mottled: No Pallor: No Moisture Rubor: No No Abnormalities Noted: No Dry / Scaly: No Temperature / Pain Maceration: No Temperature: No Abnormality Wound Preparation Ulcer Cleansing: Rinsed/Irrigated with Saline Topical Anesthetic Applied: Other: lidocaine 4%, Treatment Notes Wound #5 (Left, Medial Lower Leg) 1. Cleansed with: Clean wound with Normal Saline 3. Peri-wound Care: Other peri-wound care (specify in notes) 4. Dressing Applied: Other dressing (specify in notes) 5. Secondary Dressing Applied ABD Pad 7. Secured with 3 Layer Compression System - Left Lower Extremity Notes TCA, silvercel Electronic Signature(s) Signed: 06/23/2017 5:10:34 PM By: Curtis Sites Entered By: Curtis Sites on 06/23/2017 08:23:07 Jesse Wade ([id number]) -------------------------------------------------------------------------------- Vitals Details Patient Name: Jesse Wade. Date of Service: 06/23/2017 8:00 AM Medical Record Number: [id number] Patient Account Number: [id number] Date of Birth/Sex: 20-Aug-1965  (52 y.o. Male) Treating RN: Curtis Sites Primary Care Raghav Verrilli: Vonita Moss Other Clinician: Referring Tarini Carrier: Vonita Moss Treating Orelia Brandstetter/Extender: Altamese [place] in Treatment: 0 Vital Signs Time Taken: 08:12 Temperature (F): 97.9 Height (in): 77 Pulse (bpm): 62 Source: Measured Respiratory Rate (breaths/min): 18 Weight (lbs): 305 Blood Pressure (mmHg): 141/81 Source: Measured Reference Range: 80 - 120 mg / dl Body Mass Index (BMI): 36.2 Electronic Signature(s) Signed: 06/23/2017 5:10:34 PM By: Curtis Sites Entered By: Curtis Sites on 06/23/2017 08:12:50

## 2017-06-30 ENCOUNTER — Encounter: Payer: BLUE CROSS/BLUE SHIELD | Admitting: Internal Medicine

## 2017-06-30 DIAGNOSIS — S81802A Unspecified open wound, left lower leg, initial encounter: Secondary | ICD-10-CM | POA: Diagnosis not present

## 2017-06-30 DIAGNOSIS — S81801A Unspecified open wound, right lower leg, initial encounter: Secondary | ICD-10-CM | POA: Diagnosis not present

## 2017-06-30 DIAGNOSIS — I87333 Chronic venous hypertension (idiopathic) with ulcer and inflammation of bilateral lower extremity: Secondary | ICD-10-CM | POA: Diagnosis not present

## 2017-06-30 DIAGNOSIS — L97211 Non-pressure chronic ulcer of right calf limited to breakdown of skin: Secondary | ICD-10-CM | POA: Diagnosis not present

## 2017-06-30 DIAGNOSIS — L97221 Non-pressure chronic ulcer of left calf limited to breakdown of skin: Secondary | ICD-10-CM | POA: Diagnosis not present

## 2017-06-30 DIAGNOSIS — I10 Essential (primary) hypertension: Secondary | ICD-10-CM | POA: Diagnosis not present

## 2017-06-30 DIAGNOSIS — G4730 Sleep apnea, unspecified: Secondary | ICD-10-CM | POA: Diagnosis not present

## 2017-07-01 NOTE — Progress Notes (Signed)
STANLEY, LYNESS ([id number]) Visit Report for 06/30/2017 Arrival Information Details Patient Name: Jesse Wade, Jesse Wade. Date of Service: 06/30/2017 3:15 PM Medical Record Number: [id number] Patient Account Number: [id number] Date of Birth/Sex: [birth year] (52 y.o. Male) Treating RN: Curtis Sites Primary Care Deloyce Walthers: Vonita Moss Other Clinician: Referring Deaja Rizo: Vonita Moss Treating Katharyn Schauer/Extender: Altamese Vincent in Treatment: 1 Visit Information History Since Last Visit Added or deleted any medications: No Patient Arrived: Ambulatory Any new allergies or adverse reactions: No Arrival Time: 15:27 Had a fall or experienced change in No Accompanied By: self activities of daily living that may affect Transfer Assistance: None risk of falls: Patient Identification Verified: Yes Signs or symptoms of abuse/neglect since last visito No Secondary Verification Process Yes Hospitalized since last visit: No Completed: Has Dressing in Place as Prescribed: Yes Patient Has Alerts: Yes Has Compression in Place as Prescribed: Yes Patient Alerts: ABI 09/2016 L 1.2 R Pain Present Now: No 1.28 Electronic Signature(s) Signed: 06/30/2017 4:52:33 PM By: Curtis Sites Entered By: Curtis Sites on 06/30/2017 15:27:24 Jesse Wade ([id number]) -------------------------------------------------------------------------------- Clinic Level of Care Assessment Details Patient Name: Jesse Wade. Date of Service: 06/30/2017 3:15 PM Medical Record Number: [id number] Patient Account Number: [id number] Date of Birth/Sex: 04-08-1966 (52 y.o. Male) Treating RN: Curtis Sites Primary Care Jahmil Macleod: Vonita Moss Other Clinician: Referring Stevenson Windmiller: Vonita Moss Treating Loretha Ure/Extender: Altamese  in Treatment: 1 Clinic Level of Care Assessment Items TOOL 4 Quantity Score []  - Use when only an EandM is performed on FOLLOW-UP visit 0 ASSESSMENTS - Nursing Assessment /  Reassessment X - Reassessment of Co-morbidities (includes updates in patient status) 1 10 X- 1 5 Reassessment of Adherence to Treatment Plan ASSESSMENTS - Wound and Skin Assessment / Reassessment []  - Simple Wound Assessment / Reassessment - one wound 0 X- 2 5 Complex Wound Assessment / Reassessment - multiple wounds []  - 0 Dermatologic / Skin Assessment (not related to wound area) ASSESSMENTS - Focused Assessment X - Circumferential Edema Measurements - multi extremities 1 5 []  - 0 Nutritional Assessment / Counseling / Intervention X- 1 5 Lower Extremity Assessment (monofilament, tuning fork, pulses) []  - 0 Peripheral Arterial Disease Assessment (using hand held doppler) ASSESSMENTS - Ostomy and/or Continence Assessment and Care []  - Incontinence Assessment and Management 0 []  - 0 Ostomy Care Assessment and Management (repouching, etc.) PROCESS - Coordination of Care X - Simple Patient / Family Education for ongoing care 1 15 []  - 0 Complex (extensive) Patient / Family Education for ongoing care []  - 0 Staff obtains Chiropractor, Records, Test Results / Process Orders []  - 0 Staff telephones HHA, Nursing Homes / Clarify orders / etc []  - 0 Routine Transfer to another Facility (non-emergent condition) []  - 0 Routine Hospital Admission (non-emergent condition) []  - 0 New Admissions / Manufacturing engineer / Ordering NPWT, Apligraf, etc. []  - 0 Emergency Hospital Admission (emergent condition) X- 1 10 Simple Discharge Coordination Jesse Wade, Jesse Wade ([id number]) []  - 0 Complex (extensive) Discharge Coordination PROCESS - Special Needs []  - Pediatric / Minor Patient Management 0 []  - 0 Isolation Patient Management []  - 0 Hearing / Language / Visual special needs []  - 0 Assessment of Community assistance (transportation, D/C planning, etc.) []  - 0 Additional assistance / Altered mentation []  - 0 Support Surface(s) Assessment (bed, cushion, seat, etc.) INTERVENTIONS -  Wound Cleansing / Measurement []  - Simple Wound Cleansing - one wound 0 X- 2 5 Complex Wound Cleansing - multiple wounds X- 1 5 Wound Imaging (photographs -  any number of wounds) []  - 0 Wound Tracing (instead of photographs) []  - 0 Simple Wound Measurement - one wound X- 2 5 Complex Wound Measurement - multiple wounds INTERVENTIONS - Wound Dressings []  - Small Wound Dressing one or multiple wounds 0 []  - 0 Medium Wound Dressing one or multiple wounds []  - 0 Large Wound Dressing one or multiple wounds []  - 0 Application of Medications - topical []  - 0 Application of Medications - injection INTERVENTIONS - Miscellaneous []  - External ear exam 0 []  - 0 Specimen Collection (cultures, biopsies, blood, body fluids, etc.) []  - 0 Specimen(s) / Culture(s) sent or taken to Lab for analysis []  - 0 Patient Transfer (multiple staff / Nurse, adultHoyer Lift / Similar devices) []  - 0 Simple Staple / Suture removal (25 or less) []  - 0 Complex Staple / Suture removal (26 or more) []  - 0 Hypo / Hyperglycemic Management (close monitor of Blood Glucose) []  - 0 Ankle / Brachial Index (ABI) - do not check if billed separately X- 1 5 Vital Signs Jesse Wade, Jesse D. ([id number]) Has the patient been seen at the hospital within the last three years: Yes Total Score: 90 Level Of Care: New/Established - Level 3 Electronic Signature(s) Signed: 06/30/2017 4:52:33 PM By: Curtis Sitesorthy, Joanna Entered By: Curtis Sitesorthy, Joanna on 06/30/2017 16:06:05 Jesse Wade, Jesse D. ([id number]) -------------------------------------------------------------------------------- Encounter Discharge Information Details Patient Name: Jesse Wade, Jesse D. Date of Service: 06/30/2017 3:15 PM Medical Record Number: [id number] Patient Account Number: [id number] Date of Birth/Sex: 1965-12-27 42(51 y.o. Male) Treating RN: Curtis Sitesorthy, Joanna Primary Care Dutchess Crosland: Vonita MossRISSMAN, MARK Other Clinician: Referring Trannie Bardales: Vonita MossRISSMAN, MARK Treating Tenicia Gural/Extender: Altamese CarolinaOBSON,  MICHAEL G Weeks in Treatment: 1 Encounter Discharge Information Items Discharge Pain Level: 0 Discharge Condition: Stable Ambulatory Status: Ambulatory Discharge Destination: Home Transportation: Private Auto Accompanied By: self Schedule Follow-up Appointment: No Medication Reconciliation completed and No provided to Patient/Care Darnelle Derrick: Provided on Clinical Summary of Care: 06/30/2017 Form Type Recipient Paper Patient JA Electronic Signature(s) Signed: 06/30/2017 4:06:38 PM By: Curtis Sitesorthy, Joanna Entered By: Curtis Sitesorthy, Joanna on 06/30/2017 16:06:37 Jesse Wade, Jesse D. ([id number]) -------------------------------------------------------------------------------- Lower Extremity Assessment Details Patient Name: Jesse Wade, Remington D. Date of Service: 06/30/2017 3:15 PM Medical Record Number: [id number] Patient Account Number: [id number] Date of Birth/Sex: 1965-12-27 2(51 y.o. Male) Treating RN: Curtis Sitesorthy, Joanna Primary Care Lelar Farewell: Vonita MossRISSMAN, MARK Other Clinician: Referring Kimie Pidcock: Vonita MossRISSMAN, MARK Treating Aprile Dickenson/Extender: Maxwell CaulOBSON, MICHAEL G Weeks in Treatment: 1 Edema Assessment Assessed: [Left: No] [Right: No] [Left: Edema] [Right: :] Calf Left: Right: Point of Measurement: 42 cm From Medial Instep 45.6 cm cm Ankle Left: Right: Point of Measurement: 13 cm From Medial Instep 26.3 cm cm Vascular Assessment Pulses: Dorsalis Pedis Palpable: [Left:Yes] Posterior Tibial Extremity colors, hair growth, and conditions: Extremity Color: [Left:Hyperpigmented] Hair Growth on Extremity: [Left:Yes] Temperature of Extremity: [Left:Warm] Capillary Refill: [Left:< 3 seconds] Electronic Signature(s) Signed: 06/30/2017 4:52:33 PM By: Curtis Sitesorthy, Joanna Entered By: Curtis Sitesorthy, Joanna on 06/30/2017 15:43:26 Jesse Wade, Aldred D. ([id number]) -------------------------------------------------------------------------------- Multi Wound Chart Details Patient Name: Jesse Wade, Darrall D. Date of Service: 06/30/2017 3:15 PM Medical  Record Number: [id number] Patient Account Number: [id number] Date of Birth/Sex: 1965-12-27 55(51 y.o. Male) Treating RN: Curtis Sitesorthy, Joanna Primary Care Naydelin Ziegler: Vonita MossRISSMAN, MARK Other Clinician: Referring Annaliese Saez: Vonita MossRISSMAN, MARK Treating Alcides Nutting/Extender: Maxwell CaulOBSON, MICHAEL G Weeks in Treatment: 1 Vital Signs Height(in): 77 Pulse(bpm): 61 Weight(lbs): 305 Blood Pressure(mmHg): 135/72 Body Mass Index(BMI): 36 Temperature(F): 98.3 Respiratory Rate 18 (breaths/min): Photos: [N/A:N/A] Wound Location: Right, Medial Lower Leg Left, Medial Lower Leg N/A Wounding Event: Gradually Appeared Gradually Appeared N/A Primary Etiology: Venous Leg  Ulcer Venous Leg Ulcer N/A Comorbid History: Sleep Apnea, Hypertension Sleep Apnea, Hypertension N/A Date Acquired: 04/26/2017 04/26/2017 N/A Weeks of Treatment: 1 1 N/A Wound Status: Healed - Epithelialized Healed - Epithelialized N/A Measurements L x W x D 0x0x0 0x0x0 N/A (cm) Area (cm) : 0 0 N/A Volume (cm) : 0 0 N/A % Reduction in Area: 100.00% 100.00% N/A % Reduction in Volume: 100.00% 100.00% N/A Classification: Full Thickness Without Full Thickness Without N/A Exposed Support Structures Exposed Support Structures Exudate Amount: Medium Medium N/A Exudate Type: Serous Serous N/A Exudate Color: amber amber N/A Wound Margin: Flat and Intact Flat and Intact N/A Granulation Amount: Large (67-100%) Large (67-100%) N/A Granulation Quality: Pink Pink N/A Necrotic Amount: None Present (0%) Small (1-33%) N/A Necrotic Tissue: N/A Eschar N/A Exposed Structures: Fascia: No Fascia: No N/A Fat Layer (Subcutaneous Fat Layer (Subcutaneous Tissue) Exposed: No Tissue) Exposed: No Tendon: No Tendon: No Muscle: No Muscle: No STARSKY, NANNA ([id number]) Joint: No Joint: No Bone: No Bone: No Epithelialization: Medium (34-66%) Medium (34-66%) N/A Periwound Skin Texture: Excoriation: No Excoriation: No N/A Induration: No Induration: No Callus:  No Callus: No Crepitus: No Crepitus: No Rash: No Rash: No Scarring: No Scarring: No Periwound Skin Moisture: Maceration: No Maceration: No N/A Dry/Scaly: No Dry/Scaly: No Periwound Skin Color: Hemosiderin Staining: Yes Hemosiderin Staining: Yes N/A Atrophie Blanche: No Atrophie Blanche: No Cyanosis: No Cyanosis: No Ecchymosis: No Ecchymosis: No Erythema: No Erythema: No Mottled: No Mottled: No Pallor: No Pallor: No Rubor: No Rubor: No Temperature: No Abnormality No Abnormality N/A Tenderness on Palpation: No No N/A Wound Preparation: Ulcer Cleansing: Ulcer Cleansing: N/A Rinsed/Irrigated with Saline Rinsed/Irrigated with Saline Topical Anesthetic Applied: Topical Anesthetic Applied: None None Treatment Notes Electronic Signature(s) Signed: 06/30/2017 4:32:47 PM By: Baltazar Najjar MD Entered By: Baltazar Najjar on 06/30/2017 16:19:32 Jesse Wade ([id number]) -------------------------------------------------------------------------------- Multi-Disciplinary Care Plan Details Patient Name: Jesse Wade, Jesse Wade. Date of Service: 06/30/2017 3:15 PM Medical Record Number: [id number] Patient Account Number: [id number] Date of Birth/Sex: 06-17-1966 (52 y.o. Male) Treating RN: Curtis Sites Primary Care Briley Bumgarner: Vonita Moss Other Clinician: Referring Kimblery Diop: Vonita Moss Treating Zong Mcquarrie/Extender: Maxwell Caul Weeks in Treatment: 1 Active Inactive Electronic Signature(s) Signed: 06/30/2017 4:05:26 PM By: Curtis Sites Entered By: Curtis Sites on 06/30/2017 16:05:26 Jesse Wade ([id number]) -------------------------------------------------------------------------------- Pain Assessment Details Patient Name: Jesse Wade. Date of Service: 06/30/2017 3:15 PM Medical Record Number: [id number] Patient Account Number: [id number] Date of Birth/Sex: [birth year] (52 y.o. Male) Treating RN: Curtis Sites Primary Care Sheniece Ruggles: Vonita Moss Other  Clinician: Referring Devlynn Knoff: Vonita Moss Treating Kendricks Reap/Extender: Maxwell Caul Weeks in Treatment: 1 Active Problems Location of Pain Severity and Description of Pain Patient Has Paino No Site Locations Pain Management and Medication Current Pain Management: Notes Topical or injectable lidocaine is offered to patient for acute pain when surgical debridement is performed. If needed, Patient is instructed to use over the counter pain medication for the following 24-48 hours after debridement. Wound care MDs do not prescribed pain medications. Patient has chronic pain or uncontrolled pain. Patient has been instructed to make an appointment with their Primary Care Physician for pain management. Electronic Signature(s) Signed: 06/30/2017 4:52:33 PM By: Curtis Sites Entered By: Curtis Sites on 06/30/2017 15:27:56 Jesse Wade ([id number]) -------------------------------------------------------------------------------- Patient/Caregiver Education Details Patient Name: Jesse Wade. Date of Service: 06/30/2017 3:15 PM Medical Record Number: [id number] Patient Account Number: [id number] Date of Birth/Gender: [birth year] (52 y.o. Male) Treating RN: Curtis Sites Primary Care Physician: Vonita Moss Other Clinician: Referring Physician:  CRISSMAN, MARK Treating Physician/Extender: Altamese Licking in Treatment: 1 Education Assessment Education Provided To: Patient Education Topics Provided Venous: Handouts: Other: continue compression and f/u with vascular Methods: Explain/Verbal Responses: State content correctly Electronic Signature(s) Signed: 06/30/2017 4:52:33 PM By: Curtis Sites Entered By: Curtis Sites on 06/30/2017 16:07:13 Jesse Wade ([id number]) -------------------------------------------------------------------------------- Wound Assessment Details Patient Name: Jesse Wade. Date of Service: 06/30/2017 3:15 PM Medical Record Number:  [id number] Patient Account Number: [id number] Date of Birth/Sex: [birth year] (52 y.o. Male) Treating RN: Curtis Sites Primary Care Elwin Tsou: Vonita Moss Other Clinician: Referring Daniesha Driver: Vonita Moss Treating Zi Sek/Extender: Maxwell Caul Weeks in Treatment: 1 Wound Status Wound Number: 4 Primary Etiology: Venous Leg Ulcer Wound Location: Right, Medial Lower Leg Wound Status: Healed - Epithelialized Wounding Event: Gradually Appeared Comorbid History: Sleep Apnea, Hypertension Date Acquired: 04/26/2017 Weeks Of Treatment: 1 Clustered Wound: No Photos Photo Uploaded By: Curtis Sites on 06/30/2017 16:11:08 Wound Measurements Length: (cm) 0 % Redu Width: (cm) 0 % Redu Depth: (cm) 0 Epithe Area: (cm) 0 Tunne Volume: (cm) 0 Under ction in Area: 100% ction in Volume: 100% lialization: Medium (34-66%) ling: No mining: No Wound Description Full Thickness Without Exposed Support Classification: Structures Wound Margin: Flat and Intact Exudate Medium Amount: Exudate Type: Serous Exudate Color: amber Foul Odor After Cleansing: No Slough/Fibrino No Wound Bed Granulation Amount: Large (67-100%) Exposed Structure Granulation Quality: Pink Fascia Exposed: No Necrotic Amount: None Present (0%) Fat Layer (Subcutaneous Tissue) Exposed: No Tendon Exposed: No Muscle Exposed: No Joint Exposed: No Bone Exposed: No Jesse Wade, Jesse Wade ([id number]) Periwound Skin Texture Texture Color No Abnormalities Noted: No No Abnormalities Noted: No Callus: No Atrophie Blanche: No Crepitus: No Cyanosis: No Excoriation: No Ecchymosis: No Induration: No Erythema: No Rash: No Hemosiderin Staining: Yes Scarring: No Mottled: No Pallor: No Moisture Rubor: No No Abnormalities Noted: No Dry / Scaly: No Temperature / Pain Maceration: No Temperature: No Abnormality Wound Preparation Ulcer Cleansing: Rinsed/Irrigated with Saline Topical Anesthetic Applied:  None Electronic Signature(s) Signed: 06/30/2017 4:52:33 PM By: Curtis Sites Entered By: Curtis Sites on 06/30/2017 15:53:37 Jesse Wade ([id number]) -------------------------------------------------------------------------------- Wound Assessment Details Patient Name: Jesse Wade. Date of Service: 06/30/2017 3:15 PM Medical Record Number: [id number] Patient Account Number: [id number] Date of Birth/Sex: 07/23/1965 (52 y.o. Male) Treating RN: Curtis Sites Primary Care Taylynn Easton: Vonita Moss Other Clinician: Referring Glendon Dunwoody: Vonita Moss Treating Harbor Vanover/Extender: Maxwell Caul Weeks in Treatment: 1 Wound Status Wound Number: 5 Primary Etiology: Venous Leg Ulcer Wound Location: Left, Medial Lower Leg Wound Status: Healed - Epithelialized Wounding Event: Gradually Appeared Comorbid History: Sleep Apnea, Hypertension Date Acquired: 04/26/2017 Weeks Of Treatment: 1 Clustered Wound: No Photos Photo Uploaded By: Curtis Sites on 06/30/2017 16:11:08 Wound Measurements Length: (cm) 0 % Redu Width: (cm) 0 % Redu Depth: (cm) 0 Epithe Area: (cm) 0 Tunne Volume: (cm) 0 Under ction in Area: 100% ction in Volume: 100% lialization: Medium (34-66%) ling: No mining: No Wound Description Full Thickness Without Exposed Support Classification: Structures Wound Margin: Flat and Intact Exudate Medium Amount: Exudate Type: Serous Exudate Color: amber Foul Odor After Cleansing: No Slough/Fibrino No Wound Bed Granulation Amount: Large (67-100%) Exposed Structure Granulation Quality: Pink Fascia Exposed: No Necrotic Amount: Small (1-33%) Fat Layer (Subcutaneous Tissue) Exposed: No Necrotic Quality: Eschar Tendon Exposed: No Muscle Exposed: No Joint Exposed: No Bone Exposed: No TREMON, SAINVIL D. ([id number]) Periwound Skin Texture Texture Color No Abnormalities Noted: No No Abnormalities Noted: No Callus: No Atrophie Blanche: No Crepitus: No Cyanosis:  No Excoriation:  No Ecchymosis: No Induration: No Erythema: No Rash: No Hemosiderin Staining: Yes Scarring: No Mottled: No Pallor: No Moisture Rubor: No No Abnormalities Noted: No Dry / Scaly: No Temperature / Pain Maceration: No Temperature: No Abnormality Wound Preparation Ulcer Cleansing: Rinsed/Irrigated with Saline Topical Anesthetic Applied: None Electronic Signature(s) Signed: 06/30/2017 4:52:33 PM By: Curtis Sites Entered By: Curtis Sites on 06/30/2017 15:53:37 Jesse Wade ([id number]) -------------------------------------------------------------------------------- Vitals Details Patient Name: Jesse Wade. Date of Service: 06/30/2017 3:15 PM Medical Record Number: [id number] Patient Account Number: [id number] Date of Birth/Sex: [birth year] (51 y.o. Male) Treating RN: Curtis Sites Primary Care Mysti Haley: Vonita Moss Other Clinician: Referring Sylvan Sookdeo: Vonita Moss Treating Eisa Necaise/Extender: Maxwell Caul Weeks in Treatment: 1 Vital Signs Time Taken: 15:30 Temperature (F): 98.3 Height (in): 77 Pulse (bpm): 61 Weight (lbs): 305 Respiratory Rate (breaths/min): 18 Body Mass Index (BMI): 36.2 Blood Pressure (mmHg): 135/72 Reference Range: 80 - 120 mg / dl Electronic Signature(s) Signed: 06/30/2017 4:52:33 PM By: Curtis Sites Entered By: Curtis Sites on 06/30/2017 15:30:42

## 2017-07-01 NOTE — Progress Notes (Signed)
MITSUGI, SCHRADER ([id number]) Visit Report for 06/30/2017 HPI Details Patient Name: Jesse Wade, Jesse Wade. Date of Service: 06/30/2017 3:15 PM Medical Record Number: [id number] Patient Account Number: [id number] Date of Birth/Sex: [birth year] (52 y.o. Male) Treating RN: Curtis Sites Primary Care Provider: Vonita Moss Other Clinician: Referring Provider: Vonita Moss Treating Provider/Extender: Maxwell Caul Weeks in Treatment: 1 History of Present Illness HPI Description: 10/06/16; this is a 52 year old man who is not a diabetic or a smoker. He has a history of chronic venous insufficiency having had venous ablations on both legs in 2012. These were done locally although I known have the exact reports and I am not exactly sure which superficial veins he had ablated. In any case he has been left with chronic venous insufficiency with stasis dermatitis. He has had small wounds in the last several years that of mostly healed on their own with minimal interventions. He saw dermatology and was given a combination of nystatin and triamcinolone which seemed to help for a while. He tells me is had a wound on the left medial lower leg for the last several months. He is been putting topical creams on this. He finds this very itchy and irritating and difficult not to scratch. He also has wounds on the right anterior lower leg and the left lateral lower leg ABIs in this clinic per 1.2 on the right 1.28 on the left. 10/13/16; patient's wounds on the left leg of largely closed over. A small wound on the right anterior leg. We is Prisma tall the wounds wrapped the left leg but left the right leg with border foam the erythema on the medial aspect of the left leg is a lot better. The patient has a history of venous ablations done at the [facility] but I think his actual surgeon has left. He has seen Dr. Lorretta Harp in the past however. He has 20-30 mm below-knee stockings that he is religiously  compliant according to the patient and his wife who is present. He has new stockings at home from [facility] and [place]. 10/20/16; the patient's wounds on his left leg closed over last week. The small remaining wound on the right anterior leg is also closed. He has new compression 20-30 mm stockings that he brought at [facility] in [place]. His edema looks to be well controlled READMISSION 06/23/17;patient returns to clinic now with a 2 month history of bilateral lower extremity wounds. He has 20-30 mm compression stockings and he has been compliant. The area on the left is much more extensive than the area on the right there is no real pain but he states that it is pruritic. He has a history of venous stasis dermatitis of the lower extremities.he has had vascular procedures in both legs in 2012 by  [facility]. He is not a diabetic is had no prior arterial studies. Previous ABIs in our clinic were both within the normal range at 1.2 06/30/17; the patient came back to clinic last week. He had a fairly large albeit superficial area on his left medial ankle and lower leg and a smaller area on the right medial. We used TCA silver alginate and put him in 3 layer compressionson the left in his own compression stocking on the right. We arranged a follow-up vascular review. He has had previous ablations on this area in 2012. He has 20-30 mm compression stockings which he gets out of [facility] in [place]. He used the compression stockings on his  right leg and 3 layer compression on the left Electronic Signature(s) Signed: 06/30/2017 4:32:47 PM By: Baltazar Najjarobson, Dyland Panuco MD Entered By: Baltazar Najjarobson, Jaque Dacy on 06/30/2017 16:21:26 Rhoderick MoodyAIKEN, Myrick D. ([id number]) -------------------------------------------------------------------------------- Physical Exam Details Patient Name: Rhoderick MoodyAIKEN, Nolon D. Date of Service: 06/30/2017 3:15 PM Medical Record Number: [id number] Patient Account Number:  [id number] Date of Birth/Sex: [birth year] 4(51 y.o. Male) Treating RN: Curtis Sitesorthy, Joanna Primary Care Provider: Vonita MossRISSMAN, MARK Other Clinician: Referring Provider: Vonita MossRISSMAN, MARK Treating Provider/Extender: Maxwell CaulOBSON, Claudy Abdallah G Weeks in Treatment: 1 Cardiovascular Pedal pulses palpable and strong bilaterally.. Notes wound exam; the patient has a substantial but superficial area on the left medial calf. This is fully epithelialized but there is significant venous inflammation in this area/stasis dermatitis. Smaller similar area on the right medial leg. There is no open area in either area Electronic Signature(s) Signed: 06/30/2017 4:32:47 PM By: Baltazar Najjarobson, Jilliana Burkes MD Entered By: Baltazar Najjarobson, Birl Lobello on 06/30/2017 16:23:24 Rhoderick MoodyAIKEN, Mat D. ([id number]) -------------------------------------------------------------------------------- Physician Orders Details Patient Name: Rhoderick MoodyAIKEN, Kelven D. Date of Service: 06/30/2017 3:15 PM Medical Record Number: [id number] Patient Account Number: [id number] Date of Birth/Sex: [birth year] [age] y.o. Male) Treating RN: Curtis Sitesorthy, Joanna Primary Care Provider: Vonita MossRISSMAN, MARK Other Clinician: Referring Provider: Vonita MossRISSMAN, MARK Treating Provider/Extender: Altamese CarolinaOBSON, Brinden Kincheloe G Weeks in Treatment: 1 Verbal / Phone Orders: No Diagnosis [facility]oding [facility] Discharge from Wound Care Center - Continue wearing your compression socks daily and follow up with the vascular doctors Electronic Signature(s) Signed: 06/30/2017 4:32:47 PM By: Baltazar Najjarobson, Jaonna Word MD Signed: 06/30/2017 4:52:33 PM By: Curtis Sitesorthy, Joanna Entered By: Curtis Sitesorthy, Joanna on 06/30/2017 15:54:13 Rhoderick MoodyAIKEN, Benji D. ([id number]) -------------------------------------------------------------------------------- Problem List Details Patient Name: Rhoderick MoodyIKEN, Megan D. Date of Service: 06/30/2017 3:15 PM Medical Record Number: [id number] Patient Account Number: [id number] Date of Birth/Sex: [birth year] [age] y.o. Male) Treating RN: Curtis Sitesorthy,  Joanna Primary Care Provider: Vonita MossRISSMAN, MARK Other Clinician: Referring Provider: Vonita MossRISSMAN, MARK Treating Provider/Extender: Altamese CarolinaOBSON, Gretel Cantu G Weeks in Treatment: 1 Active Problems ICD-10 Encounter Code Description Active Date Diagnosis I87.333 Chronic venous hypertension (idiopathic) with ulcer and 06/23/2017 Yes inflammation of bilateral lower extremity L97.221 Non-pressure chronic ulcer of left calf limited to breakdown of skin 06/23/2017 Yes L97.211 Non-pressure chronic ulcer of right calf limited to breakdown of skin 06/23/2017 Yes Inactive Problems Resolved Problems Electronic Signature(s) Signed: 06/30/2017 4:32:47 PM By: Baltazar Najjarobson, Dontae Minerva MD Entered By: Baltazar Najjarobson, Diar Berkel on 06/30/2017 16:19:24 Rhoderick MoodyAIKEN, Termaine D. ([id number]) -------------------------------------------------------------------------------- Progress Note Details Patient Name: Rhoderick MoodyAIKEN, Sami D. Date of Service: 06/30/2017 3:15 PM Medical Record Number: [id number] Patient Account Number: [id number] Date of Birth/Sex: [birth year](51 y.o. Male) Treating RN: Curtis Sitesorthy, Joanna Primary Care Provider: Vonita MossRISSMAN, MARK Other Clinician: Referring Provider: Vonita MossRISSMAN, MARK Treating Provider/Extender: Maxwell CaulOBSON, Eltha Tingley G Weeks in Treatment: 1 Subjective History of Present Illness (HPI) 10/06/16; this is a 52 year old man who is not a diabetic or a smoker. He has a history of chronic venous insufficiency having had venous ablations on both legs in 2012. These were done locally although I known have the exact reports and I am not exactly sure which superficial veins he had ablated. In any case he has been left with chronic venous insufficiency with stasis dermatitis. He has had small wounds in the last several years that of mostly healed on their own with minimal interventions. He saw dermatology and was given a combination of nystatin and triamcinolone which seemed to help for a while. He tells me is had a wound on the left medial lower leg for the  last several months. He is been putting topical creams on this. He finds  this very itchy and irritating and difficult not to scratch. He also has wounds on the right anterior lower leg and the left lateral lower leg ABIs in this clinic per 1.2 on the right 1.28 on the left. 10/13/16; patient's wounds on the left leg of largely closed over. A small wound on the right anterior leg. We is Prisma tall the wounds wrapped the left leg but left the right leg with border foam the erythema on the medial aspect of the left leg is a lot better. The patient has a history of venous ablations done at the local [facility] vascular group but I think his actual surgeon has left. He has seen Dr. Lorretta Harp in the past however. He has 20-30 mm below-knee stockings that he is religiously compliant according to the patient and his wife who is present. He has new stockings at home from [facility] and [place]. 10/20/16; the patient's wounds on his left leg closed over last week. The small remaining wound on the right anterior leg is also closed. He has new compression 20-30 mm stockings that he brought at [facility] in [place]. His edema looks to be well controlled READMISSION 06/23/17;patient returns to clinic now with a 2 month history of bilateral lower extremity wounds. He has 20-30 mm compression stockings and he has been compliant. The area on the left is much more extensive than the area on the right there is no real pain but he states that it is pruritic. He has a history of venous stasis dermatitis of the lower extremities.he has had vascular procedures in both legs in 2012 by [facility]. He is not a diabetic is had no prior arterial studies. Previous ABIs in our clinic were both within the normal range at 1.2 06/30/17; the patient came back to clinic last week. He had a fairly large albeit superficial area on his left medial ankle and lower leg and a smaller area on the right medial. We used  TCA silver alginate and put him in 3 layer compressionson the left in his own compression stocking on the right. We arranged a follow-up vascular review. He has had previous ablations on this area in 2012. He has 20-30 mm compression stockings which he gets out of [facility] in [place]. He used the compression stockings on his right leg and 3 layer compression on the left Objective Constitutional KIYAN, BURMESTER ([id number]) Vitals Time Taken: 3:30 PM, Height: 77 in, Weight: 305 lbs, BMI: 36.2, Temperature: 98.3 F, Pulse: 61 bpm, Respiratory Rate: 18 breaths/min, Blood Pressure: 135/72 mmHg. Cardiovascular Pedal pulses palpable and strong bilaterally.. General Notes: wound exam; the patient has a substantial but superficial area on the left medial calf. This is fully epithelialized but there is significant venous inflammation in this area/stasis dermatitis. Smaller similar area on the right medial leg. There is no open area in either area Integumentary (Hair, Skin) Wound #4 status is Healed - Epithelialized. Original cause of wound was Gradually Appeared. The wound is located on the Right,Medial Lower Leg. The wound measures 0cm length x 0cm width x 0cm depth; 0cm^2 area and 0cm^3 volume. There is no tunneling or undermining noted. There is a medium amount of serous drainage noted. The wound margin is flat and intact. There is large (67-100%) pink granulation within the wound bed. There is no necrotic tissue within the wound bed. The periwound skin appearance exhibited: Hemosiderin Staining. The periwound skin appearance did not exhibit: Callus, Crepitus, Excoriation, Induration, Rash, Scarring, Dry/Scaly,  Maceration, Atrophie Blanche, Cyanosis, Ecchymosis, Mottled, Pallor, Rubor, Erythema. Periwound temperature was noted as No Abnormality. Wound #5 status is Healed - Epithelialized. Original cause of wound was Gradually Appeared. The wound is located on the Left,Medial Lower Leg. The  wound measures 0cm length x 0cm width x 0cm depth; 0cm^2 area and 0cm^3 volume. There is no tunneling or undermining noted. There is a medium amount of serous drainage noted. The wound margin is flat and intact. There is large (67-100%) pink granulation within the wound bed. There is a small (1-33%) amount of necrotic tissue within the wound bed including Eschar. The periwound skin appearance exhibited: Hemosiderin Staining. The periwound skin appearance did not exhibit: Callus, Crepitus, Excoriation, Induration, Rash, Scarring, Dry/Scaly, Maceration, Atrophie Blanche, Cyanosis, Ecchymosis, Mottled, Pallor, Rubor, Erythema. Periwound temperature was noted as No Abnormality. Assessment Active Problems ICD-10 I87.333 - Chronic venous hypertension (idiopathic) with ulcer and inflammation of bilateral lower extremity L97.221 - Non-pressure chronic ulcer of left calf limited to breakdown of skin L97.211 - Non-pressure chronic ulcer of right calf limited to breakdown of skin Plan Discharge From [facility] Services: Discharge from Wound Care Center - Continue wearing your compression socks daily and follow up with the vascular doctors #1 I think the patient can be discharged from the wound care center [name] ([id number]) #2 I have continued him and his 20-30 mm below-knee stockings with careful daily skin lubrication to the lower legs especially medial lower legs bilaterally. #3 I think is reasonable to go back and see the vascular surgeons apparently previous ablations. This may need to be followed up on. #4 he does not have obvious arterial issues Electronic Signature(s) Signed: 06/30/2017 4:32:47 PM By: Baltazar Najjar MD Entered By: Baltazar Najjar on 06/30/2017 16:24:22 [name] ([id number]) -------------------------------------------------------------------------------- SuperBill Details Patient Name: [name]. Date of Service: 06/30/2017 Medical Record Number: [id number] Patient  Account Number: [id number] Date of Birth/Sex: [birth year] (52 y.o. Male) Treating RN: Curtis Sites Primary Care Provider: Vonita Moss Other Clinician: Referring Provider: Vonita Moss Treating Provider/Extender: Maxwell Caul Weeks in Treatment: 1 Diagnosis Coding ICD-10 Codes Code Description [phone] Chronic venous hypertension (idiopathic) with ulcer and inflammation of bilateral lower extremity L97.221 Non-pressure chronic ulcer of left calf limited to breakdown of skin L97.211 Non-pressure chronic ulcer of right calf limited to breakdown of skin Facility Procedures CPT4 Code: [id number] Description: 99213 - WOUND CARE VISIT-LEV 3 EST PT Modifier: Quantity: 1 Physician Procedures CPT4 Code: [id number] Description: 01027 - WC PHYS LEVEL 2 - EST PT ICD-10 Diagnosis Description L97.221 Non-pressure chronic ulcer of left calf limited to breakdow L97.211 Non-pressure chronic ulcer of right calf limited to breakdo Modifier: n of skin wn of skin Quantity: 1 Electronic Signature(s) Signed: 06/30/2017 4:32:47 PM By: Baltazar Najjar MD Entered By: Baltazar Najjar on 06/30/2017 16:24:43

## 2017-07-05 ENCOUNTER — Other Ambulatory Visit: Payer: Self-pay

## 2017-07-05 MED ORDER — BENAZEPRIL HCL 40 MG PO TABS: 40.0000 mg | ORAL_TABLET | Freq: Every day | ORAL | 1 refills | Status: DC

## 2017-07-05 NOTE — Telephone Encounter (Signed)
apt 

## 2017-07-07 ENCOUNTER — Other Ambulatory Visit: Payer: Self-pay | Admitting: Family Medicine

## 2017-07-07 ENCOUNTER — Ambulatory Visit: Payer: BLUE CROSS/BLUE SHIELD | Admitting: Family Medicine

## 2017-07-07 DIAGNOSIS — T849XXD Unspecified complication of internal orthopedic prosthetic device, implant and graft, subsequent encounter: Secondary | ICD-10-CM | POA: Diagnosis not present

## 2017-07-07 DIAGNOSIS — M76829 Posterior tibial tendinitis, unspecified leg: Secondary | ICD-10-CM | POA: Diagnosis not present

## 2017-07-07 DIAGNOSIS — M76821 Posterior tibial tendinitis, right leg: Secondary | ICD-10-CM | POA: Diagnosis not present

## 2017-07-07 DIAGNOSIS — Z981 Arthrodesis status: Secondary | ICD-10-CM | POA: Diagnosis not present

## 2017-07-08 ENCOUNTER — Ambulatory Visit: Payer: BLUE CROSS/BLUE SHIELD | Admitting: Family Medicine

## 2017-07-08 ENCOUNTER — Encounter: Payer: Self-pay | Admitting: Family Medicine

## 2017-07-08 VITALS — BP 127/75 | HR 71 | Wt 310.0 lb

## 2017-07-08 DIAGNOSIS — I10 Essential (primary) hypertension: Secondary | ICD-10-CM

## 2017-07-08 DIAGNOSIS — L989 Disorder of the skin and subcutaneous tissue, unspecified: Secondary | ICD-10-CM

## 2017-07-08 MED ORDER — PREDNISONE 10 MG PO TABS: ORAL_TABLET | ORAL | 0 refills | Status: DC

## 2017-07-08 NOTE — Assessment & Plan Note (Signed)
The current medical regimen is effective;  continue present plan and medications.  

## 2017-07-08 NOTE — Progress Notes (Signed)
BP 127/75   Pulse 71   Wt (!) 310 lb (140.6 kg)   SpO2 98%   BMI 36.76 kg/m    Subjective:    Patient ID: Jesse Wade Falconi, male    DOB: 03/28/1966, 52 y.o.   MRN: [id number]  HPI: Jesse Wade Pressman is a 52 y.o. male  Chief Complaint  Patient presents with  . Rash    Itchy, x 1.5 weeks   Patient with itching rash both legs arms back area no known contact irritants inflamed excoriated areas that is because they itch so much. This been ongoing for almost 2 weeks no fever or chills Blood pressures been doing well no complaints from medication No systemic symptoms. Relevant past medical, surgical, family and social history reviewed and updated as indicated. Interim medical history since our last visit reviewed. Allergies and medications reviewed and updated.  Review of Systems  Constitutional: Negative.   Respiratory: Negative.   Cardiovascular: Negative.     Per HPI unless specifically indicated above     Objective:    BP 127/75   Pulse 71   Wt (!) 310 lb (140.6 kg)   SpO2 98%   BMI 36.76 kg/m   Wt Readings from Last 3 Encounters:  07/08/17 (!) 310 lb (140.6 kg)  10/16/16 (!) 328 lb (148.8 kg)  10/05/16 (!) 328 lb 12.8 oz (149.1 kg)    Physical Exam  Constitutional: He is oriented to person, place, and time. He appears well-developed and well-nourished.  HENT:  Head: Normocephalic and atraumatic.  Eyes: Conjunctivae and EOM are normal.  Neck: Normal range of motion.  Cardiovascular: Normal rate, regular rhythm and normal heart sounds.  Pulmonary/Chest: Effort normal and breath sounds normal.  Musculoskeletal: Normal range of motion.  Neurological: He is alert and oriented to person, place, and time.  Skin: No erythema.  Psychiatric: He has a normal mood and affect. His behavior is normal. Judgment and thought content normal.     Results for orders placed or performed in visit on 06/18/15  Lipid Panel w/o Chol/HDL Ratio  Result Value Ref Range   Cholesterol,  Total 146 100 - 199 mg/dL   Triglycerides 82 0 - 149 mg/dL   HDL 40 >84>39 mg/dL   VLDL Cholesterol Cal 16 5 - 40 mg/dL   LDL Calculated 90 0 - 99 mg/dL  PSA  Result Value Ref Range   Prostate Specific Ag, Serum 0.5 0.0 - 4.0 ng/mL  Comprehensive metabolic panel  Result Value Ref Range   Glucose 97 65 - 99 mg/dL   BUN 12 6 - 24 mg/dL   Creatinine, Ser 6.960.91 0.76 - 1.27 mg/dL   GFR calc non Af Amer 99 >59 mL/min/1.73   GFR calc Af Amer 114 >59 mL/min/1.73   BUN/Creatinine Ratio 13 9 - 20   Sodium 143 134 - 144 mmol/L   Potassium 4.3 3.5 - 5.2 mmol/L   Chloride 104 96 - 106 mmol/L   CO2 24 18 - 29 mmol/L   Calcium 9.2 8.7 - 10.2 mg/dL   Total Protein 6.4 6.0 - 8.5 g/dL   Albumin 4.0 3.5 - 5.5 g/dL   Globulin, Total 2.4 1.5 - 4.5 g/dL   Albumin/Globulin Ratio 1.7 1.1 - 2.5   Bilirubin Total 0.3 0.0 - 1.2 mg/dL   Alkaline Phosphatase 85 39 - 117 IU/L   AST 22 0 - 40 IU/L   ALT 28 0 - 44 IU/L      Assessment & Plan:  Problem List Items Addressed This Visit      Cardiovascular and Mediastinum   Hypertension    The current medical regimen is effective;  continue present plan and medications.        Other Visit Diagnoses    Dermatosis    -  Primary    Discussed dermatitis care and treatment use of prednisone better refer to dermatology.  Follow up plan: Return for Physical Exam.

## 2017-07-20 ENCOUNTER — Encounter (INDEPENDENT_AMBULATORY_CARE_PROVIDER_SITE_OTHER): Payer: Self-pay | Admitting: Vascular Surgery

## 2017-07-20 ENCOUNTER — Ambulatory Visit (INDEPENDENT_AMBULATORY_CARE_PROVIDER_SITE_OTHER): Payer: BLUE CROSS/BLUE SHIELD | Admitting: Vascular Surgery

## 2017-07-20 VITALS — BP 134/70 | HR 63 | Resp 17 | Ht 77.5 in | Wt 314.0 lb

## 2017-07-20 DIAGNOSIS — I83023 Varicose veins of left lower extremity with ulcer of ankle: Secondary | ICD-10-CM

## 2017-07-20 DIAGNOSIS — L97321 Non-pressure chronic ulcer of left ankle limited to breakdown of skin: Secondary | ICD-10-CM

## 2017-07-20 DIAGNOSIS — I10 Essential (primary) hypertension: Secondary | ICD-10-CM

## 2017-07-20 DIAGNOSIS — I83009 Varicose veins of unspecified lower extremity with ulcer of unspecified site: Secondary | ICD-10-CM | POA: Insufficient documentation

## 2017-07-20 DIAGNOSIS — L97909 Non-pressure chronic ulcer of unspecified part of unspecified lower leg with unspecified severity: Secondary | ICD-10-CM | POA: Insufficient documentation

## 2017-07-20 NOTE — Patient Instructions (Signed)

## 2017-07-20 NOTE — Assessment & Plan Note (Signed)
blood pressure control important in reducing the progression of atherosclerotic disease. On appropriate oral medications.  

## 2017-07-20 NOTE — Assessment & Plan Note (Signed)
The patient has a long history of venous insufficiency and has had previous venous intervention.  He has recurrent ulceration of the left lower extremity previous ulceration of the right lower extremity and I think a venous reflux study at this point would be prudent.  He could certainly have recanalization of the saphenous vein or another venous system that is now problematic causing his symptoms.  Venous intervention would be expected to be helpful in reducing recurrent risk of infection and ulceration as well as reduce his swelling.  The patient will return after his venous duplex.  We discussed the pathophysiology and natural history of venous disease today.

## 2017-07-20 NOTE — Progress Notes (Signed)
Patient ID: Jesse Wade, male   DOB: Oct 24, 1965, 52 y.o.   MRN: [id number]  Chief Complaint  Patient presents with  . New Patient (Initial Visit)    Varicose veins and wound    HPI Jesse Wade is a 52 y.o. male.  I am asked to see the patient by Dr. Leanord Hawking for evaluation of varicose veins and a non-healing wound.  The patient reports previously having varicose veins treated with a laser about 7 years ago by physician community.  He did well for several months but then began having recurrent ulcerations and varicosities over the past couple of years.  He wears compression stockings every day.  The swelling has been kept under reasonably good control.  He denies fever or chills.  No clear inciting event or causative factor that started the symptoms.  No previous history of DVT.  The right leg has a healed ulceration while the left leg has an open ulceration currently.   Past Medical History:  Diagnosis Date  . Allergic rhinitis   . Headache    H/O MIGRAINES  . Hypertension   . Sleep apnea    BIPAP OR CPAP-PT UNSURE WHICH ONE    Past Surgical History:  Procedure Laterality Date  . ANKLE SURGERY Right 2014, 2015   x3  . INGUINAL HERNIA REPAIR Bilateral    childhood  . TONSILLECTOMY    . UMBILICAL HERNIA REPAIR N/A 02/25/2016   Procedure: HERNIA REPAIR UMBILICAL ADULT;  Surgeon: Kieth Brightly, MD;  Location: [facility] ORS;  Service: General;  Laterality: N/A;    Family History  Problem Relation Age of Onset  . Heart disease Mother   . Alcohol abuse Father   . Heart disease Maternal Aunt   . Heart disease Maternal Grandmother   . COPD Maternal Grandmother   . Diabetes Maternal Grandfather   . Cancer Neg Hx   . Hypertension Neg Hx   . Stroke Neg Hx      Social History Social History   Tobacco Use  . Smoking status: Never Smoker  . Smokeless tobacco: Never Used  Substance Use Topics  . Alcohol use: Yes    Comment: OCC  . Drug use: No     Allergies    Allergen Reactions  . Amlodipine Other (See Comments)    Edema   . Penicillins Rash    Current Outpatient Medications  Medication Sig Dispense Refill  . benazepril (LOTENSIN) 40 MG tablet Take 1 tablet (40 mg total) by mouth daily. 30 tablet 1  . levocetirizine (XYZAL) 5 MG tablet Take 5 mg by mouth every evening.   6  . meloxicam (MOBIC) 7.5 MG tablet Take by mouth.     . montelukast (SINGULAIR) 10 MG tablet Take 1 tablet (10 mg total) by mouth at bedtime. 90 tablet 3  . predniSONE (DELTASONE) 10 MG tablet Taking 5 a day for 5 days (Patient not taking: Reported on 07/20/2017) 25 tablet 0   No current facility-administered medications for this visit.       REVIEW OF SYSTEMS (Negative unless checked)  Constitutional: [] Weight loss  [] Fever  [] Chills Cardiac: [] Chest pain   [] Chest pressure   [] Palpitations   [] Shortness of breath when laying flat   [] Shortness of breath at rest   [] Shortness of breath with exertion. Vascular:  [] Pain in legs with walking   [] Pain in legs at rest   [] Pain in legs when laying flat   [] Claudication   [] Pain in feet  when walking  [] Pain in feet at rest  [] Pain in feet when laying flat   [] History of DVT   [] Phlebitis   [x] Swelling in legs   [x] Varicose veins   [x] Non-healing ulcers Pulmonary:   [] Uses home oxygen   [] Productive cough   [] Hemoptysis   [] Wheeze  [] COPD   [] Asthma Neurologic:  [] Dizziness  [] Blackouts   [] Seizures   [] History of stroke   [] History of TIA  [] Aphasia   [] Temporary blindness   [] Dysphagia   [] Weakness or numbness in arms   [] Weakness or numbness in legs Musculoskeletal:  [x] Arthritis   [] Joint swelling   [] Joint pain   [] Low back pain Hematologic:  [] Easy bruising  [] Easy bleeding   [] Hypercoagulable state   [] Anemic  [] Hepatitis Gastrointestinal:  [] Blood in stool   [] Vomiting blood  [] Gastroesophageal reflux/heartburn   [] Abdominal pain Genitourinary:  [] Chronic kidney disease   [] Difficult urination  [] Frequent urination   [] Burning with urination   [] Hematuria Skin:  [] Rashes   [x] Ulcers   [x] Wounds Psychological:  [] History of anxiety   []  History of major depression.    Physical Exam BP 134/70 (BP Location: Right Arm, Patient Position: Sitting)   Pulse 63   Resp 17   Ht 6' 5.5" (1.969 m)   Wt (!) 142.4 kg (314 lb)   BMI 36.76 kg/m  Gen:  WD/WN, NAD Head: [place]/AT, No temporalis wasting.  Ear/Nose/Throat: Hearing grossly intact, nares w/o erythema or drainage, oropharynx w/o Erythema/Exudate Eyes: Conjunctiva clear, sclera non-icteric  Neck: trachea midline.  No JVD.  Pulmonary:  Good air movement, respirations not labored, no use of accessory muscles Cardiac: RRR, no JVD Vascular: Prominent varicosities present particularly in the left lower extremity and scattered on the right lower extremity.  These measure 2-3 mm in diameter in the calf and medial thigh area on the left Vessel Right Left  Radial Palpable Palpable                          PT Palpable Palpable  DP Palpable Palpable   Gastrointestinal: soft, non-tender/non-distended.  Musculoskeletal: M/S 5/5 throughout.  Extremities without ischemic changes.  No deformity or atrophy.  1+ bilateral lower extremity edema.  Healed ulceration just above the medial ankle on the right. irregular scab and superficial ulceration just above the left medial ankle Neurologic: Sensation grossly intact in extremities.  Symmetrical.  Speech is fluent. Motor exam as listed above. Psychiatric: Judgment intact, Mood & affect appropriate for pt's clinical situation. Dermatologic: Left lower leg ulceration as above   Radiology No results found.  Labs No results found for this or any previous visit (from the past 2160 hour(s)).  Assessment/Plan:  Hypertension blood pressure control important in reducing the progression of atherosclerotic disease. On appropriate oral medications.   Varicose veins of lower extremities with ulcer (HCC) The patient has a  long history of venous insufficiency and has had previous venous intervention.  He has recurrent ulceration of the left lower extremity previous ulceration of the right lower extremity and I think a venous reflux study at this point would be prudent.  He could certainly have recanalization of the saphenous vein or another venous system that is now problematic causing his symptoms.  Venous intervention would be expected to be helpful in reducing recurrent risk of infection and ulceration as well as reduce his swelling.  The patient will return after his venous duplex.  We discussed the pathophysiology and natural history of venous disease today.  Festus BarrenJason Izaih Kataoka 07/20/2017, 1:29 PM   This note was created with Dragon medical transcription system.  Any errors from dictation are unintentional.

## 2017-07-21 ENCOUNTER — Ambulatory Visit (INDEPENDENT_AMBULATORY_CARE_PROVIDER_SITE_OTHER): Payer: BLUE CROSS/BLUE SHIELD

## 2017-07-21 DIAGNOSIS — L97321 Non-pressure chronic ulcer of left ankle limited to breakdown of skin: Secondary | ICD-10-CM | POA: Diagnosis not present

## 2017-07-21 DIAGNOSIS — I83023 Varicose veins of left lower extremity with ulcer of ankle: Secondary | ICD-10-CM

## 2017-07-30 ENCOUNTER — Telehealth (INDEPENDENT_AMBULATORY_CARE_PROVIDER_SITE_OTHER): Payer: Self-pay | Admitting: Vascular Surgery

## 2017-07-30 NOTE — Telephone Encounter (Signed)
Patient is coming in the office for an appointment on 08/03/17

## 2017-07-30 NOTE — Telephone Encounter (Signed)
Pt had reflux study on 07/21/17

## 2017-08-03 ENCOUNTER — Ambulatory Visit (INDEPENDENT_AMBULATORY_CARE_PROVIDER_SITE_OTHER): Payer: BLUE CROSS/BLUE SHIELD | Admitting: Vascular Surgery

## 2017-08-03 ENCOUNTER — Encounter (INDEPENDENT_AMBULATORY_CARE_PROVIDER_SITE_OTHER): Payer: Self-pay | Admitting: Vascular Surgery

## 2017-08-03 VITALS — BP 126/71 | HR 60 | Resp 17 | Ht 77.0 in | Wt 315.4 lb

## 2017-08-03 DIAGNOSIS — L97321 Non-pressure chronic ulcer of left ankle limited to breakdown of skin: Secondary | ICD-10-CM

## 2017-08-03 DIAGNOSIS — I83023 Varicose veins of left lower extremity with ulcer of ankle: Secondary | ICD-10-CM

## 2017-08-03 DIAGNOSIS — I10 Essential (primary) hypertension: Secondary | ICD-10-CM

## 2017-08-03 NOTE — Patient Instructions (Signed)
Varicose Vein Surgery Varicose vein surgery is a procedure to remove or repair varicose veins. These are swollen, twisted veins that are visible under the skin, especially in your legs. These veins may appear blue and bulging. All veins have a valve that keeps blood flowing in only one direction. If these valves get weak or damaged, blood can pool and cause varicose veins. You may have varicose vein surgery if your varicose veins are causing symptoms or complications, or if lifestyle changes have not helped. The surgery can reduce pain, aching, and the risk of bleeding and blood clots. Surgery can also improve the way the affected area looks (cosmetic appearance). The different types of varicose vein surgery include:  Injecting a chemical to close off a vein (sclerotherapy).  Treating a vein with light energy (laser treatment).  Using lasers or radio waves to close off a vein with heat (radiofrequency vein ablation).  Surgically removing the vein through a small incision (phlebectomy).  Surgically removing the vein through incisions after the vein has been tied off (vein ligation and stripping).  Your health care provider will discuss the method that is best for you based on your condition. Tell a health care provider about:  Any allergies you have.  All medicines you are taking, including vitamins, herbs, eye drops, creams, and over-the-counter medicines.  Any problems you or family members have had with anesthetic medicines.  Any blood disorders you have.  Any surgeries you have had.  Any medical conditions you have. What are the risks? Generally, this is a safe procedure. However, problems can occur and include:  Damage to nearby nerves, tissues, or veins.  Sores.  Dark spots.  Skin irritation.  Numbness.  Clotting.  Infection.  Scarring.  Leg swelling.  Need for additional treatments.  What happens before the procedure?  Ask your health care provider  about: ? Changing or stopping your regular medicines. This is especially important if you are taking diabetes medicines or blood thinners. ? Taking medicines such as aspirin and ibuprofen. These medicines can thin your blood. Do not take these medicines before your procedure if your health care provider instructs you not to.  You may have tests before varicose vein surgery. These can include a test to: ? Check for clots and check blood flow using sound waves (Doppler ultrasound). ? Observe how blood flows through your veins by injecting a dye that outlines your veins on X-rays (angiogram). This test is used in rare cases. What happens during the procedure? The procedure will vary depending on which type of varicose vein surgery you have: Sclerotherapy  This procedure is often used for small to medium veins.  A chemical (sclerosant) that irritates the lining of the vein will be injected into the vein. This will cause the varicose vein to be closed off.  Sclerosants in different amounts and strengths can be used, depending on the size and location of the vein.  You may need more than one treatment. Laser Treatment  This procedure does not involve incisions or chemicals.  Light energy from a laser will be directed onto the vein.  Laser treatment may be combined with sclerotherapy.  You may need more than one treatment. Radiofrequency Vein Ablation  You will be given a medicine that numbs the area (local anesthetic).  A small surgical cut (incision) will be made near the varicose vein.  A narrow tube (catheter) will be threaded into your vein.  The tip of the catheter will use either a laser or   radio waves to close the vein with heat. Phlebectomy  This surgical procedure is used to remove the veins closest to the skin.  You will be given a medicine that numbs the area (local anesthetic).  The surgeon will make a small puncture close to the varicose vein and then use a tiny hook  to pull out the vein. Vein Ligation and Stripping  This surgical procedure is used to treat severe cases.  For this procedure, you will be given a medicine that makes you go to sleep (general anesthetic).  The surgeon will make a small incision near the vein in your groin (saphenous vein).  First the surgeon will tie off (ligate) the vein.  Then the surgeon will make several more incisions along the vein.  The vein will be removed (stripped).  It may take 1-4 weeks to recover completely. What happens after the procedure?  Depending on the type of procedure that is performed, you may be able to return to your usual activities the day after the procedure.  You may have to wear compression stockings. These stockings help to prevent blood clots and reduce swelling in your legs. This information is not intended to replace advice given to you by your health care provider. Make sure you discuss any questions you have with your health care provider. Document Released: 06/28/2007 Document Revised: 11/14/2015 Document Reviewed: 11/15/2013 Elsevier Interactive Patient Education  2018 [employer]  

## 2017-08-03 NOTE — Assessment & Plan Note (Signed)
Recommend  I have reviewed my previous  discussion with the patient regarding  varicose veins and why they cause symptoms. Patient will continue  wearing graduated compression stockings class 1 on a daily basis, beginning first thing in the morning and removing them in the evening.    In addition, behavioral modification including elevation during the day was again discussed and this will continue.  The patient has utilized over the counter pain medications and has been exercising.  However, at this time conservative therapy has not alleviated the patient's symptoms of leg pain and swelling, and with a history of ulceration intervention is warranted to prevent recurrence.  Recommend: laser ablation of the right and  left great saphenous veins to eliminate the symptoms of pain and swelling of the lower extremities caused by the severe superficial venous reflux disease.  This should also help prevent recurrence of the ulceration.  Likely, foam sclerotherapy will be necessary on the large incompetent anterior accessory saphenous vein as well.  This would also be present bilaterally although the left is likely the worse of the 2.

## 2017-08-03 NOTE — Progress Notes (Signed)
MRN : [id number]  Jesse MoodyJohn D Wade is a 52 y.o. (01-21-1966) male who presents with chief complaint of  Chief Complaint  Patient presents with  . Follow-up    reflux results  .  History of Present Illness: Patient returns today in follow up of venous insufficiency with ulceration.  He is wearing his compression stockings daily.  His ulceration has now basically healed on the left leg.  He has a previous history of ulceration on the right as well.  His duplex today demonstrates reflux and recanalized great saphenous vein on the right as well as the proximal great saphenous vein and great saphenous vein in the thigh on the left.  There is also anterior accessory saphenous vein reflux bilaterally.       Past Medical History:  Diagnosis Date  . Allergic rhinitis   . Headache    H/O MIGRAINES  . Hypertension   . Sleep apnea    BIPAP OR CPAP-PT UNSURE WHICH ONE         Past Surgical History:  Procedure Laterality Date  . ANKLE SURGERY Right 2014, 2015   x3  . INGUINAL HERNIA REPAIR Bilateral    childhood  . TONSILLECTOMY    . UMBILICAL HERNIA REPAIR N/A 02/25/2016   Procedure: HERNIA REPAIR UMBILICAL ADULT;  Surgeon: Kieth BrightlySeeplaputhur G Sankar, MD;  Location: [facility] ORS;  Service: General;  Laterality: N/A;         Family History  Problem Relation Age of Onset  . Heart disease Mother   . Alcohol abuse Father   . Heart disease Maternal Aunt   . Heart disease Maternal Grandmother   . COPD Maternal Grandmother   . Diabetes Maternal Grandfather   . Cancer Neg Hx   . Hypertension Neg Hx   . Stroke Neg Hx      Social History Social History        Tobacco Use  . Smoking status: Never Smoker  . Smokeless tobacco: Never Used  Substance Use Topics  . Alcohol use: Yes    Comment: OCC  . Drug use: No          Allergies  Allergen Reactions  . Amlodipine Other (See Comments)    Edema   . Penicillins Rash          Current Outpatient  Medications  Medication Sig Dispense Refill  . benazepril (LOTENSIN) 40 MG tablet Take 1 tablet (40 mg total) by mouth daily. 30 tablet 1  . levocetirizine (XYZAL) 5 MG tablet Take 5 mg by mouth every evening.   6  . meloxicam (MOBIC) 7.5 MG tablet Take by mouth.     . montelukast (SINGULAIR) 10 MG tablet Take 1 tablet (10 mg total) by mouth at bedtime. 90 tablet 3  . predniSONE (DELTASONE) 10 MG tablet Taking 5 a day for 5 days (Patient not taking: Reported on 07/20/2017) 25 tablet 0   No current facility-administered medications for this visit.       REVIEW OF SYSTEMS (Negative unless checked)  Constitutional: [] Weight loss  [] Fever  [] Chills Cardiac: [] Chest pain   [] Chest pressure   [] Palpitations   [] Shortness of breath when laying flat   [] Shortness of breath at rest   [] Shortness of breath with exertion. Vascular:  [] Pain in legs with walking   [] Pain in legs at rest   [] Pain in legs when laying flat   [] Claudication   [] Pain in feet when walking  [] Pain in feet at rest  [] Pain in  feet when laying flat   [] History of DVT   [] Phlebitis   [x] Swelling in legs   [x] Varicose veins   [x] Non-healing ulcers Pulmonary:   [] Uses home oxygen   [] Productive cough   [] Hemoptysis   [] Wheeze  [] COPD   [] Asthma Neurologic:  [] Dizziness  [] Blackouts   [] Seizures   [] History of stroke   [] History of TIA  [] Aphasia   [] Temporary blindness   [] Dysphagia   [] Weakness or numbness in arms   [] Weakness or numbness in legs Musculoskeletal:  [x] Arthritis   [] Joint swelling   [] Joint pain   [] Low back pain Hematologic:  [] Easy bruising  [] Easy bleeding   [] Hypercoagulable state   [] Anemic  [] Hepatitis Gastrointestinal:  [] Blood in stool   [] Vomiting blood  [] Gastroesophageal reflux/heartburn   [] Abdominal pain Genitourinary:  [] Chronic kidney disease   [] Difficult urination  [] Frequent urination  [] Burning with urination   [] Hematuria Skin:  [] Rashes   [x] Ulcers   [x] Wounds Psychological:  [] History of  anxiety   []  History of major depression.     Physical Examination  BP 126/71 (BP Location: Right Arm)   Pulse 60   Resp 17   Ht 6\' 5"  (1.956 m)   Wt (!) 143.1 kg (315 lb 6.4 oz)   BMI 37.40 kg/m  Gen:  WD/WN, NAD Head: [place]/AT, No temporalis wasting. Ear/Nose/Throat: Hearing grossly intact, nares w/o erythema or drainage, trachea midline Eyes: Conjunctiva clear. Sclera non-icteric Neck: Supple.  No JVD.  Pulmonary:  Good air movement, no use of accessory muscles.  Cardiac: RRR, normal S1, S2 Vascular:  Vessel Right Left  Radial Palpable Palpable                                   Gastrointestinal: soft, non-tender/non-distended. No guarding/reflex.  Musculoskeletal: M/S 5/5 throughout.  No deformity or atrophy.  1+ bilateral lower extremity edema. Neurologic: Sensation grossly intact in extremities.  Symmetrical.  Speech is fluent.  Psychiatric: Judgment intact, Mood & affect appropriate for pt's clinical situation. Dermatologic: Previous left ankle/calf ulceration has healed.      Labs No results found for this or any previous visit (from the past 2160 hour(s)).  Radiology     Assessment/Plan  Hypertension blood pressure control important in reducing the progression of atherosclerotic disease. On appropriate oral medications.   No problem-specific Assessment & Plan notes found for this encounter.    Festus Barren, MD  08/03/2017 9:34 AM    This note was created with Dragon medical transcription system.  Any errors from dictation are purely unintentional

## 2017-08-23 DIAGNOSIS — E669 Obesity, unspecified: Secondary | ICD-10-CM | POA: Diagnosis not present

## 2017-08-23 DIAGNOSIS — I10 Essential (primary) hypertension: Secondary | ICD-10-CM | POA: Diagnosis not present

## 2017-08-23 DIAGNOSIS — G4733 Obstructive sleep apnea (adult) (pediatric): Secondary | ICD-10-CM | POA: Diagnosis not present

## 2017-09-08 ENCOUNTER — Other Ambulatory Visit: Payer: Self-pay | Admitting: Family Medicine

## 2017-09-08 NOTE — Telephone Encounter (Signed)
Benazepril refill Last OV: 07/08/17 Last Refill:07/05/17 Pharmacy:[facility] [address]  PCP: Dr. Dossie Arbourrissman

## 2017-09-08 NOTE — Telephone Encounter (Signed)
Benazepril refill Last OV: 07/08/17 Last Refill:07/05/17 Pharmacy:[facility] [address]  PCP: Dr. Crissman  

## 2017-09-21 ENCOUNTER — Encounter (INDEPENDENT_AMBULATORY_CARE_PROVIDER_SITE_OTHER): Payer: Self-pay | Admitting: Vascular Surgery

## 2017-09-21 ENCOUNTER — Ambulatory Visit (INDEPENDENT_AMBULATORY_CARE_PROVIDER_SITE_OTHER): Payer: BLUE CROSS/BLUE SHIELD | Admitting: Vascular Surgery

## 2017-09-21 VITALS — BP 133/82 | HR 69 | Resp 16 | Ht 77.0 in | Wt 317.8 lb

## 2017-09-21 DIAGNOSIS — L97321 Non-pressure chronic ulcer of left ankle limited to breakdown of skin: Secondary | ICD-10-CM

## 2017-09-21 DIAGNOSIS — I83023 Varicose veins of left lower extremity with ulcer of ankle: Secondary | ICD-10-CM

## 2017-09-21 NOTE — Progress Notes (Signed)
Varicose veins of lower extremities with ulcer (HCC)   The patient's left lower extremity was sterilely prepped and draped. The ultrasound machine was used to visualize the saphenous vein throughout its course.  From the knee to the proximal thigh, this was either an anterior duplicate accessory saphenous system or a large branch which had enlarged over time.  This joining the great saphenous vein at the knee and the great saphenous vein in the calf remained large.  A segment in the mid calf was selected for access. The saphenous vein was accessed without difficulty using ultrasound guidance with a micro puncture needle. A micro puncture wire and sheath were then placed and this was advanced up through the anterior duplicate saphenous system up to the proximal thigh. A 0.018 wire was placed through the sheath and the micro puncture sheath was removed. The 65 cm sheath was then placed over the wire and the wire and dilator were removed. The laser fiber was placed through the sheath and its tip was placed in the proximal thigh. Tumescent anesthesia was then created with a dilute lidocaine solution. Laser energy was then delivered with constant withdrawal of the sheath and laser fiber. Approximately 1224 joules of energy were delivered over a length of 33 cm using the 1470 Hz [employer] machine at [employer]. Sterile dressings were placed. The patient tolerated the procedure well without complications.

## 2017-09-24 ENCOUNTER — Ambulatory Visit (INDEPENDENT_AMBULATORY_CARE_PROVIDER_SITE_OTHER): Payer: BLUE CROSS/BLUE SHIELD

## 2017-09-24 DIAGNOSIS — L97321 Non-pressure chronic ulcer of left ankle limited to breakdown of skin: Secondary | ICD-10-CM | POA: Diagnosis not present

## 2017-09-24 DIAGNOSIS — I83023 Varicose veins of left lower extremity with ulcer of ankle: Secondary | ICD-10-CM | POA: Diagnosis not present

## 2017-09-27 ENCOUNTER — Ambulatory Visit: Payer: BLUE CROSS/BLUE SHIELD | Admitting: Family Medicine

## 2017-09-27 ENCOUNTER — Ambulatory Visit: Payer: Self-pay | Admitting: *Deleted

## 2017-09-27 ENCOUNTER — Encounter: Payer: Self-pay | Admitting: Family Medicine

## 2017-09-27 DIAGNOSIS — K2100 Gastro-esophageal reflux disease with esophagitis, without bleeding: Secondary | ICD-10-CM

## 2017-09-27 MED ORDER — OMEPRAZOLE 20 MG PO CPDR: 20.0000 mg | DELAYED_RELEASE_CAPSULE | Freq: Every day | ORAL | 12 refills | Status: DC

## 2017-09-27 NOTE — Progress Notes (Signed)
BP (!) 148/96   Pulse 68   Ht 6\' 5"  (1.956 m)   Wt (!) 320 lb (145.2 kg)   SpO2 98%   BMI 37.95 kg/m    Subjective:    Patient ID: Jesse MoodyJohn D Wade, male    DOB: 05/13/1966, 52 y.o.   MRN: [id number]  HPI: Jesse Wade is a 52 y.o. male  Chief Complaint  Patient presents with  . Gastroesophageal Reflux   Patient 4 days ago developed marked heartburn substernal chest pressure coming on after eating a greasy meal.  Took Tums and Rolaids which eventually may be helped.  Also some Gas-X.  Had some real throat burning 3 days ago and yesterday may be a little better.  Has not taken any medicines today has some burning in his throat and also noted a little bit of hoarseness on his speech. Asked about cardiovascular symptoms and patient has none is able to be physically active with no unusual shortness of breath.  Is able to walk up and down hills without issues.  Relevant past medical, surgical, family and social history reviewed and updated as indicated. Interim medical history since our last visit reviewed. Allergies and medications reviewed and updated.  Review of Systems  Constitutional: Negative.   Respiratory: Negative.   Cardiovascular: Negative.     Per HPI unless specifically indicated above     Objective:    BP (!) 148/96   Pulse 68   Ht 6\' 5"  (1.956 m)   Wt (!) 320 lb (145.2 kg)   SpO2 98%   BMI 37.95 kg/m   Wt Readings from Last 3 Encounters:  09/27/17 (!) 320 lb (145.2 kg)  09/21/17 (!) 317 lb 12.8 oz (144.2 kg)  08/03/17 (!) 315 lb 6.4 oz (143.1 kg)    Physical Exam  Constitutional: He is oriented to person, place, and time. He appears well-developed and well-nourished.  HENT:  Head: Normocephalic and atraumatic.  Eyes: Conjunctivae and EOM are normal.  Neck: Normal range of motion.  Cardiovascular: Normal rate, regular rhythm and normal heart sounds.  Pulmonary/Chest: Effort normal and breath sounds normal.  Musculoskeletal: Normal range of motion.    Neurological: He is alert and oriented to person, place, and time.  Skin: No erythema.  Psychiatric: He has a normal mood and affect. His behavior is normal. Judgment and thought content normal.    Results for orders placed or performed in visit on 06/18/15  Lipid Panel w/o Chol/HDL Ratio  Result Value Ref Range   Cholesterol, Total 146 100 - 199 mg/dL   Triglycerides 82 0 - 149 mg/dL   HDL 40 >86>39 mg/dL   VLDL Cholesterol Cal 16 5 - 40 mg/dL   LDL Calculated 90 0 - 99 mg/dL  PSA  Result Value Ref Range   Prostate Specific Ag, Serum 0.5 0.0 - 4.0 ng/mL  Comprehensive metabolic panel  Result Value Ref Range   Glucose 97 65 - 99 mg/dL   BUN 12 6 - 24 mg/dL   Creatinine, Ser 5.780.91 0.76 - 1.27 mg/dL   GFR calc non Af Amer 99 >59 mL/min/1.73   GFR calc Af Amer 114 >59 mL/min/1.73   BUN/Creatinine Ratio 13 9 - 20   Sodium 143 134 - 144 mmol/L   Potassium 4.3 3.5 - 5.2 mmol/L   Chloride 104 96 - 106 mmol/L   CO2 24 18 - 29 mmol/L   Calcium 9.2 8.7 - 10.2 mg/dL   Total Protein 6.4 6.0 - 8.5 g/dL  Albumin 4.0 3.5 - 5.5 g/dL   Globulin, Total 2.4 1.5 - 4.5 g/dL   Albumin/Globulin Ratio 1.7 1.1 - 2.5   Bilirubin Total 0.3 0.0 - 1.2 mg/dL   Alkaline Phosphatase 85 39 - 117 IU/L   AST 22 0 - 40 IU/L   ALT 28 0 - 44 IU/L      Assessment & Plan:   Problem List Items Addressed This Visit      Digestive   Reflux esophagitis    Discussed reflux care and treatment dietary avoidance, propping up bed laying on his right side also use of Maalox and Tums etc.  Gave prescription for omeprazole.          Follow up plan: Return for As scheduled.

## 2017-09-27 NOTE — Telephone Encounter (Signed)
Pt called with complaints of "indigestion and gas pains that makes you burp a lot; he also states that he has pain/pressure below  6 inches below the middle of his sternum"; these symptoms started on 09/25/17; today he has a burning in his throat; he also states that he took gas-x and was able to burp; recommendations made per nurse triage protocol to include seeing a physician within 24 hours; pt offered and accepted appointment with Dr [facility]man, [facility], today at 1330; he verbalizes understanding; will route to office for notification of this upcoming appointment.    Reason for Disposition . [1] Patient claims chest pain is same as previously diagnosed "heartburn" AND [2] describes burning in chest AND [3] accompanying sour taste in mouth  Answer Assessment - Initial Assessment Questions 1. LOCATION: "Where does it hurt?"       6 inches under sternum 2. RADIATION: "Does the pain go anywhere else?" (e.g., into neck, jaw, arms, back)    no 3. ONSET: "When did the chest pain begin?" (Minutes, hours or days)      09/25/17 4. PATTERN "Does the pain come and go, or has it been constant since it started?"  "Does it get worse with exertion?"      intermittent 5. DURATION: "How long does it last" (e.g., seconds, minutes, hours)     hours 6. SEVERITY: "How bad is the pain?"  (e.g., Scale 1-10; mild, moderate, or severe)    - MILD (1-3): doesn't interfere with normal activities     - MODERATE (4-7): interferes with normal activities or awakens from sleep    - SEVERE (8-10): excruciating pain, unable to do any normal activities       No problems during the day; moderate at night; can't get comfortable in the bed 7. CARDIAC RISK FACTORS: "Do you have any history of heart problems or risk factors for heart disease?" (e.g., prior heart attack, angina; high blood pressure, diabetes, being overweight, high cholesterol, smoking, or strong family history of heart disease)     hypertension 8. PULMONARY  RISK FACTORS: "Do you have any history of lung disease?"  (e.g., blood clots in lung, asthma, emphysema, birth control pills)     no 9. CAUSE: "What do you think is causing the chest pain?"     unsure 10. OTHER SYMPTOMS: "Do you have any other symptoms?" (e.g., dizziness, nausea, vomiting, sweating, fever, difficulty breathing, cough)       no 11. PREGNANCY: "Is there any chance you are pregnant?" "When was your last menstrual period?"       n/a  Protocols used: CHEST PAIN-A-AH

## 2017-09-27 NOTE — Assessment & Plan Note (Signed)
Discussed reflux care and treatment dietary avoidance, propping up bed laying on his right side also use of Maalox and Tums etc.  Gave prescription for omeprazole.

## 2017-10-22 ENCOUNTER — Ambulatory Visit (INDEPENDENT_AMBULATORY_CARE_PROVIDER_SITE_OTHER): Payer: BLUE CROSS/BLUE SHIELD | Admitting: Vascular Surgery

## 2017-10-22 ENCOUNTER — Encounter (INDEPENDENT_AMBULATORY_CARE_PROVIDER_SITE_OTHER): Payer: Self-pay | Admitting: Vascular Surgery

## 2017-10-22 VITALS — BP 125/82 | HR 66 | Resp 17 | Ht 77.0 in | Wt 318.0 lb

## 2017-10-22 DIAGNOSIS — I83023 Varicose veins of left lower extremity with ulcer of ankle: Secondary | ICD-10-CM | POA: Diagnosis not present

## 2017-10-22 DIAGNOSIS — L97321 Non-pressure chronic ulcer of left ankle limited to breakdown of skin: Secondary | ICD-10-CM | POA: Diagnosis not present

## 2017-10-22 NOTE — Progress Notes (Signed)
Jesse Wade is a 52 y.o.male who presents with painful varicose veins of the right leg.  Initially, there was concern that his right great saphenous vein had recanalized and a plan for a repeat laser ablation was made.  On ultrasound today, this was not the great saphenous vein but a medial accessory saphenous vein branch that would not be amenable to laser ablation so foam sclerotherapy was selected.  Past Medical History:  Diagnosis Date  . Allergic rhinitis   . Headache    H/O MIGRAINES  . Hypertension   . Sleep apnea    BIPAP OR CPAP-PT UNSURE WHICH ONE    Past Surgical History:  Procedure Laterality Date  . ANKLE SURGERY Right 2014, 2015   x3  . INGUINAL HERNIA REPAIR Bilateral    childhood  . TONSILLECTOMY    . UMBILICAL HERNIA REPAIR N/A 02/25/2016   Procedure: HERNIA REPAIR UMBILICAL ADULT;  Surgeon: Kieth Brightly, MD;  Location: [facility] ORS;  Service: General;  Laterality: N/A;    Current Outpatient Medications  Medication Sig Dispense Refill  . benazepril (LOTENSIN) 40 MG tablet TAKE 1 TABLET BY MOUTH EVERY DAY 30 tablet 5  . levocetirizine (XYZAL) 5 MG tablet Take 5 mg by mouth every evening.   6  . meloxicam (MOBIC) 7.5 MG tablet Take by mouth.     . montelukast (SINGULAIR) 10 MG tablet Take 1 tablet (10 mg total) by mouth at bedtime. 90 tablet 3  . omeprazole (PRILOSEC) 20 MG capsule Take 1 capsule (20 mg total) by mouth daily. 30 capsule 12   No current facility-administered medications for this visit.     Allergies  Allergen Reactions  . Amlodipine Other (See Comments)    Edema   . Penicillins Rash    Indication: Patient presents with symptomatic varicose veins of the right lower extremity.  Procedure: Foam sclerotherapy was performed on the right lower extremity. Using ultrasound guidance, 10 mL of foam Sotradecol was used to inject the varicosities of the right lower extremity. Compression wraps were placed. The patient tolerated the procedure well.

## 2017-10-25 ENCOUNTER — Telehealth (INDEPENDENT_AMBULATORY_CARE_PROVIDER_SITE_OTHER): Payer: Self-pay

## 2017-10-25 ENCOUNTER — Encounter (INDEPENDENT_AMBULATORY_CARE_PROVIDER_SITE_OTHER): Payer: BLUE CROSS/BLUE SHIELD

## 2017-10-25 NOTE — Telephone Encounter (Signed)
Jesse Wade form [employer] called to see if the patient had his Right GSV laser ablation done or not? And she also needs a plan of treatment for the sclerotherapy for the Left Leg.

## 2017-12-03 ENCOUNTER — Ambulatory Visit (INDEPENDENT_AMBULATORY_CARE_PROVIDER_SITE_OTHER): Payer: BLUE CROSS/BLUE SHIELD | Admitting: Vascular Surgery

## 2017-12-24 ENCOUNTER — Ambulatory Visit (INDEPENDENT_AMBULATORY_CARE_PROVIDER_SITE_OTHER): Payer: BLUE CROSS/BLUE SHIELD | Admitting: Vascular Surgery

## 2017-12-24 ENCOUNTER — Encounter

## 2017-12-24 ENCOUNTER — Encounter (INDEPENDENT_AMBULATORY_CARE_PROVIDER_SITE_OTHER): Payer: Self-pay | Admitting: Vascular Surgery

## 2017-12-24 VITALS — BP 138/82 | HR 65 | Resp 17 | Ht 77.0 in | Wt 330.0 lb

## 2017-12-24 DIAGNOSIS — I83023 Varicose veins of left lower extremity with ulcer of ankle: Secondary | ICD-10-CM

## 2017-12-24 DIAGNOSIS — L97311 Non-pressure chronic ulcer of right ankle limited to breakdown of skin: Secondary | ICD-10-CM

## 2017-12-24 DIAGNOSIS — I83013 Varicose veins of right lower extremity with ulcer of ankle: Secondary | ICD-10-CM

## 2017-12-24 DIAGNOSIS — L97321 Non-pressure chronic ulcer of left ankle limited to breakdown of skin: Principal | ICD-10-CM

## 2017-12-24 NOTE — Progress Notes (Signed)
Jesse Wade is a 51 y.o.male who presents with painful varicose veins of the right leg  Past Medical History:  Diagnosis Date  . Allergic rhinitis   . Headache    H/O MIGRAINES  . Hypertension   . Sleep apnea    BIPAP OR CPAP-PT UNSURE WHICH ONE    Past Surgical History:  Procedure Laterality Date  . ANKLE SURGERY Right 2014, 2015   x3  . INGUINAL HERNIA REPAIR Bilateral    childhood  . TONSILLECTOMY    . UMBILICAL HERNIA REPAIR N/A 02/25/2016   Procedure: HERNIA REPAIR UMBILICAL ADULT;  Surgeon: Seeplaputhur G Sankar, MD;  Location: [facility] ORS;  Service: General;  Laterality: N/A;    Current Outpatient Medications  Medication Sig Dispense Refill  . benazepril (LOTENSIN) 40 MG tablet TAKE 1 TABLET BY MOUTH EVERY DAY 30 tablet 5  . levocetirizine (XYZAL) 5 MG tablet Take 5 mg by mouth every evening.   6  . meloxicam (MOBIC) 7.5 MG tablet Take by mouth.     . montelukast (SINGULAIR) 10 MG tablet Take 1 tablet (10 mg total) by mouth at bedtime. 90 tablet 3  . omeprazole (PRILOSEC) 20 MG capsule Take 1 capsule (20 mg total) by mouth daily. 30 capsule 12   No current facility-administered medications for this visit.     Allergies  Allergen Reactions  . Amlodipine Other (See Comments)    Edema   . Penicillins Rash    Indication: Patient presents with symptomatic varicose veins of the right lower extremity.  Procedure: Foam sclerotherapy was performed on the right lower extremity. Using ultrasound guidance, 5 mL of foam Sotradecol was used to inject the varicosities of the right lower extremity. Compression wraps were placed. The patient tolerated the procedure well. 

## 2018-02-08 ENCOUNTER — Ambulatory Visit (INDEPENDENT_AMBULATORY_CARE_PROVIDER_SITE_OTHER): Payer: BLUE CROSS/BLUE SHIELD | Admitting: Vascular Surgery

## 2018-02-08 DIAGNOSIS — I83023 Varicose veins of left lower extremity with ulcer of ankle: Secondary | ICD-10-CM

## 2018-02-08 DIAGNOSIS — I83013 Varicose veins of right lower extremity with ulcer of ankle: Secondary | ICD-10-CM | POA: Diagnosis not present

## 2018-02-08 DIAGNOSIS — L97311 Non-pressure chronic ulcer of right ankle limited to breakdown of skin: Secondary | ICD-10-CM | POA: Diagnosis not present

## 2018-02-08 DIAGNOSIS — L97321 Non-pressure chronic ulcer of left ankle limited to breakdown of skin: Principal | ICD-10-CM

## 2018-02-08 NOTE — Progress Notes (Signed)
Jesse MoodyJohn D Wade is a 52 y.o.male who presents with painful varicose veins of the right leg  Past Medical History:  Diagnosis Date  . Allergic rhinitis   . Headache    H/O MIGRAINES  . Hypertension   . Sleep apnea    BIPAP OR CPAP-PT UNSURE WHICH ONE    Past Surgical History:  Procedure Laterality Date  . ANKLE SURGERY Right 2014, 2015   x3  . INGUINAL HERNIA REPAIR Bilateral    childhood  . TONSILLECTOMY    . UMBILICAL HERNIA REPAIR N/A 02/25/2016   Procedure: HERNIA REPAIR UMBILICAL ADULT;  Surgeon: Kieth BrightlySeeplaputhur G Sankar, MD;  Location: [facility] ORS;  Service: General;  Laterality: N/A;    Current Outpatient Medications  Medication Sig Dispense Refill  . benazepril (LOTENSIN) 40 MG tablet TAKE 1 TABLET BY MOUTH EVERY DAY 30 tablet 5  . levocetirizine (XYZAL) 5 MG tablet Take 5 mg by mouth every evening.   6  . meloxicam (MOBIC) 7.5 MG tablet Take by mouth.     . montelukast (SINGULAIR) 10 MG tablet Take 1 tablet (10 mg total) by mouth at bedtime. 90 tablet 3  . omeprazole (PRILOSEC) 20 MG capsule Take 1 capsule (20 mg total) by mouth daily. 30 capsule 12   No current facility-administered medications for this visit.     Allergies  Allergen Reactions  . Amlodipine Other (See Comments)    Edema   . Penicillins Rash    Indication: Patient presents with symptomatic varicose veins of the right lower extremity.  Procedure: Foam sclerotherapy was performed on the right lower extremity. Using ultrasound guidance, 5 mL of foam Sotradecol was used to inject the varicosities of the right lower extremity. Compression wraps were placed. The patient tolerated the procedure well.

## 2018-03-10 DIAGNOSIS — J3089 Other allergic rhinitis: Secondary | ICD-10-CM | POA: Diagnosis not present

## 2018-03-10 DIAGNOSIS — J3081 Allergic rhinitis due to animal (cat) (dog) hair and dander: Secondary | ICD-10-CM | POA: Diagnosis not present

## 2018-03-11 ENCOUNTER — Other Ambulatory Visit: Payer: Self-pay | Admitting: Family Medicine

## 2018-04-12 ENCOUNTER — Ambulatory Visit (INDEPENDENT_AMBULATORY_CARE_PROVIDER_SITE_OTHER): Payer: BLUE CROSS/BLUE SHIELD | Admitting: Vascular Surgery

## 2018-09-09 ENCOUNTER — Other Ambulatory Visit: Payer: Self-pay | Admitting: Family Medicine

## 2018-09-09 NOTE — Telephone Encounter (Signed)
Pt has an appt 3/25/1/20 with PCP Requested Prescriptions  Pending Prescriptions Disp Refills  . benazepril (LOTENSIN) 40 MG tablet [Pharmacy Med Name: BENAZEPRIL HCL 40 MG TABLET] 90 tablet 0    Sig: TAKE 1 TABLET BY MOUTH EVERY DAY     Cardiovascular:  ACE Inhibitors Failed - 09/09/2018  1:47 AM      Failed - Cr in normal range and within 180 days    Creatinine, Ser  Date Value Ref Range Status  06/18/2015 0.91 0.76 - 1.27 mg/dL Final         Failed - K in normal range and within 180 days    Potassium  Date Value Ref Range Status  06/18/2015 4.3 3.5 - 5.2 mmol/L Final         Failed - Valid encounter within last 6 months    Recent Outpatient Visits          11 months ago Reflux esophagitis   [facility] Crissman, Redge Gainer, MD   1 year ago Dermatosis   [facility] Crissman, Redge Gainer, MD   1 year ago Right acute serous otitis media, recurrence not specified   [facility] [place], [place], [place]   1 year ago Venous stasis dermatitis of left lower extremity   [facility] [place], [place]   2 years ago Acute suppurative otitis media of right ear without spontaneous rupture of tympanic membrane, recurrence not specified   [facility] [place], Oralia Rud, DO      Future Appointments            In 5 days Crissman, Redge Gainer, MD [facility]           Passed - Patient is not pregnant      Passed - Last BP in normal range    BP Readings from Last 1 Encounters:  12/24/17 138/82

## 2018-09-14 ENCOUNTER — Ambulatory Visit: Payer: Self-pay | Admitting: Family Medicine

## 2018-09-22 ENCOUNTER — Encounter: Payer: Self-pay | Admitting: Family Medicine

## 2018-09-22 ENCOUNTER — Ambulatory Visit (INDEPENDENT_AMBULATORY_CARE_PROVIDER_SITE_OTHER): Payer: BLUE CROSS/BLUE SHIELD | Admitting: Family Medicine

## 2018-09-22 ENCOUNTER — Other Ambulatory Visit: Payer: Self-pay

## 2018-09-22 DIAGNOSIS — I10 Essential (primary) hypertension: Secondary | ICD-10-CM | POA: Diagnosis not present

## 2018-09-22 DIAGNOSIS — I83023 Varicose veins of left lower extremity with ulcer of ankle: Secondary | ICD-10-CM | POA: Diagnosis not present

## 2018-09-22 DIAGNOSIS — L97321 Non-pressure chronic ulcer of left ankle limited to breakdown of skin: Secondary | ICD-10-CM | POA: Diagnosis not present

## 2018-09-22 MED ORDER — BENAZEPRIL HCL 40 MG PO TABS: 40.0000 mg | ORAL_TABLET | Freq: Every day | ORAL | 1 refills | Status: DC

## 2018-09-22 NOTE — Assessment & Plan Note (Signed)
No problems with varicose veins have done well.

## 2018-09-22 NOTE — Assessment & Plan Note (Signed)
The current medical regimen is effective;  continue present plan and medications.  

## 2018-09-22 NOTE — Progress Notes (Signed)
There were no vitals taken for this visit.   Subjective:    Patient ID: Jesse Wade, male    DOB: 11-20-1965, 53 y.o.   MRN: [id number]  HPI: Jesse Wade is a 53 y.o. male  Telemedicine using audio and telecommunications for a synchronous communication visit. Today's visit due to COVID-19 isolation precautions I connected with Jesse Wade and verified that I am speaking with the correct person using two identifiers.   I discussed the limitations, risks, security and privacy concerns of performing an evaluation and management service by telephone and the availability of in person appointments. I also discussed with the patient that there may be a patient responsible charge related to this service. The patient expressed understanding and agreed to proceed. The patient's location is work. I am at home.  Patient follow-up for blood pressure medication. Blood pressures been doing well with no complaints patient all in all is been doing really well. Changes allergy medicine over to [place] and has not had any allergy symptoms this spring which is quite unusual. Uses as needed Prilosec for reflux which is very successful.  Patient has not had any further reflux symptoms. Blood pressure doing well with medications no complaints and good control. Discussed COVID-19 patient taking adequate precautions and being careful.  Relevant past medical, surgical, family and social history reviewed and updated as indicated. Interim medical history since our last visit reviewed. Allergies and medications reviewed and updated.  Review of Systems  Constitutional: Negative.   Respiratory: Negative.   Cardiovascular: Negative.     Per HPI unless specifically indicated above     Objective:    There were no vitals taken for this visit.  Wt Readings from Last 3 Encounters:  12/24/17 (!) 330 lb (149.7 kg)  10/22/17 (!) 318 lb (144.2 kg)  09/27/17 (!) 320 lb (145.2 kg)    Physical Exam  Results  for orders placed or performed in visit on 06/18/15  Lipid Panel w/o Chol/HDL Ratio  Result Value Ref Range   Cholesterol, Total 146 100 - 199 mg/dL   Triglycerides 82 0 - 149 mg/dL   HDL 40 >63 mg/dL   VLDL Cholesterol Cal 16 5 - 40 mg/dL   LDL Calculated 90 0 - 99 mg/dL  PSA  Result Value Ref Range   Prostate Specific Ag, Serum 0.5 0.0 - 4.0 ng/mL  Comprehensive metabolic panel  Result Value Ref Range   Glucose 97 65 - 99 mg/dL   BUN 12 6 - 24 mg/dL   Creatinine, Ser 1.49 0.76 - 1.27 mg/dL   GFR calc non Af Amer 99 >59 mL/min/1.73   GFR calc Af Amer 114 >59 mL/min/1.73   BUN/Creatinine Ratio 13 9 - 20   Sodium 143 134 - 144 mmol/L   Potassium 4.3 3.5 - 5.2 mmol/L   Chloride 104 96 - 106 mmol/L   CO2 24 18 - 29 mmol/L   Calcium 9.2 8.7 - 10.2 mg/dL   Total Protein 6.4 6.0 - 8.5 g/dL   Albumin 4.0 3.5 - 5.5 g/dL   Globulin, Total 2.4 1.5 - 4.5 g/dL   Albumin/Globulin Ratio 1.7 1.1 - 2.5   Bilirubin Total 0.3 0.0 - 1.2 mg/dL   Alkaline Phosphatase 85 39 - 117 IU/L   AST 22 0 - 40 IU/L   ALT 28 0 - 44 IU/L      Assessment & Plan:   Problem List Items Addressed This Visit      Cardiovascular and Mediastinum  Hypertension    The current medical regimen is effective;  continue present plan and medications.       Relevant Medications   benazepril (LOTENSIN) 40 MG tablet   Varicose veins of lower extremities with ulcer (HCC)    No problems with varicose veins have done well.      Relevant Medications   benazepril (LOTENSIN) 40 MG tablet     Other   Morbid obesity (HCC)    Discussed will continue exercise and working on good diet        I discussed the assessment and treatment plan with the patient. The patient was provided an opportunity to ask questions and all were answered. The patient agreed with the plan and demonstrated an understanding of the instructions.   The patient was advised to call back or seek an in-person evaluation if the symptoms worsen or if  the condition fails to improve as anticipated.   I provided 21+ minutes of time during this encounter.  Follow up plan: No follow-ups on file.

## 2018-09-22 NOTE — Assessment & Plan Note (Signed)
Discussed will continue exercise and working on good diet

## 2018-10-03 ENCOUNTER — Other Ambulatory Visit: Payer: Self-pay | Admitting: Family Medicine

## 2018-11-28 DIAGNOSIS — I10 Essential (primary) hypertension: Secondary | ICD-10-CM | POA: Diagnosis not present

## 2018-11-28 DIAGNOSIS — J45901 Unspecified asthma with (acute) exacerbation: Secondary | ICD-10-CM | POA: Diagnosis not present

## 2018-11-28 DIAGNOSIS — G4733 Obstructive sleep apnea (adult) (pediatric): Secondary | ICD-10-CM | POA: Diagnosis not present

## 2019-02-09 DIAGNOSIS — J3089 Other allergic rhinitis: Secondary | ICD-10-CM | POA: Diagnosis not present

## 2019-02-09 DIAGNOSIS — J3081 Allergic rhinitis due to animal (cat) (dog) hair and dander: Secondary | ICD-10-CM | POA: Diagnosis not present

## 2019-02-23 ENCOUNTER — Encounter: Payer: Self-pay | Admitting: Family Medicine

## 2019-02-23 ENCOUNTER — Other Ambulatory Visit: Payer: Self-pay

## 2019-02-23 ENCOUNTER — Ambulatory Visit (INDEPENDENT_AMBULATORY_CARE_PROVIDER_SITE_OTHER): Payer: BC Managed Care – PPO | Admitting: Family Medicine

## 2019-02-23 VITALS — Wt 316.0 lb

## 2019-02-23 DIAGNOSIS — K2100 Gastro-esophageal reflux disease with esophagitis, without bleeding: Secondary | ICD-10-CM

## 2019-02-23 DIAGNOSIS — L97321 Non-pressure chronic ulcer of left ankle limited to breakdown of skin: Secondary | ICD-10-CM

## 2019-02-23 DIAGNOSIS — I10 Essential (primary) hypertension: Secondary | ICD-10-CM

## 2019-02-23 DIAGNOSIS — I83023 Varicose veins of left lower extremity with ulcer of ankle: Secondary | ICD-10-CM | POA: Diagnosis not present

## 2019-02-23 DIAGNOSIS — G4733 Obstructive sleep apnea (adult) (pediatric): Secondary | ICD-10-CM

## 2019-02-23 DIAGNOSIS — Z0000 Encounter for general adult medical examination without abnormal findings: Secondary | ICD-10-CM

## 2019-02-23 DIAGNOSIS — Z981 Arthrodesis status: Secondary | ICD-10-CM

## 2019-02-23 MED ORDER — OMEPRAZOLE 20 MG PO CPDR: 20.0000 mg | DELAYED_RELEASE_CAPSULE | Freq: Every day | ORAL | 4 refills | Status: DC

## 2019-02-23 MED ORDER — BENAZEPRIL HCL 40 MG PO TABS: 40.0000 mg | ORAL_TABLET | Freq: Every day | ORAL | 4 refills | Status: DC

## 2019-02-23 MED ORDER — MELOXICAM 15 MG PO TABS: 15.0000 mg | ORAL_TABLET | Freq: Every day | ORAL | 3 refills | Status: DC

## 2019-02-23 NOTE — Assessment & Plan Note (Signed)
The current medical regimen is effective;  continue present plan and medications.  

## 2019-02-23 NOTE — Assessment & Plan Note (Signed)
Has had varicose vein treatment from vascular surgery

## 2019-02-23 NOTE — Assessment & Plan Note (Signed)
Doing well has lost about 30 pounds on the keto diet

## 2019-02-23 NOTE — Assessment & Plan Note (Signed)
Discussed history of ankle fusion has taken meloxicam in the past and helped wants to have that for occasional use

## 2019-02-23 NOTE — Progress Notes (Signed)
Wt (!) 316 lb (143.3 kg)   BMI 37.47 kg/m    Subjective:    Patient ID: Jesse Wade, male    DOB: [birth year], 53 y.o.   MRN: [id number]  HPI: Jesse Wade is a 53 y.o. male  Med check Patient all in all doing well except for his ankle which she has had fused and limited range of motion will occasionally hurt is taken meloxicam in the past which helped wants refill on that. Blood pressure doing well with no complaints. Reflux stable. Varicose veins doing well has had treatment. Sleep apnea doing well Patient especially proud is lost weight with keto diet.   Relevant past medical, surgical, family and social history reviewed and updated as indicated. Interim medical history since our last visit reviewed. Allergies and medications reviewed and updated.  Review of Systems  Constitutional: Negative.   Respiratory: Negative.   Cardiovascular: Negative.     Per HPI unless specifically indicated above     Objective:    Wt (!) 316 lb (143.3 kg)   BMI 37.47 kg/m   Wt Readings from Last 3 Encounters:  02/23/19 (!) 316 lb (143.3 kg)  12/24/17 (!) 330 lb (149.7 kg)  10/22/17 (!) 318 lb (144.2 kg)    Physical Exam  Results for orders placed or performed in visit on 06/18/15  Lipid Panel w/o Chol/HDL Ratio  Result Value Ref Range   Cholesterol, Total 146 100 - 199 mg/dL   Triglycerides 82 0 - 149 mg/dL   HDL 40 >40>39 mg/dL   VLDL Cholesterol Cal 16 5 - 40 mg/dL   LDL Calculated 90 0 - 99 mg/dL  PSA  Result Value Ref Range   Prostate Specific Ag, Serum 0.5 0.0 - 4.0 ng/mL  Comprehensive metabolic panel  Result Value Ref Range   Glucose 97 65 - 99 mg/dL   BUN 12 6 - 24 mg/dL   Creatinine, Ser 9.810.91 0.76 - 1.27 mg/dL   GFR calc non Af Amer 99 >59 mL/min/1.73   GFR calc Af Amer 114 >59 mL/min/1.73   BUN/Creatinine Ratio 13 9 - 20   Sodium 143 134 - 144 mmol/L   Potassium 4.3 3.5 - 5.2 mmol/L   Chloride 104 96 - 106 mmol/L   CO2 24 18 - 29 mmol/L   Calcium 9.2 8.7 -  10.2 mg/dL   Total Protein 6.4 6.0 - 8.5 g/dL   Albumin 4.0 3.5 - 5.5 g/dL   Globulin, Total 2.4 1.5 - 4.5 g/dL   Albumin/Globulin Ratio 1.7 1.1 - 2.5   Bilirubin Total 0.3 0.0 - 1.2 mg/dL   Alkaline Phosphatase 85 39 - 117 IU/L   AST 22 0 - 40 IU/L   ALT 28 0 - 44 IU/L      Assessment & Plan:   Problem List Items Addressed This Visit      Cardiovascular and Mediastinum   Hypertension    The current medical regimen is effective;  continue present plan and medications.       Varicose veins of lower extremities with ulcer (HCC)    Has had varicose vein treatment from vascular surgery        Respiratory   OSA treated with BiPAP    Uses his BiPAP faithfully with no problems no daytime drowsiness.        Digestive   Reflux esophagitis    The current medical regimen is effective;  continue present plan and medications.  Other   Morbid obesity ([place])    Doing well has lost about 30 pounds on the keto diet      H/O ankle fusion    Discussed history of ankle fusion has taken meloxicam in the past and helped wants to have that for occasional use       Other Visit Diagnoses    PE (physical exam), annual    -  Primary     Telemedicine using audio/video telecommunications for a synchronous communication visit. Today's visit due to COVID-19 isolation precautions I connected with and verified that I am speaking with the correct person using two identifiers.   I discussed the limitations, risks, security and privacy concerns of performing an evaluation and management service by telecommunication and the availability of in person appointments. I also discussed with the patient that there may be a patient responsible charge related to this service. The patient expressed understanding and agreed to proceed. The patient's location is work. I am at home.   I discussed the assessment and treatment plan with the patient. The patient was provided an opportunity to ask  questions and all were answered. The patient agreed with the plan and demonstrated an understanding of the instructions.   The patient was advised to call back or seek an in-person evaluation if the symptoms worsen or if the condition fails to improve as anticipated.   I provided 21+ minutes of time during this encounter.  Follow up plan: Return for Physical Exam, soon.

## 2019-02-23 NOTE — Assessment & Plan Note (Signed)
Uses his BiPAP faithfully with no problems no daytime drowsiness.

## 2019-03-14 ENCOUNTER — Other Ambulatory Visit: Payer: Self-pay

## 2019-03-14 ENCOUNTER — Ambulatory Visit: Payer: Self-pay

## 2019-03-14 VITALS — BP 138/88 | HR 63 | Resp 16

## 2019-03-14 DIAGNOSIS — Z008 Encounter for other general examination: Secondary | ICD-10-CM

## 2019-03-14 LAB — GLUCOSE, POCT (MANUAL RESULT ENTRY): POC Glucose: 106 mg/dl — AB (ref 70–99)

## 2019-03-14 LAB — POCT LIPID PANEL
HDL: 48
LDL: 57
Non-HDL: 68
TC/HDL: 2.4
TC: 116
TRG: 55

## 2019-03-14 NOTE — Progress Notes (Signed)
     Patient ID: Jesse Wade, male    DOB: [birth year], 53 y.o.   MRN: [id number]    Thank you!!  Apolonio Schneiders RN  [place] Nurse Specialist [place]: [phone]  Cell:  [phone] Website: [email or web address]

## 2019-03-26 ENCOUNTER — Other Ambulatory Visit: Payer: Self-pay | Admitting: Family Medicine

## 2019-03-26 DIAGNOSIS — K2100 Gastro-esophageal reflux disease with esophagitis, without bleeding: Secondary | ICD-10-CM

## 2019-03-27 ENCOUNTER — Encounter: Payer: BC Managed Care – PPO | Admitting: Family Medicine

## 2019-10-26 ENCOUNTER — Ambulatory Visit: Payer: BC Managed Care – PPO | Admitting: Family Medicine

## 2019-11-22 DIAGNOSIS — E669 Obesity, unspecified: Secondary | ICD-10-CM | POA: Diagnosis not present

## 2019-11-22 DIAGNOSIS — G4733 Obstructive sleep apnea (adult) (pediatric): Secondary | ICD-10-CM | POA: Diagnosis not present

## 2020-01-10 ENCOUNTER — Ambulatory Visit: Payer: Self-pay

## 2020-01-10 NOTE — Telephone Encounter (Signed)
Patient called and states that he has been feeling dizzy and woozy since the beginning of this week.  He states that he has noticed that his face becomes flush.  He works in environment that is not air conditioned but states his office is air conditioned. He has started a multvitamin  Centrum but has not changed any medications. He has been drinking extra water and trying to diet.  He states that he drinks 100 oz of water at work. He has no nausea or vomiting. And rates the lightheadedness as mild. From sitting to standing makes it worse. Per protocol patient was scheduled for OV tomorrow. Care advice read to patient. He verbalized understanding. Reason for Disposition  [1] MILD dizziness (e.g., walking normally) AND [2] has NOT been evaluated by physician for this  (Exception: dizziness caused by heat exposure, sudden standing, or poor fluid intake)  Answer Assessment - Initial Assessment Questions 1. DESCRIPTION: "Describe your dizziness."     Light headed 2. LIGHTHEADED: "Do you feel lightheaded?" (e.g., somewhat faint, woozy, weak upon standing)     Woozy but not faint 3. VERTIGO: "Do you feel like either you or the room is spinning or tilting?" (i.e. vertigo)     no 4. SEVERITY: "How bad is it?"  "Do you feel like you are going to faint?" "Can you stand and walk?"   - MILD: Feels slightly dizzy, but walking normally.   - MODERATE: Feels very unsteady when walking, but not falling; interferes with normal activities (e.g., school, work) .   - SEVERE: Unable to walk without falling, or requires assistance to walk without falling; feels like passing out now.      Mils able to walk and work 5. ONSET:  "When did the dizziness begin?"    First of this week 6. AGGRAVATING FACTORS: "Does anything make it worse?" (e.g., standing, change in head position)    Change from sitting to standing 7. HEART RATE: "Can you tell me your heart rate?" "How many beats in 15 seconds?"  (Note: not all patients can do  this)     Unknown does not feel it is fast 8. CAUSE: "What do you think is causing the dizziness?"    Nothing drinking 100oz of water per day trying to diet 9. RECURRENT SYMPTOM: "Have you had dizziness before?" If Yes, ask: "When was the last time?" "What happened that time?"     no 10. OTHER SYMPTOMS: "Do you have any other symptoms?" (e.g., fever, chest pain, vomiting, diarrhea, bleeding)      Flush faced no fever 11. PREGNANCY: "Is there any chance you are pregnant?" "When was your last menstrual period?"     N/A  Protocols used: DIZZINESS [facility]

## 2020-01-11 ENCOUNTER — Encounter: Payer: Self-pay | Admitting: Family Medicine

## 2020-01-11 ENCOUNTER — Other Ambulatory Visit: Payer: Self-pay

## 2020-01-11 ENCOUNTER — Ambulatory Visit: Payer: BC Managed Care – PPO | Admitting: Family Medicine

## 2020-01-11 VITALS — BP 148/89 | HR 56 | Temp 98.3°F | Ht 77.0 in | Wt 336.0 lb

## 2020-01-11 DIAGNOSIS — I10 Essential (primary) hypertension: Secondary | ICD-10-CM | POA: Diagnosis not present

## 2020-01-11 DIAGNOSIS — R42 Dizziness and giddiness: Secondary | ICD-10-CM | POA: Diagnosis not present

## 2020-01-11 MED ORDER — HYDROCHLOROTHIAZIDE 12.5 MG PO TABS: 12.5000 mg | ORAL_TABLET | Freq: Every day | ORAL | 0 refills | Status: DC

## 2020-01-11 NOTE — Progress Notes (Signed)
BP (!) 148/89   Pulse 56   Temp 98.3 F (36.8 C) (Oral)   Ht 6\' 5"  (1.956 m)   Wt (!) 336 lb (152.4 kg)   SpO2 98%   BMI 39.84 kg/m    Subjective:    Patient ID: , male    DOB: Sep 16, 1965, 54 y.o.   MRN: [id number]  HPI: Jesse Wade is a 54 y.o. male  Chief Complaint  Patient presents with  . Dizziness    off and on for 2-3 days   Feeling woozy and lightheaded intermittently the past 2-3 days. Episodes lasting 5-10 minutes. Able to walk and function fully during episodes, states he just mildly feels funny during these episodes. Denies any associated sxs, including CP, SOB, N/V, room spinning sensation. Denies med changes, dietary or activity changes, sick contacts. Not trying anything for sxs.   Relevant past medical, surgical, family and social history reviewed and updated as indicated. Interim medical history since our last visit reviewed. Allergies and medications reviewed and updated.  Review of Systems  Per HPI unless specifically indicated above     Objective:    BP (!) 148/89   Pulse 56   Temp 98.3 F (36.8 C) (Oral)   Ht 6\' 5"  (1.956 m)   Wt (!) 336 lb (152.4 kg)   SpO2 98%   BMI 39.84 kg/m   Wt Readings from Last 3 Encounters:  01/11/20 (!) 336 lb (152.4 kg)  02/23/19 (!) 316 lb (143.3 kg)  12/24/17 (!) 330 lb (149.7 kg)    Physical Exam Vitals and nursing note reviewed.  Constitutional:      Appearance: Normal appearance.  HENT:     Head: Atraumatic.     Right Ear: Tympanic membrane normal.     Left Ear: Tympanic membrane normal.     Nose: Nose normal.     Mouth/Throat:     Mouth: Mucous membranes are moist.     Pharynx: Oropharynx is clear.  Eyes:     Extraocular Movements: Extraocular movements intact.     Conjunctiva/sclera: Conjunctivae normal.  Cardiovascular:     Rate and Rhythm: Normal rate and regular rhythm.  Pulmonary:     Effort: Pulmonary effort is normal.     Breath sounds: Normal breath sounds.    Musculoskeletal:        General: Normal range of motion.     Cervical back: Normal range of motion and neck supple.  Skin:    General: Skin is warm and dry.  Neurological:     General: No focal deficit present.     Mental Status: He is oriented to person, place, and time.  Psychiatric:        Mood and Affect: Mood normal.        Thought Content: Thought content normal.        Judgment: Judgment normal.     Results for orders placed or performed in visit on 03/14/19  POCT Glucose (CBG)-Manual entry (CPT [phone])  Result Value Ref Range   POC Glucose 106 (A) 70 - 99 mg/dl  POCT Lipid Panel  Result Value Ref Range   TC 116    HDL 48    TRG 55    LDL 57    Non-HDL 68    TC/HDL 2.4       Assessment & Plan:   Problem List Items Addressed This Visit      Cardiovascular and Mediastinum   Hypertension    BPs  elevated on multiple rechecks - add HCTZ to benazepril regimen and monitor home readings. DASH diet, exercise, stress control. Recheck 2 weeks      Relevant Medications   hydrochlorothiazide (HYDRODIURIL) 12.5 MG tablet    Other Visit Diagnoses    Dizziness    -  Primary   EKG without acute abnormalities, exam and orthostatic VSs benign. Possibly related to elevated BPs. Rest, hydrate and start HCTZ. F/u 2 weeks   Relevant Orders   EKG 12-Lead (Completed)     30 minutes spent today in direct patient care and counseling with patient   Follow up plan: Return in about 2 weeks (around 01/25/2020) for BP recheck.

## 2020-01-14 NOTE — Assessment & Plan Note (Signed)
BPs elevated on multiple rechecks - add HCTZ to benazepril regimen and monitor home readings. DASH diet, exercise, stress control. Recheck 2 weeks

## 2020-01-25 ENCOUNTER — Other Ambulatory Visit: Payer: Self-pay

## 2020-01-25 ENCOUNTER — Ambulatory Visit: Payer: BC Managed Care – PPO | Admitting: Family Medicine

## 2020-01-25 ENCOUNTER — Encounter: Payer: Self-pay | Admitting: Family Medicine

## 2020-01-25 VITALS — BP 122/75 | HR 63 | Temp 98.4°F | Wt 336.0 lb

## 2020-01-25 DIAGNOSIS — I10 Essential (primary) hypertension: Secondary | ICD-10-CM

## 2020-01-25 MED ORDER — HYDROCHLOROTHIAZIDE 12.5 MG PO TABS: 12.5000 mg | ORAL_TABLET | Freq: Every day | ORAL | 1 refills | Status: DC

## 2020-01-25 NOTE — Progress Notes (Signed)
BP 122/75   Pulse 63   Temp 98.4 F (36.9 C) (Oral)   Wt (!) 336 lb (152.4 kg)   SpO2 97%   BMI 39.84 kg/m    Subjective:    Patient ID: Jesse Wade, male    DOB: June 19, 1966, 54 y.o.   MRN: [id number]  HPI: Jesse Wade is a 54 y.o. male  Chief Complaint  Patient presents with  . Hypertension   Patient presenting today for 2 week BP f/u after adding HCTZ to benazepril regimen. Tolerating well without side effects. States home BPs around 120s/80s range. Denies CP, SOB, HAs, syncope. Working on improving diet and exercising more additionally.   Relevant past medical, surgical, family and social history reviewed and updated as indicated. Interim medical history since our last visit reviewed. Allergies and medications reviewed and updated.  Review of Systems  Per HPI unless specifically indicated above     Objective:    BP 122/75   Pulse 63   Temp 98.4 F (36.9 C) (Oral)   Wt (!) 336 lb (152.4 kg)   SpO2 97%   BMI 39.84 kg/m   Wt Readings from Last 3 Encounters:  01/25/20 (!) 336 lb (152.4 kg)  01/11/20 (!) 336 lb (152.4 kg)  02/23/19 (!) 316 lb (143.3 kg)    Physical Exam Vitals and nursing note reviewed.  Constitutional:      Appearance: Normal appearance.  HENT:     Head: Atraumatic.  Eyes:     Extraocular Movements: Extraocular movements intact.     Conjunctiva/sclera: Conjunctivae normal.  Cardiovascular:     Rate and Rhythm: Normal rate and regular rhythm.  Pulmonary:     Effort: Pulmonary effort is normal.     Breath sounds: Normal breath sounds.  Musculoskeletal:        General: Normal range of motion.     Cervical back: Normal range of motion and neck supple.  Skin:    General: Skin is warm and dry.  Neurological:     General: No focal deficit present.     Mental Status: He is oriented to person, place, and time.  Psychiatric:        Mood and Affect: Mood normal.        Thought Content: Thought content normal.        Judgment: Judgment  normal.     Results for orders placed or performed in visit on 01/25/20  Comprehensive metabolic panel  Result Value Ref Range   Glucose 94 65 - 99 mg/dL   BUN 28 (H) 6 - 24 mg/dL   Creatinine, Ser 0.21 0.76 - 1.27 mg/dL   GFR calc non Af Amer 91 >59 mL/min/1.73   GFR calc Af Amer 105 >59 mL/min/1.73   BUN/Creatinine Ratio 29 (H) 9 - 20   Sodium 142 134 - 144 mmol/L   Potassium 4.3 3.5 - 5.2 mmol/L   Chloride 105 96 - 106 mmol/L   CO2 25 20 - 29 mmol/L   Calcium 9.1 8.7 - 10.2 mg/dL   Total Protein 6.7 6.0 - 8.5 g/dL   Albumin 4.5 3.8 - 4.9 g/dL   Globulin, Total 2.2 1.5 - 4.5 g/dL   Albumin/Globulin Ratio 2.0 1.2 - 2.2   Bilirubin Total 0.4 0.0 - 1.2 mg/dL   Alkaline Phosphatase 74 48 - 121 IU/L   AST 22 0 - 40 IU/L   ALT 27 0 - 44 IU/L      Assessment & Plan:   Problem  List Items Addressed This Visit      Cardiovascular and Mediastinum   Hypertension - Primary    BPs improved and stable, WNL. Continue benazepril and HCTZ regimen, recheck bmp today      Relevant Medications   hydrochlorothiazide (HYDRODIURIL) 12.5 MG tablet   Other Relevant Orders   Comprehensive metabolic panel (Completed)       Follow up plan: Return for CPE whenever able, 6 month HTN f/u.

## 2020-01-26 LAB — COMPREHENSIVE METABOLIC PANEL
ALT: 27 IU/L (ref 0–44)
AST: 22 IU/L (ref 0–40)
Albumin/Globulin Ratio: 2 (ref 1.2–2.2)
Albumin: 4.5 g/dL (ref 3.8–4.9)
Alkaline Phosphatase: 74 IU/L (ref 48–121)
BUN/Creatinine Ratio: 29 — ABNORMAL HIGH (ref 9–20)
BUN: 28 mg/dL — ABNORMAL HIGH (ref 6–24)
Bilirubin Total: 0.4 mg/dL (ref 0.0–1.2)
CO2: 25 mmol/L (ref 20–29)
Calcium: 9.1 mg/dL (ref 8.7–10.2)
Chloride: 105 mmol/L (ref 96–106)
Creatinine, Ser: 0.95 mg/dL (ref 0.76–1.27)
GFR calc Af Amer: 105 mL/min/{1.73_m2} (ref >59)
GFR calc non Af Amer: 91 mL/min/{1.73_m2} (ref >59)
Globulin, Total: 2.2 g/dL (ref 1.5–4.5)
Glucose: 94 mg/dL (ref 65–99)
Potassium: 4.3 mmol/L (ref 3.5–5.2)
Sodium: 142 mmol/L (ref 134–144)
Total Protein: 6.7 g/dL (ref 6.0–8.5)

## 2020-01-26 NOTE — Assessment & Plan Note (Signed)
BPs improved and stable, WNL. Continue benazepril and HCTZ regimen, recheck bmp today

## 2020-02-01 DIAGNOSIS — J3089 Other allergic rhinitis: Secondary | ICD-10-CM | POA: Diagnosis not present

## 2020-02-01 DIAGNOSIS — J3081 Allergic rhinitis due to animal (cat) (dog) hair and dander: Secondary | ICD-10-CM | POA: Diagnosis not present

## 2020-02-14 ENCOUNTER — Ambulatory Visit: Payer: Self-pay
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
## 2020-02-16 ENCOUNTER — Ambulatory Visit: Payer: BC Managed Care – PPO | Admitting: Nurse Practitioner

## 2020-02-16 ENCOUNTER — Other Ambulatory Visit: Payer: Self-pay

## 2020-02-16 ENCOUNTER — Encounter: Payer: Self-pay | Admitting: Nurse Practitioner

## 2020-02-16 VITALS — BP 126/73 | HR 61 | Temp 98.1°F | Wt 334.0 lb

## 2020-02-16 DIAGNOSIS — M542 Cervicalgia: Secondary | ICD-10-CM | POA: Insufficient documentation

## 2020-02-16 DIAGNOSIS — H6982 Other specified disorders of Eustachian tube, left ear: Secondary | ICD-10-CM | POA: Diagnosis not present

## 2020-02-16 DIAGNOSIS — T07XXXA Unspecified multiple injuries, initial encounter: Secondary | ICD-10-CM | POA: Diagnosis not present

## 2020-02-16 DIAGNOSIS — H6992 Unspecified Eustachian tube disorder, left ear: Secondary | ICD-10-CM | POA: Insufficient documentation

## 2020-02-16 MED ORDER — PREDNISONE 20 MG PO TABS
40.0000 mg | ORAL_TABLET | Freq: Every day | ORAL | 0 refills | Status: AC
Start: 2020-02-16 — End: 2020-02-21

## 2020-02-16 MED ORDER — TIZANIDINE HCL 4 MG PO TABS
4.0000 mg | ORAL_TABLET | Freq: Four times a day (QID) | ORAL | 0 refills | Status: DC | PRN
Start: 2020-02-16 — End: 2021-06-04
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
## 2020-02-16 NOTE — Patient Instructions (Signed)
Eustachian Tube Dysfunction ° °Eustachian tube dysfunction refers to a condition in which a blockage develops in the narrow passage that connects the middle ear to the back of the nose (eustachian tube). The eustachian tube regulates air pressure in the middle ear by letting air move between the ear and nose. It also helps to drain fluid from the middle ear space. °Eustachian tube dysfunction can affect one or both ears. When the eustachian tube does not function properly, air pressure, fluid, or both can build up in the middle ear. °What are the causes? °This condition occurs when the eustachian tube becomes blocked or cannot open normally. Common causes of this condition include: °· Ear infections. °· Colds and other infections that affect the nose, mouth, and throat (upper respiratory tract). °· Allergies. °· Irritation from cigarette smoke. °· Irritation from stomach acid coming up into the esophagus (gastroesophageal reflux). The esophagus is the tube that carries food from the mouth to the stomach. °· Sudden changes in air pressure, such as from descending in an airplane or scuba diving. °· Abnormal growths in the nose or throat, such as: °? Growths that line the nose (nasal polyps). °? Abnormal growth of cells (tumors). °? Enlarged tissue at the back of the throat (adenoids). °What increases the risk? °You are more likely to develop this condition if: °· You smoke. °· You are overweight. °· You are a child who has: °? Certain birth defects of the mouth, such as cleft palate. °? Large tonsils or adenoids. °What are the signs or symptoms? °Common symptoms of this condition include: °· A feeling of fullness in the ear. °· Ear pain. °· Clicking or popping noises in the ear. °· Ringing in the ear. °· Hearing loss. °· Loss of balance. °· Dizziness. °Symptoms may get worse when the air pressure around you changes, such as when you travel to an area of high elevation, fly on an airplane, or go scuba diving. °How is  this diagnosed? °This condition may be diagnosed based on: °· Your symptoms. °· A physical exam of your ears, nose, and throat. °· Tests, such as those that measure: °? The movement of your eardrum (tympanogram). °? Your hearing (audiometry). °How is this treated? °Treatment depends on the cause and severity of your condition. °· In mild cases, you may relieve your symptoms by moving air into your ears. This is called "popping the ears." °· In more severe cases, or if you have symptoms of fluid in your ears, treatment may include: °? Medicines to relieve congestion (decongestants). °? Medicines that treat allergies (antihistamines). °? Nasal sprays or ear drops that contain medicines that reduce swelling (steroids). °? A procedure to drain the fluid in your eardrum (myringotomy). In this procedure, a small tube is placed in the eardrum to: °§ Drain the fluid. °§ Restore the air in the middle ear space. °? A procedure to insert a balloon device through the nose to inflate the opening of the eustachian tube (balloon dilation). °Follow these instructions at home: °Lifestyle °· Do not do any of the following until your health care provider approves: °? Travel to high altitudes. °? Fly in airplanes. °? Work in a pressurized cabin or room. °? Scuba dive. °· Do not use any products that contain nicotine or tobacco, such as cigarettes and e-cigarettes. If you need help quitting, ask your health care provider. °· Keep your ears dry. Wear fitted earplugs during showering and bathing. Dry your ears completely after. °General instructions °· Take over-the-counter   and prescription medicines only as told by your health care provider. °· Use techniques to help pop your ears as recommended by your health care provider. These may include: °? Chewing gum. °? Yawning. °? Frequent, forceful swallowing. °? Closing your mouth, holding your nose closed, and gently blowing as if you are trying to blow air out of your nose. °· Keep all  follow-up visits as told by your health care provider. This is important. °Contact a health care provider if: °· Your symptoms do not go away after treatment. °· Your symptoms come back after treatment. °· You are unable to pop your ears. °· You have: °? A fever. °? Pain in your ear. °? Pain in your head or neck. °? Fluid draining from your ear. °· Your hearing suddenly changes. °· You become very dizzy. °· You lose your balance. °Summary °· Eustachian tube dysfunction refers to a condition in which a blockage develops in the eustachian tube. °· It can be caused by ear infections, allergies, inhaled irritants, or abnormal growths in the nose or throat. °· Symptoms include ear pain, hearing loss, or ringing in the ears. °· Mild cases are treated with maneuvers to unblock the ears, such as yawning or ear popping. °· Severe cases are treated with medicines. Surgery may also be done (rare). °This information is not intended to replace advice given to you by your health care provider. Make sure you discuss any questions you have with your health care provider. °Document Revised: 09/28/2017 Document Reviewed: 09/28/2017 °Elsevier Patient Education © 2020 [employer] ° °
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
## 2020-03-04 ENCOUNTER — Other Ambulatory Visit: Payer: Self-pay | Admitting: Nurse Practitioner

## 2020-03-04 DIAGNOSIS — Z981 Arthrodesis status: Secondary | ICD-10-CM

## 2020-03-04 MED ORDER — MELOXICAM 15 MG PO TABS: 15.0000 mg | ORAL_TABLET | Freq: Every day | ORAL | 3 refills | Status: DC

## 2020-04-01 ENCOUNTER — Other Ambulatory Visit: Payer: Self-pay

## 2020-04-01 DIAGNOSIS — I10 Essential (primary) hypertension: Secondary | ICD-10-CM

## 2020-04-01 DIAGNOSIS — K2100 Gastro-esophageal reflux disease with esophagitis, without bleeding: Secondary | ICD-10-CM

## 2020-04-01 MED ORDER — BENAZEPRIL HCL 40 MG PO TABS: 40.0000 mg | ORAL_TABLET | Freq: Every day | ORAL | 4 refills | Status: DC

## 2020-04-01 MED ORDER — OMEPRAZOLE 20 MG PO CPDR: DELAYED_RELEASE_CAPSULE | ORAL | 4 refills | Status: DC

## 2020-04-01 NOTE — Telephone Encounter (Signed)
Patient last seen 02/16/20

## 2020-08-06 ENCOUNTER — Other Ambulatory Visit: Payer: Self-pay | Admitting: Family Medicine

## 2020-11-22 NOTE — Progress Notes (Signed)
[facility] [address] [place], [place] [address]  Pulmonary Sleep Medicine   Office Visit Note  Patient Name: JAYVION STEFANSKI DOB: Jun 19, 1966 MRN [id number]    Chief Complaint: Obstructive Sleep Apnea visit  Brief History:  Emmauel is seen today for initial consult on BIPAP 22/17cmh20 . Patient has a 7 year history of sleep apnea. Patient is using BIPAP nightly. The patient feels well rested after sleeping with BIPAP.  The patient reports benefiting from BIPAP use. Reported sleepiness is improved during the day and the Epworth Sleepiness Score is 2 out of 24. The patient does not take naps. Prior to BIPAP use patient reports excessive daytime fatigue and sleepiness. His wife witnessed apneic events and reported loud snoring.The patient complains of the following: The patient's machine is over 1 years old and due to hours of use and extensive travel it is recommended that the patient obtain a replacement machine.  The  download shows 88% compliance with an average use time of 6:35 hours. The AHI is 2.7  The patient does not  Complain of limb movements disrupting sleep.  ROS  General: (-) fever, (-) chills, (-) night sweat Nose and Sinuses: (-) nasal stuffiness or itchiness, (-) postnasal drip, (-) nosebleeds, (-) sinus trouble. Mouth and Throat: (-) sore throat, (-) hoarseness. Neck: (-) swollen glands, (-) enlarged thyroid, (-) neck pain. Respiratory: - cough, - shortness of breath, - wheezing. Neurologic: - numbness, - tingling. Psychiatric: - anxiety, - depression   Current Medication: Outpatient Encounter Medications as of 11/25/2020  Medication Sig Note  . benazepril (LOTENSIN) 40 MG tablet Take 1 tablet (40 mg total) by mouth daily.   . hydrochlorothiazide (HYDRODIURIL) 12.5 MG tablet Take 1 tablet (12.5 mg total) by mouth daily.   Marland Kitchen levocetirizine (XYZAL) 5 MG tablet Take 5 mg by mouth every evening.  06/18/2015: Received from: External Pharmacy Received Sig: 1  TABLET IN THE EVENING ONCE A DAY ORALLY 30 DAYS  . meloxicam (MOBIC) 15 MG tablet Take 1 tablet (15 mg total) by mouth daily.   . montelukast (SINGULAIR) 10 MG tablet Take 1 tablet (10 mg total) by mouth at bedtime.   Marland Kitchen omeprazole (PRILOSEC) 20 MG capsule TAKE 1 CAPSULE(20 MG) BY MOUTH DAILY   . tiZANidine (ZANAFLEX) 4 MG tablet Take 1 tablet (4 mg total) by mouth every 6 (six) hours as needed for muscle spasms.    No facility-administered encounter medications on file as of 11/25/2020.    Surgical History: Past Surgical History:  Procedure Laterality Date  . ANKLE SURGERY Right 2014, 2015   x3  . INGUINAL HERNIA REPAIR Bilateral    childhood  . TONSILLECTOMY    . UMBILICAL HERNIA REPAIR N/A 02/25/2016   Procedure: HERNIA REPAIR UMBILICAL ADULT;  Surgeon: Kieth Brightly, MD;  Location: [facility] ORS;  Service: General;  Laterality: N/A;    Medical History: Past Medical History:  Diagnosis Date  . Allergic rhinitis   . Headache    H/O MIGRAINES  . Hypertension   . Sleep apnea    BIPAP OR CPAP-PT UNSURE WHICH ONE    Family History: Non contributory to the present illness  Social History: Social History   Socioeconomic History  . Marital status: Married    Spouse name: Not on file  . Number of children: Not on file  . Years of education: Not on file  . Highest education level: Not on file  Occupational History  . Not on file  Tobacco Use  .  Smoking status: Never Smoker  . Smokeless tobacco: Never Used  Vaping Use  . Vaping Use: Never used  Substance and Sexual Activity  . Alcohol use: Yes    Comment: OCC  . Drug use: No  . Sexual activity: Not on file  Other Topics Concern  . Not on file  Social History Narrative  . Not on file   Social Determinants of Health   Financial Resource Strain: Not on file  Food Insecurity: Not on file  Transportation Needs: Not on file  Physical Activity: Not on file  Stress: Not on file  Social Connections: Not on file   Intimate Partner Violence: Not on file    Vital Signs: Blood pressure 138/80, pulse (!) 58, resp. rate 18, height 6\' 6"  (1.981 m), weight (!) 337 lb (152.9 kg), SpO2 98 %.  Examination: General Appearance: The patient is well-developed, well-nourished, and in no distress. Neck Circumference: 48 Skin: Gross inspection of skin unremarkable. Head: normocephalic, no gross deformities. Eyes: no gross deformities noted. ENT: ears appear grossly normal Neurologic: Alert and oriented. No involuntary movements.    EPWORTH SLEEPINESS SCALE:  Scale:  (0)= no chance of dozing; (1)= slight chance of dozing; (2)= moderate chance of dozing; (3)= high chance of dozing  Chance  Situtation    Sitting and reading: 0    Watching TV: 1    Sitting Inactive in public: 0    As a passenger in car: 0      Lying down to rest: 1    Sitting and talking: 0    Sitting quielty after lunch: 0    In a car, stopped in traffic: 0   TOTAL SCORE:   2 out of 24    SLEEP STUDIES:  1. Split 09/22/13 - AHI 101.3,  Low SpO2  81%,  recommend BiPAP titration   CPAP COMPLIANCE DATA:  Date Range: 11/27/19 - 11/25/20  Average Daily Use: 6:35 hours  Median Use: 7.o hours  Compliance for > 4 Hours: 88% days  AHI: 2.7 respiratory events per hour  Days Used: 322/365 days  Mask Leak: 0.1 lpm  95th Percentile Pressure: 21/17 cmH2O         LABS: No results found for this or any previous visit (from the past 2160 hour(s)).  Radiology: No results found.  No results found.  No results found.    Assessment and Plan: Patient Active Problem List   Diagnosis Date Noted  . Adult BMI 38.0-38.9 kg/sq m 11/25/2020  . CPAP use counseling 11/25/2020  . Abrasions of multiple sites 02/16/2020  . Eustachian tube dysfunction, left 02/16/2020  . Neck pain 02/16/2020  . H/O ankle fusion 02/23/2019  . Reflux esophagitis 09/27/2017  . Varicose veins of lower extremities with ulcer (HCC) 07/20/2017   . Chronic non-seasonal allergic rhinitis 04/29/2016  . Umbilical hernia 02/04/2016  . Morbid obesity (HCC) 06/22/2015  . OSA treated with BiPAP 06/22/2015  . Hypertension     1. OSA treated with BiPAP The patient does tolerate PAP and reports definite benefit from PAP use. Machine has reached end of life and must  Be replaced. The patient was reminded how to clean equipment and advised to replace supplies routinely. . The patient was also counselled on weight loss. The compliance is very good. . The AHI is 2.7. OSA- continue with very good compliance, replace machine, f/u 30d after set up.     2. CPAP use counseling CPAP Counseling: had a lengthy discussion with the patient regarding  the importance of PAP therapy in management of the sleep apnea. Patient appears to understand the risk factor reduction and also understands the risks associated with untreated sleep apnea. Patient will try to make a good faith effort to remain compliant with therapy. Also instructed the patient on proper cleaning of the device including the water must be changed daily if possible and use of distilled water is preferred. Patient understands that the machine should be regularly cleaned with appropriate recommended cleaning solutions that do not damage the PAP machine for example given white vinegar and water rinses. Other methods such as ozone treatment may not be as good as these simple methods to achieve cleaning.  3. Primary hypertension Hypertension Counseling:   The following hypertensive lifestyle modification were recommended and discussed:  1. Limiting alcohol intake to less than 1 oz/day of ethanol:(24 oz of beer or 8 oz of wine or 2 oz of 100-proof whiskey). 2. Take baby ASA 81 mg daily. 3. Importance of regular aerobic exercise and losing weight. 4. Reduce dietary saturated fat and cholesterol intake for overall cardiovascular health. 5. Maintaining adequate dietary potassium, calcium, and magnesium  intake. 6. Regular monitoring of the blood pressure. 7. Reduce sodium intake to less than 100 mmol/day (less than 2.3 gm of sodium or less than 6 gm of sodium choride)   4. BMI 38.0-38.9 obesity Obesity Counseling: Had a lengthy discussion regarding patients BMI and weight issues. Patient was instructed on portion control as well as increased activity. Also discussed caloric restrictions with trying to maintain intake less than 2000 Kcal. Discussions were made in accordance with the 5As of weight management. Simple actions such as not eating late and if able to, taking a walk is suggested.  General Counseling: I have discussed the findings of the evaluation and examination with Rivaan.  I have also discussed any further diagnostic evaluation thatmay be needed or ordered today. Greg verbalizes understanding of the findings of todays visit. We also reviewed his medications today and discussed drug interactions and side effects including but not limited excessive drowsiness and altered mental states. We also discussed that there is always a risk not just to him but also people around him. he has been encouraged to call the office with any questions or concerns that should arise related to todays visit.  No orders of the defined types were placed in this encounter.       I have personally obtained a history, examined the patient, evaluated laboratory and imaging results, formulated the assessment and plan and placed orders.   This patient was seen today by Emmaline Kluver, PA-C in collaboration with Dr. Freda Munro.    Yevonne Pax, MD [facility] Diplomate ABMS Pulmonary and Critical Care Medicine Sleep medicine

## 2020-11-25 ENCOUNTER — Ambulatory Visit (INDEPENDENT_AMBULATORY_CARE_PROVIDER_SITE_OTHER): Payer: BC Managed Care – PPO | Admitting: Internal Medicine

## 2020-11-25 VITALS — BP 138/80 | HR 58 | Resp 18 | Ht 78.0 in | Wt 337.0 lb

## 2020-11-25 DIAGNOSIS — G4733 Obstructive sleep apnea (adult) (pediatric): Secondary | ICD-10-CM

## 2020-11-25 DIAGNOSIS — Z6838 Body mass index (BMI) 38.0-38.9, adult: Secondary | ICD-10-CM | POA: Diagnosis not present

## 2020-11-25 DIAGNOSIS — Z7189 Other specified counseling: Secondary | ICD-10-CM | POA: Insufficient documentation

## 2020-11-25 DIAGNOSIS — I10 Essential (primary) hypertension: Secondary | ICD-10-CM

## 2020-11-25 NOTE — Patient Instructions (Signed)

## 2021-01-28 DIAGNOSIS — J3081 Allergic rhinitis due to animal (cat) (dog) hair and dander: Secondary | ICD-10-CM | POA: Diagnosis not present

## 2021-01-28 DIAGNOSIS — J3089 Other allergic rhinitis: Secondary | ICD-10-CM | POA: Diagnosis not present

## 2021-03-04 ENCOUNTER — Other Ambulatory Visit: Payer: Self-pay | Admitting: Nurse Practitioner

## 2021-03-04 DIAGNOSIS — Z981 Arthrodesis status: Secondary | ICD-10-CM

## 2021-03-04 NOTE — Telephone Encounter (Signed)
Requested medications are due for refill today.  yes  Requested medications are on the active medications list.  yes  Last refill. 03/04/2020  Future visit scheduled.   no  Notes to clinic.  Pt has not been seen for ankle fusion since 02/23/2019.

## 2021-04-08 ENCOUNTER — Other Ambulatory Visit: Payer: Self-pay | Admitting: Nurse Practitioner

## 2021-04-08 DIAGNOSIS — K2100 Gastro-esophageal reflux disease with esophagitis, without bleeding: Secondary | ICD-10-CM

## 2021-04-08 DIAGNOSIS — I10 Essential (primary) hypertension: Secondary | ICD-10-CM

## 2021-04-08 NOTE — Telephone Encounter (Signed)
Requested medications are due for refill today.  yes  Requested medications are on the active medications list.  yes  Last refill. Benazepril 04/01/2020, Omeprazole 04/01/2020  Future visit scheduled.   no  Notes to clinic.  Pt last seen in office for HTN 01/25/2020, for [facility] 02/23/2019.

## 2021-05-03 ENCOUNTER — Other Ambulatory Visit: Payer: Self-pay | Admitting: Nurse Practitioner

## 2021-05-03 DIAGNOSIS — K2100 Gastro-esophageal reflux disease with esophagitis, without bleeding: Secondary | ICD-10-CM

## 2021-05-03 DIAGNOSIS — I10 Essential (primary) hypertension: Secondary | ICD-10-CM

## 2021-05-04 NOTE — Telephone Encounter (Signed)
Requested medication (s) are due for refill today: Yes  Requested medication (s) are on the active medication list: Yes  Last refill:  04/09/21  Future visit scheduled: No  Notes to clinic:  Unable to refill per protocol, appointment needed. Patient will call back to schedule. Courtesy refill given.      Requested Prescriptions  Pending Prescriptions Disp Refills   omeprazole (PRILOSEC) 20 MG capsule [Pharmacy Med Name: OMEPRAZOLE DR 20 MG CAPSULE] 30 capsule 0    Sig: TAKE 1 CAPSULE BY MOUTH ONCE DAILY     Gastroenterology: Proton Pump Inhibitors Failed - 05/03/2021  2:34 PM      Failed - Valid encounter within last 12 months    Recent Outpatient Visits           1 year ago Neck pain   [facility] [place], Corrie Dandy T, NP   1 year ago Essential hypertension   [facility] [place], [place]   1 year ago Dizziness   [facility] [place], [place]   2 years ago PE (physical exam), annual   [facility] [place], Redge Gainer, MD   2 years ago Essential hypertension   [facility] [place], Redge Gainer, MD               benazepril (LOTENSIN) 40 MG tablet [Pharmacy Med Name: BENAZEPRIL HCL 40 MG TABLET] 30 tablet 0    Sig: TAKE 1 TABLET BY MOUTH EVERY DAY     Cardiovascular:  ACE Inhibitors Failed - 05/03/2021  2:34 PM      Failed - Cr in normal range and within 180 days    Creatinine, Ser  Date Value Ref Range Status  01/25/2020 0.95 0.76 - 1.27 mg/dL Final          Failed - K in normal range and within 180 days    Potassium  Date Value Ref Range Status  01/25/2020 4.3 3.5 - 5.2 mmol/L Final          Failed - Valid encounter within last 6 months    Recent Outpatient Visits           1 year ago Neck pain   [facility] [place], Corrie Dandy T, NP   1 year ago Essential hypertension   [facility] [place], [place]   1 year ago Dizziness   [facility] [place], [place], [place]   2 years ago PE (physical exam), annual   [facility] [place], Redge Gainer, MD   2 years ago Essential hypertension   [facility] [place], Redge Gainer, MD              Passed - Patient is not pregnant      Passed - Last BP in normal range    BP Readings from Last 1 Encounters:  11/25/20 138/80

## 2021-05-04 NOTE — Telephone Encounter (Signed)
Patient called and advised he will need an annual physical with Dr. Charlotta Newton in order to receive medication refills. He says he will have to call back on Monday once he checks his schedule, he says he has enough medication at this time.

## 2021-05-05 NOTE — Telephone Encounter (Signed)
noted 

## 2021-05-30 ENCOUNTER — Other Ambulatory Visit: Payer: Self-pay | Admitting: Internal Medicine

## 2021-05-30 DIAGNOSIS — I10 Essential (primary) hypertension: Secondary | ICD-10-CM

## 2021-05-30 DIAGNOSIS — K2100 Gastro-esophageal reflux disease with esophagitis, without bleeding: Secondary | ICD-10-CM

## 2021-05-30 NOTE — Telephone Encounter (Signed)
Requested medication (s) are due for refill today: no  Requested medication (s) are on the active medication list: yes  Last refill:  05/05/21 #30/0RF  Future visit scheduled: No  Notes to clinic:  Unable to refill per protocol, appointment needed. Unable to refill per protocol due to failed labs, no updated results.      Requested Prescriptions  Pending Prescriptions Disp Refills   benazepril (LOTENSIN) 40 MG tablet [Pharmacy Med Name: BENAZEPRIL HCL 40 MG TABLET] 30 tablet 0    Sig: TAKE 1 TABLET BY MOUTH EVERY DAY     Cardiovascular:  ACE Inhibitors Failed - 05/30/2021  9:34 AM      Failed - Cr in normal range and within 180 days    Creatinine, Ser  Date Value Ref Range Status  01/25/2020 0.95 0.76 - 1.27 mg/dL Final          Failed - K in normal range and within 180 days    Potassium  Date Value Ref Range Status  01/25/2020 4.3 3.5 - 5.2 mmol/L Final          Failed - Valid encounter within last 6 months    Recent Outpatient Visits           1 year ago Neck pain   [facility] [place], Corrie Dandy T, NP   1 year ago Essential hypertension   [facility] [place], [place]   1 year ago Dizziness   [facility] [place], Sal[place], [place]   2 years ago PE (physical exam), annual   [address] [facility] [place], Redge Gainer, MD   2 years ago Essential hypertension   [facility] [place], Redge Gainer, MD              Passed - Patient is not pregnant      Passed - Last BP in normal range    BP Readings from Last 1 Encounters:  11/25/20 138/80           omeprazole (PRILOSEC) 20 MG capsule [Pharmacy Med Name: OMEPRAZOLE DR 20 MG CAPSULE] 30 capsule 0    Sig: TAKE 1 CAPSULE BY MOUTH ONCE DAILY     Gastroenterology: Proton Pump Inhibitors Failed - 05/30/2021  9:34 AM      Failed - Valid encounter within last 12 months    Recent Outpatient Visits           1 year ago Neck pain   [facility]  Aura Dials T, NP   1 year ago Essential hypertension   [facility] [place], [place]   1 year ago Dizziness   [facility] [place], [place], [place]   2 years ago PE (physical exam), annual   [address] [facility] [place], Redge Gainer, MD   2 years ago Essential hypertension   [facility] [place], Redge Gainer, MD

## 2021-06-02 NOTE — Telephone Encounter (Signed)
Pt scheduled for this wednesday

## 2021-06-04 ENCOUNTER — Ambulatory Visit: Payer: BC Managed Care – PPO | Admitting: Internal Medicine

## 2021-06-04 ENCOUNTER — Encounter: Payer: Self-pay | Admitting: Internal Medicine

## 2021-06-04 ENCOUNTER — Other Ambulatory Visit: Payer: Self-pay

## 2021-06-04 VITALS — BP 136/76 | HR 57 | Temp 97.9°F | Ht 77.95 in | Wt 331.1 lb

## 2021-06-04 DIAGNOSIS — Z7189 Other specified counseling: Secondary | ICD-10-CM

## 2021-06-04 DIAGNOSIS — I10 Essential (primary) hypertension: Secondary | ICD-10-CM
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
## 2021-06-05 LAB — URINALYSIS, ROUTINE W REFLEX MICROSCOPIC
Bilirubin, UA: NEGATIVE
Glucose, UA: NEGATIVE
Ketones, UA: NEGATIVE
Leukocytes,UA: NEGATIVE
Nitrite, UA: NEGATIVE
Protein,UA: NEGATIVE
RBC, UA: NEGATIVE
Specific Gravity, UA: 1.012 (ref 1.005–1.030)
Urobilinogen, Ur: 0.2 mg/dL (ref 0.2–1.0)
pH, UA: 6.5 (ref 5.0–7.5)

## 2021-06-05 LAB — COMPREHENSIVE METABOLIC PANEL
ALT: 20 IU/L (ref 0–44)
AST: 18 IU/L (ref 0–40)
Albumin/Globulin Ratio: 2.1 (ref 1.2–2.2)
Albumin: 4.4 g/dL (ref 3.8–4.9)
Alkaline Phosphatase: 89 IU/L (ref 44–121)
BUN/Creatinine Ratio: 21 — ABNORMAL HIGH (ref 9–20)
BUN: 19 mg/dL (ref 6–24)
Bilirubin Total: 0.4 mg/dL (ref 0.0–1.2)
CO2: 24 mmol/L (ref 20–29)
Calcium: 9 mg/dL (ref 8.7–10.2)
Chloride: 106 mmol/L (ref 96–106)
Creatinine, Ser: 0.9 mg/dL (ref 0.76–1.27)
Globulin, Total: 2.1 g/dL (ref 1.5–4.5)
Glucose: 98 mg/dL (ref 70–99)
Potassium: 4.4 mmol/L (ref 3.5–5.2)
Sodium: 142 mmol/L (ref 134–144)
Total Protein: 6.5 g/dL (ref 6.0–8.5)
eGFR: 101 mL/min/{1.73_m2} (ref >59)

## 2021-06-05 LAB — CBC WITH DIFFERENTIAL/PLATELET
Basophils Absolute: 0 10*3/uL (ref 0.0–0.2)
Basos: 1 %
EOS (ABSOLUTE): 0.2 10*3/uL (ref 0.0–0.4)
Eos: 4 %
Hematocrit: 44.6 % (ref 37.5–51.0)
Hemoglobin: 15.4 g/dL (ref 13.0–17.7)
Immature Grans (Abs): 0 10*3/uL (ref 0.0–0.1)
Immature Granulocytes: 0 %
Lymphocytes Absolute: 1.4 10*3/uL (ref 0.7–3.1)
Lymphs: 29 %
MCH: 30.3 pg (ref 26.6–33.0)
MCHC: 34.5 g/dL (ref 31.5–35.7)
MCV: 88 fL (ref 79–97)
Monocytes Absolute: 0.5 10*3/uL (ref 0.1–0.9)
Monocytes: 11 %
Neutrophils Absolute: 2.7 10*3/uL (ref 1.4–7.0)
Neutrophils: 55 %
Platelets: 207 10*3/uL (ref 150–450)
RBC: 5.08 x10E6/uL (ref 4.14–5.80)
RDW: 11.8 % (ref 11.6–15.4)
WBC: 4.8 10*3/uL (ref 3.4–10.8)

## 2021-06-05 LAB — LIPID PANEL
Chol/HDL Ratio: 2.9 ratio (ref 0.0–5.0)
Cholesterol, Total: 139 mg/dL (ref 100–199)
HDL: 48 mg/dL (ref >39)
LDL Chol Calc (NIH): 80 mg/dL (ref 0–99)
Triglycerides: 49 mg/dL (ref 0–149)
VLDL Cholesterol Cal: 11 mg/dL (ref 5–40)

## 2021-06-05 LAB — TSH: TSH: 2.03 u[IU]/mL (ref 0.450–4.500)

## 2021-06-06 DIAGNOSIS — M79672 Pain in left foot: Secondary | ICD-10-CM | POA: Insufficient documentation

## 2021-06-06 DIAGNOSIS — M19079 Primary osteoarthritis, unspecified ankle and foot: Secondary | ICD-10-CM | POA: Insufficient documentation

## 2021-06-06 DIAGNOSIS — M2142 Flat foot [pes planus] (acquired), left foot: Secondary | ICD-10-CM | POA: Diagnosis not present

## 2021-06-06 DIAGNOSIS — M722 Plantar fascial fibromatosis: Secondary | ICD-10-CM | POA: Insufficient documentation

## 2021-06-06 DIAGNOSIS — M19072 Primary osteoarthritis, left ankle and foot: Secondary | ICD-10-CM | POA: Diagnosis not present

## 2021-06-26 ENCOUNTER — Ambulatory Visit: Payer: BC Managed Care – PPO | Admitting: Nurse Practitioner

## 2021-06-26 ENCOUNTER — Other Ambulatory Visit: Payer: Self-pay

## 2021-06-26 ENCOUNTER — Encounter: Payer: Self-pay | Admitting: Nurse Practitioner

## 2021-06-26 VITALS — BP 128/72 | HR 61 | Temp 98.2°F | Ht 78.0 in | Wt 337.0 lb

## 2021-06-26 DIAGNOSIS — J3081 Allergic rhinitis due to animal (cat) (dog) hair and dander: Secondary | ICD-10-CM | POA: Insufficient documentation

## 2021-06-26 DIAGNOSIS — H6982 Other specified disorders of Eustachian tube, left ear: Secondary | ICD-10-CM

## 2021-06-26 MED ORDER — PREDNISONE 10 MG PO TABS: ORAL_TABLET | ORAL | 0 refills | Status: DC

## 2021-06-26 NOTE — Progress Notes (Signed)
Acute Office Visit  Subjective:    Patient ID: Jesse Wade, male    DOB: [birth year], 56 y.o.   MRN: [id number]  Chief Complaint  Patient presents with   Sinus Problem    Patient states Saturday his L ear stopped up as if there was some pressure in the ear. Patient states the next day the pressure eased up back up. Patient states last night his L ear was stopped up as he has fluid and this time it caused some dizziness. Patient denies having any drainage. Patient states when he takes a deep breath it feels like his ear is filling up.     HPI Patient is in today for left ear pain and pressure for 5 days.  EAR CLOGGED  Duration: days Involved ear(s): yes left Sensation of feeling clogged/plugged: yes Decreased/muffled hearing:yes Ear pain: no Fever: no Otorrhea: no Hearing loss: no Upper respiratory infection symptoms: no Using Q-Tips:  occasionally Status: fluctuating History of cerumenosis: no Treatments attempted: none   Past Medical History:  Diagnosis Date   Allergic rhinitis    Headache    H/O MIGRAINES   Hypertension    Sleep apnea    BIPAP OR CPAP-PT UNSURE WHICH ONE    Past Surgical History:  Procedure Laterality Date   ANKLE SURGERY Right 2014, 2015   x3   INGUINAL HERNIA REPAIR Bilateral    childhood   TONSILLECTOMY     UMBILICAL HERNIA REPAIR N/A 02/25/2016   Procedure: HERNIA REPAIR UMBILICAL ADULT;  Surgeon: Christene Lye, MD;  Location: [facility] OR[facility];  Service: General;  Laterality: N/A;    Family History  Problem Relation Age of Onset   Heart disease Mother    Alcohol abuse Father    Heart disease Maternal Aunt    Heart disease Maternal Grandmother    COPD Maternal Grandmother    Diabetes Maternal Grandfather    Cancer Neg Hx    Hypertension Neg Hx    Stroke Neg Hx     Social History   Socioeconomic History   Marital status: Married    Spouse name: Not on file   Number of children: Not on file   Years of education: Not on file    Highest education level: Not on file  Occupational History   Not on file  Tobacco Use   Smoking status: Never   Smokeless tobacco: Never  Vaping Use   Vaping Use: Never used  Substance and Sexual Activity   Alcohol use: Yes    Comment: OCC   Drug use: No   Sexual activity: Not on file  Other Topics Concern   Not on file  Social History Narrative   Not on file   Social Determinants of Health   Financial Resource Strain: Not on file  Food Insecurity: Not on file  Transportation Needs: Not on file  Physical Activity: Not on file  Stress: Not on file  Social Connections: Not on file  Intimate Partner Violence: Not on file    Outpatient Medications Prior to Visit  Medication Sig Dispense Refill   benazepril (LOTENSIN) 40 MG tablet TAKE 1 TABLET BY MOUTH EVERY DAY 30 tablet 0   diclofenac (VOLTAREN) 75 MG EC tablet Take 75 mg by mouth 2 (two) times daily.     levocetirizine (XYZAL) 5 MG tablet Take 5 mg by mouth every evening.   6   montelukast (SINGULAIR) 10 MG tablet Take 1 tablet (10 mg total) by mouth at bedtime. 90 tablet  3   omeprazole (PRILOSEC) 20 MG capsule TAKE 1 CAPSULE BY MOUTH ONCE DAILY 30 capsule 0   triamcinolone (NASACORT) 55 MCG/ACT AERO nasal inhaler 1 spray in each nostril     meloxicam (MOBIC) 15 MG tablet Take 1 tablet (15 mg total) by mouth daily. (Patient not taking: Reported on 06/26/2021) 90 tablet 3   No facility-administered medications prior to visit.    Allergies  Allergen Reactions   Amlodipine Other (See Comments)    Edema    Penicillins Rash    Review of Systems  Constitutional: Negative.   HENT:  Positive for ear pain (clogged, muffled). Negative for congestion, rhinorrhea, sinus pressure and sore throat.   Respiratory: Negative.    Cardiovascular: Negative.   Genitourinary: Negative.   Skin: Negative.   Neurological:  Positive for dizziness. Negative for headaches.      Objective:    Physical Exam Vitals and nursing note  reviewed.  Constitutional:      Appearance: Normal appearance.  HENT:     Head: Normocephalic.     Right Ear: Tympanic membrane, ear canal and external ear normal.     Left Ear: Ear canal and external ear normal. A middle ear effusion is present. Tympanic membrane is not erythematous.  Eyes:     Conjunctiva/sclera: Conjunctivae normal.  Cardiovascular:     Rate and Rhythm: Normal rate and regular rhythm.     Pulses: Normal pulses.     Heart sounds: Normal heart sounds.  Pulmonary:     Effort: Pulmonary effort is normal.     Breath sounds: Normal breath sounds.  Musculoskeletal:     Cervical back: Normal range of motion.  Skin:    General: Skin is warm.  Neurological:     General: No focal deficit present.     Mental Status: He is alert and oriented to person, place, and time.  Psychiatric:        Mood and Affect: Mood normal.        Behavior: Behavior normal.        Thought Content: Thought content normal.        Judgment: Judgment normal.    BP 128/72 (BP Location: Left Arm, Cuff Size: Large)    Pulse 61    Temp 98.2 F (36.8 C) (Oral)    Ht '6\' 6"'  (1.981 m)    Wt (!) 337 lb (152.9 kg)    SpO2 97%    BMI 38.94 kg/m  Wt Readings from Last 3 Encounters:  06/26/21 (!) 337 lb (152.9 kg)  06/04/21 (!) 331 lb 1.6 oz (150.2 kg)  11/25/20 (!) 337 lb (152.9 kg)    Health Maintenance Due  Topic Date Due   HIV Screening  Never done   Hepatitis C Screening  Never done   COLONOSCOPY (Pts 45-15yr Insurance coverage will need to be confirmed)  Never done   Zoster Vaccines- Shingrix (1 of 2) Never done   TETANUS/TDAP  06/22/2018   INFLUENZA VACCINE  Never done   COVID-19 Vaccine (2 - Booster for Janssen series) 03/25/2021    There are no preventive care reminders to display for this patient.   Lab Results  Component Value Date   TSH 2.030 06/04/2021   Lab Results  Component Value Date   WBC 4.8 06/04/2021   HGB 15.4 06/04/2021   HCT 44.6 06/04/2021   MCV 88 06/04/2021    PLT 207 06/04/2021   Lab Results  Component Value Date   NA 142 06/04/2021  K 4.4 06/04/2021   CO2 24 06/04/2021   GLUCOSE 98 06/04/2021   BUN 19 06/04/2021   CREATININE 0.90 06/04/2021   BILITOT 0.4 06/04/2021   ALKPHOS 89 06/04/2021   AST 18 06/04/2021   ALT 20 06/04/2021   PROT 6.5 06/04/2021   ALBUMIN 4.4 06/04/2021   CALCIUM 9.0 06/04/2021   EGFR 101 06/04/2021   Lab Results  Component Value Date   CHOL 139 06/04/2021   Lab Results  Component Value Date   HDL 48 06/04/2021   Lab Results  Component Value Date   LDLCALC 80 06/04/2021   Lab Results  Component Value Date   TRIG 49 06/04/2021   Lab Results  Component Value Date   CHOLHDL 2.9 06/04/2021   No results found for: HGBA1C     Assessment & Plan:   Problem List Items Addressed This Visit   None Visit Diagnoses     Dysfunction of left eustachian tube    -  Primary   Continue xyzal, nasacort, and singulair. Start prednsione taper. F/U if symptoms not improving or worsening.         Meds ordered this encounter  Medications   predniSONE (DELTASONE) 10 MG tablet    Sig: Take 6 tablets today, then 5 tablets tomorrow, then decrease by 1 tablet every day until gone    Dispense:  21 tablet    Refill:  0     Charyl Dancer, NP

## 2021-06-29 ENCOUNTER — Other Ambulatory Visit: Payer: Self-pay | Admitting: Internal Medicine

## 2021-06-29 DIAGNOSIS — I10 Essential (primary) hypertension: Secondary | ICD-10-CM

## 2021-06-29 DIAGNOSIS — K2100 Gastro-esophageal reflux disease with esophagitis, without bleeding: Secondary | ICD-10-CM

## 2021-06-29 NOTE — Telephone Encounter (Signed)
Requested Prescriptions  Pending Prescriptions Disp Refills   benazepril (LOTENSIN) 40 MG tablet [Pharmacy Med Name: BENAZEPRIL HCL 40 MG TABLET] 90 tablet 1    Sig: TAKE 1 TABLET BY MOUTH EVERY DAY     Cardiovascular:  ACE Inhibitors Passed - 06/29/2021 10:32 AM      Passed - Cr in normal range and within 180 days    Creatinine, Ser  Date Value Ref Range Status  06/04/2021 0.90 0.76 - 1.27 mg/dL Final         Passed - K in normal range and within 180 days    Potassium  Date Value Ref Range Status  06/04/2021 4.4 3.5 - 5.2 mmol/L Final         Passed - Patient is not pregnant      Passed - Last BP in normal range    BP Readings from Last 1 Encounters:  06/26/21 128/72         Passed - Valid encounter within last 6 months    Recent Outpatient Visits          3 days ago Dysfunction of left eustachian tube   [facility] McElwee, Lauren A, NP   3 weeks ago Primary hypertension   [facility] Vigg, Avanti, MD   1 year ago Neck pain   [place], [place] T, NP   1 year ago Essential hypertension   [place], Rachel [place], [place]   1 year ago [place], Lilia Argue, [place]      Future Appointments            In 5 months Vigg, Avanti, MD [facility], [facility]            omeprazole (PRILOSEC) 20 MG capsule [Pharmacy Med Name: OMEPRAZOLE DR 20 MG CAPSULE] 90 capsule 1    Sig: TAKE 1 CAPSULE BY MOUTH ONCE DAILY     Gastroenterology: Proton Pump Inhibitors Passed - 06/29/2021 10:32 AM      Passed - Valid encounter within last 12 months    Recent Outpatient Visits          3 days ago Dysfunction of left eustachian tube   [facility] McElwee, Lauren A, NP   3 weeks ago Primary hypertension   [facility] Vigg, Avanti, MD   1 year ago Neck pain   [place], [place] T, NP   1 year ago Essential hypertension    [facility] [place], [place]   1 year ago [place], Lilia Argue, [place]      Future Appointments            In 5 months Vigg, Avanti, MD [facility], [place]

## 2021-10-14 ENCOUNTER — Other Ambulatory Visit: Payer: Self-pay | Admitting: Internal Medicine

## 2021-10-14 DIAGNOSIS — Z981 Arthrodesis status: Secondary | ICD-10-CM

## 2021-10-14 NOTE — Telephone Encounter (Signed)
Medication Refill - Medication:  ?meloxicam (MOBIC) 15 MG tablet ? ?Has the patient contacted their pharmacy? Yes.   ?Contact PCP ? ?Preferred Pharmacy (with phone number or street name):  ?[id number] - [place], [place] - [address]  ?[address], [place] [address]  ?Phone:  [phone]  Fax:  [phone]  ? ?Has the patient been seen for an appointment in the last year OR does the patient have an upcoming appointment? Yes.   ? ?Agent: Please be advised that RX refills may take up to 3 business days. We ask that you follow-up with your pharmacy. ?

## 2021-10-15 NOTE — Telephone Encounter (Signed)
Pt called to check status of refill request for Meloxicam / please advise  ?

## 2021-10-16 MED ORDER — MELOXICAM 15 MG PO TABS: 15.0000 mg | ORAL_TABLET | Freq: Every day | ORAL | 3 refills | Status: DC

## 2021-10-16 NOTE — Telephone Encounter (Signed)
Requested medication (s) are due for refill today: Yes ? ?Requested medication (s) are on the active medication list: Yes ? ?Last refill:  03/04/20 ? ?Future visit scheduled: Yes ? ?Notes to clinic:  Prescription expired. ? ? ? ?Requested Prescriptions  ?Pending Prescriptions Disp Refills  ? meloxicam (MOBIC) 15 MG tablet 90 tablet 3  ?  Sig: Take 1 tablet (15 mg total) by mouth daily.  ?  ? Analgesics:  COX2 Inhibitors Failed - 10/15/2021  1:36 PM  ?  ?  Failed - Manual Review: Labs are only required if the patient has taken medication for more than 8 weeks.  ?  ?  Passed - HGB in normal range and within 360 days  ?  Hemoglobin  ?Date Value Ref Range Status  ?06/04/2021 15.4 13.0 - 17.7 g/dL Final  ?  ?  ?  ?  Passed - Cr in normal range and within 360 days  ?  Creatinine, Ser  ?Date Value Ref Range Status  ?06/04/2021 0.90 0.76 - 1.27 mg/dL Final  ?  ?  ?  ?  Passed - HCT in normal range and within 360 days  ?  Hematocrit  ?Date Value Ref Range Status  ?06/04/2021 44.6 37.5 - 51.0 % Final  ?  ?  ?  ?  Passed - AST in normal range and within 360 days  ?  AST  ?Date Value Ref Range Status  ?06/04/2021 18 0 - 40 IU/L Final  ?  ?  ?  ?  Passed - ALT in normal range and within 360 days  ?  ALT  ?Date Value Ref Range Status  ?06/04/2021 20 0 - 44 IU/L Final  ?  ?  ?  ?  Passed - eGFR is 30 or above and within 360 days  ?  GFR calc Af Amer  ?Date Value Ref Range Status  ?01/25/2020 105 >59 mL/min/1.73 Final  ?  Comment:  ?  **[facility] currently reports eGFR in compliance with the current** ?  recommendations of the [employer]. [facility] will ?  update reporting as new guidelines are published from the NKF-ASN ?  Task force. ?  ? ?GFR calc non Af Amer  ?Date Value Ref Range Status  ?01/25/2020 91 >59 mL/min/1.73 Final  ? ?eGFR  ?Date Value Ref Range Status  ?06/04/2021 101 >59 mL/min/1.73 Final  ?  ?  ?  ?  Passed - Patient is not pregnant  ?  ?  Passed - Valid encounter within last 12 months  ?  Recent  Outpatient Visits   ? ?      ? 3 months ago Dysfunction of left eustachian tube  ? [place], Lauren A, NP  ? 4 months ago Primary hypertension  ? [facility] Vigg, Avanti, MD  ? 1 year ago Neck pain  ? [place], Henrine Screws T, NP  ? 1 year ago Essential hypertension  ? [place], [place]  ? 1 year ago Dizziness  ? [place], [place], [place]  ? ?  ?  ?Future Appointments   ? ?        ? In 1 month Vigg, Avanti, MD [facility]  ? ?  ? ? ?  ?  ?  ? ?

## 2021-10-16 NOTE — Telephone Encounter (Signed)
Requested medication (s) are due for refill today: Yes ? ?Requested medication (s) are on the active medication list: Yes ? ?Last refill:  03/04/20 ? ?Future visit scheduled: Yes ? ?Notes to clinic:  Prescription expired. ? ? ? ?Requested Prescriptions  ?Pending Prescriptions Disp Refills  ? meloxicam (MOBIC) 15 MG tablet 90 tablet 3  ?  Sig: Take 1 tablet (15 mg total) by mouth daily.  ?  ? Analgesics:  COX2 Inhibitors Failed - 10/15/2021  1:36 PM  ?  ?  Failed - Manual Review: Labs are only required if the patient has taken medication for more than 8 weeks.  ?  ?  Passed - HGB in normal range and within 360 days  ?  Hemoglobin  ?Date Value Ref Range Status  ?06/04/2021 15.4 13.0 - 17.7 g/dL Final  ?  ?  ?  ?  Passed - Cr in normal range and within 360 days  ?  Creatinine, Ser  ?Date Value Ref Range Status  ?06/04/2021 0.90 0.76 - 1.27 mg/dL Final  ?  ?  ?  ?  Passed - HCT in normal range and within 360 days  ?  Hematocrit  ?Date Value Ref Range Status  ?06/04/2021 44.6 37.5 - 51.0 % Final  ?  ?  ?  ?  Passed - AST in normal range and within 360 days  ?  AST  ?Date Value Ref Range Status  ?06/04/2021 18 0 - 40 IU/L Final  ?  ?  ?  ?  Passed - ALT in normal range and within 360 days  ?  ALT  ?Date Value Ref Range Status  ?06/04/2021 20 0 - 44 IU/L Final  ?  ?  ?  ?  Passed - eGFR is 30 or above and within 360 days  ?  GFR calc Af Amer  ?Date Value Ref Range Status  ?01/25/2020 105 >59 mL/min/1.73 Final  ?  Comment:  ?  **[facility] currently reports eGFR in compliance with the current** ?  recommendations of the [employer]. [facility] will ?  update reporting as new guidelines are published from the NKF-ASN ?  Task force. ?  ? ?GFR calc non Af Amer  ?Date Value Ref Range Status  ?01/25/2020 91 >59 mL/min/1.73 Final  ? ?eGFR  ?Date Value Ref Range Status  ?06/04/2021 101 >59 mL/min/1.73 Final  ?  ?  ?  ?  Passed - Patient is not pregnant  ?  ?  Passed - Valid encounter within last 12 months  ?  Recent  Outpatient Visits   ? ?      ? 3 months ago Dysfunction of left eustachian tube  ? [facility] McElwee, Lauren A, NP  ? 4 months ago Primary hypertension  ? [facility] Vigg, Avanti, MD  ? 1 year ago Neck pain  ? [facility] Cannady, Jolene T, NP  ? 1 year ago Essential hypertension  ? [facility] [place], Rachel Elizabeth, PA-C  ? 1 year ago Dizziness  ? [facility] Lane, Rachel Elizabeth, PA-C  ? ?  ?  ?Future Appointments   ? ?        ? In 1 month Vigg, Avanti, MD [facility], [facility]  ? ?  ? ? ?  ?  ?  ? ?

## 2021-12-03 ENCOUNTER — Ambulatory Visit: Payer: BC Managed Care – PPO | Admitting: Internal Medicine

## 2022-01-12 ENCOUNTER — Other Ambulatory Visit: Payer: Self-pay

## 2022-01-12 DIAGNOSIS — I10 Essential (primary) hypertension: Secondary | ICD-10-CM

## 2022-01-12 MED ORDER — BENAZEPRIL HCL 40 MG PO TABS: 40.0000 mg | ORAL_TABLET | Freq: Every day | ORAL | 0 refills | Status: DC

## 2022-01-12 NOTE — Telephone Encounter (Signed)
Refill request for Benazepril 40 mg  LOV 06/26/21  No up coming appt. noted

## 2022-01-13 NOTE — Telephone Encounter (Signed)
Pt states he will call back at a later time to schedule his appointment due to him being at work. When patient calls back please schedule him with Elnita Maxwell or Denny Peon for a follow up visit.

## 2022-01-26 ENCOUNTER — Other Ambulatory Visit: Payer: Self-pay

## 2022-01-26 DIAGNOSIS — K2100 Gastro-esophageal reflux disease with esophagitis, without bleeding: Secondary | ICD-10-CM

## 2022-01-26 MED ORDER — OMEPRAZOLE 20 MG PO CPDR: 20.0000 mg | DELAYED_RELEASE_CAPSULE | Freq: Every day | ORAL | 0 refills | Status: DC

## 2022-01-26 NOTE — Telephone Encounter (Signed)
LOV 06/26/21  No Future appt

## 2022-01-29 DIAGNOSIS — J3081 Allergic rhinitis due to animal (cat) (dog) hair and dander: Secondary | ICD-10-CM | POA: Diagnosis not present

## 2022-01-29 DIAGNOSIS — J3089 Other allergic rhinitis: Secondary | ICD-10-CM | POA: Diagnosis not present

## 2022-02-23 ENCOUNTER — Other Ambulatory Visit: Payer: Self-pay | Admitting: Nurse Practitioner

## 2022-02-23 DIAGNOSIS — I10 Essential (primary) hypertension: Secondary | ICD-10-CM

## 2022-02-25 NOTE — Telephone Encounter (Signed)
Requested medications are due for refill today.  yes  Requested medications are on the active medications list.  yes  Last refill. 01/12/2022 #30 0 refills  Future visit scheduled.   yes  Notes to clinic.  No PCP listed    Requested Prescriptions  Pending Prescriptions Disp Refills   benazepril (LOTENSIN) 40 MG tablet [Pharmacy Med Name: BENAZEPRIL HCL 40 MG TABLET] 30 tablet 0    Sig: Take 1 tablet (40 mg total) by mouth daily. NEEDS APPOINTMENT FOR FURTHER REFILLS     Cardiovascular:  ACE Inhibitors Failed - 02/23/2022  8:47 AM      Failed - Cr in normal range and within 180 days    Creatinine, Ser  Date Value Ref Range Status  06/04/2021 0.90 0.76 - 1.27 mg/dL Final         Failed - K in normal range and within 180 days    Potassium  Date Value Ref Range Status  06/04/2021 4.4 3.5 - 5.2 mmol/L Final         Failed - Valid encounter within last 6 months    Recent Outpatient Visits           8 months ago Dysfunction of left eustachian tube   [facility] McElwee, Lauren A, NP   8 months ago Primary hypertension   [facility] Vigg, Avanti, MD   2 years ago Neck pain   [facility] [place], [place] T, NP   2 years ago Essential hypertension   [facility] [place], [place]   2 years ago Dizziness   [facility] [place], Salley Hews, [place]       Future Appointments             In 1 month [place], [place] T, NP [facility], [facility]            Passed - Patient is not pregnant      Passed - Last BP in normal range    BP Readings from Last 1 Encounters:  06/26/21 128/72

## 2022-03-16 DIAGNOSIS — L2389 Allergic contact dermatitis due to other agents: Secondary | ICD-10-CM | POA: Diagnosis not present

## 2022-03-21 ENCOUNTER — Other Ambulatory Visit: Payer: Self-pay | Admitting: Nurse Practitioner

## 2022-03-21 DIAGNOSIS — I10 Essential (primary) hypertension: Secondary | ICD-10-CM

## 2022-03-22 ENCOUNTER — Other Ambulatory Visit: Payer: Self-pay | Admitting: Nurse Practitioner

## 2022-03-22 DIAGNOSIS — K2100 Gastro-esophageal reflux disease with esophagitis, without bleeding: Secondary | ICD-10-CM

## 2022-03-23 NOTE — Telephone Encounter (Signed)
Requested medication (s) are due for refill today: yes  Requested medication (s) are on the active medication list: yes  Last refill:  02/25/22  Future visit scheduled: yes  Notes to clinic:  Unable to refill per protocol, courtesy refill already given, routing for provider approval.      Requested Prescriptions  Pending Prescriptions Disp Refills   benazepril (LOTENSIN) 40 MG tablet [Pharmacy Med Name: BENAZEPRIL HCL 40 MG TABLET] 30 tablet 0    Sig: TAKE 1 TABLET (40 MG TOTAL) BY MOUTH DAILY. NEEDS APPOINTMENT FOR FURTHER REFILLS     Cardiovascular:  ACE Inhibitors Failed - 03/21/2022  2:31 PM      Failed - Cr in normal range and within 180 days    Creatinine, Ser  Date Value Ref Range Status  06/04/2021 0.90 0.76 - 1.27 mg/dL Final         Failed - K in normal range and within 180 days    Potassium  Date Value Ref Range Status  06/04/2021 4.4 3.5 - 5.2 mmol/L Final         Failed - Valid encounter within last 6 months    Recent Outpatient Visits           9 months ago Dysfunction of left eustachian tube   [facility] McElwee, Lauren A, NP   9 months ago Primary hypertension   [facility] Vigg, Avanti, MD   2 years ago Neck pain   [place], [place] T, NP   2 years ago Essential hypertension   [facility] [place] Roof [place], [place]   2 years ago [place], Lilia Argue, [place]       Future Appointments             In 1 week [place], Henrine Screws T, NP [facility], [place] - Patient is not pregnant      Passed - Last BP in normal range    BP Readings from Last 1 Encounters:  06/26/21 128/72

## 2022-03-23 NOTE — Telephone Encounter (Signed)
Requested Prescriptions  Pending Prescriptions Disp Refills  . omeprazole (PRILOSEC) 20 MG capsule [Pharmacy Med Name: OMEPRAZOLE DR 20 MG CAPSULE] 90 capsule 0    Sig: TAKE 1 CAPSULE (20 MG TOTAL) BY MOUTH DAILY. NEED OFFICE VISIT FOR FURTHER REFILLS.     Gastroenterology: Proton Pump Inhibitors Passed - 03/22/2022  8:32 AM      Passed - Valid encounter within last 12 months    Recent Outpatient Visits          9 months ago Dysfunction of left eustachian tube   [facility] McElwee, Lauren A, NP   9 months ago Primary hypertension   [facility] Vigg, Avanti, MD   2 years ago Neck pain   [place], Henrine Screws T, NP   2 years ago Essential hypertension   [facility] Merrie Roof [place], [place]   2 years ago [place], Lilia Argue, [place]      Future Appointments            In 1 week Cannady, Barbaraann Faster, NP [facility], [facility]

## 2022-03-29 NOTE — Patient Instructions (Signed)

## 2022-03-30 ENCOUNTER — Ambulatory Visit: Payer: BC Managed Care – PPO | Admitting: Nurse Practitioner

## 2022-03-30 ENCOUNTER — Encounter: Payer: Self-pay | Admitting: Nurse Practitioner

## 2022-03-30 DIAGNOSIS — J3089 Other allergic rhinitis: Secondary | ICD-10-CM

## 2022-03-30 DIAGNOSIS — Z114 Encounter for screening for human immunodeficiency virus [HIV]: Secondary | ICD-10-CM

## 2022-03-30 DIAGNOSIS — I10 Essential (primary) hypertension: Secondary | ICD-10-CM

## 2022-03-30 DIAGNOSIS — G4733 Obstructive sleep apnea (adult) (pediatric): Secondary | ICD-10-CM | POA: Diagnosis not present

## 2022-03-30 DIAGNOSIS — Z1159 Encounter for screening for other viral diseases: Secondary | ICD-10-CM | POA: Diagnosis not present

## 2022-03-30 DIAGNOSIS — K2100 Gastro-esophageal reflux disease with esophagitis, without bleeding: Secondary | ICD-10-CM | POA: Diagnosis not present

## 2022-03-30 DIAGNOSIS — N400 Enlarged prostate without lower urinary tract symptoms: Secondary | ICD-10-CM | POA: Diagnosis not present

## 2022-03-30 MED ORDER — BENAZEPRIL HCL 40 MG PO TABS: 40.0000 mg | ORAL_TABLET | Freq: Every day | ORAL | 4 refills | Status: DC

## 2022-03-30 MED ORDER — OMEPRAZOLE 20 MG PO CPDR: 20.0000 mg | DELAYED_RELEASE_CAPSULE | Freq: Every day | ORAL | 4 refills | Status: DC

## 2022-03-30 NOTE — Assessment & Plan Note (Signed)
Chronic, ongoing with BP at goal today.  Recommend he monitor BP at least a few mornings a week at home and document.  DASH diet at home.  Continue current medication regimen and adjust as needed.  Labs today: CBC, CMP, TSH, lipid.  Refills sent in.  Return in annually for physical.

## 2022-03-30 NOTE — Assessment & Plan Note (Signed)
BMI 39.96 with HTN and OSA.  Recommended eating smaller high protein, low fat meals more frequently and exercising 30 mins a day 5 times a week with a goal of 10-15lb weight loss in the next 3 months. Patient voiced their understanding and motivation to adhere to these recommendations.

## 2022-03-30 NOTE — Assessment & Plan Note (Signed)
Chronic, stable with 100% use of CPAP.  Continue this consistent use.

## 2022-03-30 NOTE — Progress Notes (Signed)
BP 130/83   Pulse 68   Temp 98.1 F (36.7 C) (Oral)   Ht _0  (1.981 m)   Wt (!) 345 lb 12.8 oz (156.9 kg)   SpO2 96%   BMI 39.96 kg/m    Subjective:    Patient ID: Jesse Wade, male    DOB: 1966-01-02, 56 y.o.   MRN: [id number]  HPI: NIMA KEMPPAINEN is a 56 y.o. male  Chief Complaint  Patient presents with   Hypertension   Gastroesophageal Reflux   Benign Prostatic Hypertrophy   Sleep Apnea   HYPERTENSION  Continues on Benazepril daily.  Uses CPAP 100% of the time, diagnosed 5 or more years ago. Hypertension status: stable  Satisfied with current treatment? yes Duration of hypertension: chronic BP monitoring frequency:  rarely BP range: not often checking BP medication side effects:  no Medication compliance: good compliance Aspirin: no Recurrent headaches: no Visual changes: no Palpitations: no Dyspnea: no Chest pain: no Lower extremity edema: no Dizzy/lightheaded: no  The 10-year ASCVD risk score (Arnett DK, et al., 2019) is: 4.6%   Values used to calculate the score:     Age: 64 years     Sex: Male     Is Non-Hispanic African American: No     Diabetic: No     Tobacco smoker: No     Systolic Blood Pressure: 295 mmHg     Is BP treated: Yes     HDL Cholesterol: 48 mg/dL     Total Cholesterol: 139 mg/dL   GERD Continues on Omeprazole daily. GERD control status: stable Satisfied with current treatment? yes Heartburn frequency: none Medication side effects: no  Medication compliance: stable Dysphagia: no Odynophagia:  no Hematemesis: no Blood in stool: no EGD: no   Relevant past medical, surgical, family and social history reviewed and updated as indicated. Interim medical history since our last visit reviewed. Allergies and medications reviewed and updated.  Review of Systems  Constitutional:  Negative for activity change, diaphoresis, fatigue and fever.  Respiratory:  Negative for cough, chest tightness, shortness of breath and wheezing.    Cardiovascular:  Negative for chest pain, palpitations and leg swelling.  Gastrointestinal: Negative.   Neurological: Negative.   Psychiatric/Behavioral: Negative.     Per HPI unless specifically indicated above     Objective:    BP 130/83   Pulse 68   Temp 98.1 F (36.7 C) (Oral)   Ht _1  (1.981 m)   Wt (!) 345 lb 12.8 oz (156.9 kg)   SpO2 96%   BMI 39.96 kg/m   Wt Readings from Last 3 Encounters:  03/30/22 (!) 345 lb 12.8 oz (156.9 kg)  06/26/21 (!) 337 lb (152.9 kg)  06/04/21 (!) 331 lb 1.6 oz (150.2 kg)    Physical Exam Vitals and nursing note reviewed.  Constitutional:      General: He is awake. He is not in acute distress.    Appearance: He is well-developed and well-groomed. He is obese. He is not ill-appearing or toxic-appearing.  HENT:     Head: Normocephalic and atraumatic.     Right Ear: Hearing normal. No drainage.     Left Ear: Hearing normal. No drainage.  Eyes:     General: Lids are normal.        Right eye: No discharge.        Left eye: No discharge.     Conjunctiva/sclera: Conjunctivae normal.     Pupils: Pupils are equal, round, and reactive to  light.  Neck:     Thyroid: No thyromegaly.     Vascular: No carotid bruit.  Cardiovascular:     Rate and Rhythm: Normal rate and regular rhythm.     Heart sounds: Normal heart sounds, S1 normal and S2 normal. No murmur heard.    No gallop.  Pulmonary:     Effort: Pulmonary effort is normal. No accessory muscle usage or respiratory distress.     Breath sounds: Normal breath sounds.  Abdominal:     General: Bowel sounds are normal.     Palpations: Abdomen is soft.  Musculoskeletal:        General: Normal range of motion.     Cervical back: Normal range of motion and neck supple.     Right lower leg: No edema.     Left lower leg: No edema.  Lymphadenopathy:     Cervical: No cervical adenopathy.  Skin:    General: Skin is warm and dry.     Capillary Refill: Capillary refill takes less than 2  seconds.  Neurological:     Mental Status: He is alert and oriented to person, place, and time.     Deep Tendon Reflexes: Reflexes are normal and symmetric.  Psychiatric:        Attention and Perception: Attention normal.        Mood and Affect: Mood normal.        Speech: Speech normal.        Behavior: Behavior normal. Behavior is cooperative.        Thought Content: Thought content normal.    Results for orders placed or performed in visit on 06/04/21  TSH  Result Value Ref Range   TSH 2.030 0.450 - 4.500 uIU/mL  Lipid panel  Result Value Ref Range   Cholesterol, Total 139 100 - 199 mg/dL   Triglycerides 49 0 - 149 mg/dL   HDL 48 >39 mg/dL   VLDL Cholesterol Cal 11 5 - 40 mg/dL   LDL Chol Calc (NIH) 80 0 - 99 mg/dL   Chol/HDL Ratio 2.9 0.0 - 5.0 ratio  CBC with Differential/Platelet  Result Value Ref Range   WBC 4.8 3.4 - 10.8 x10E3/uL   RBC 5.08 4.14 - 5.80 x10E6/uL   Hemoglobin 15.4 13.0 - 17.7 g/dL   Hematocrit 44.6 37.5 - 51.0 %   MCV 88 79 - 97 fL   MCH 30.3 26.6 - 33.0 pg   MCHC 34.5 31.5 - 35.7 g/dL   RDW 11.8 11.6 - 15.4 %   Platelets 207 150 - 450 x10E3/uL   Neutrophils 55 Not Estab. %   Lymphs 29 Not Estab. %   Monocytes 11 Not Estab. %   Eos 4 Not Estab. %   Basos 1 Not Estab. %   Neutrophils Absolute 2.7 1.4 - 7.0 x10E3/uL   Lymphocytes Absolute 1.4 0.7 - 3.1 x10E3/uL   Monocytes Absolute 0.5 0.1 - 0.9 x10E3/uL   EOS (ABSOLUTE) 0.2 0.0 - 0.4 x10E3/uL   Basophils Absolute 0.0 0.0 - 0.2 x10E3/uL   Immature Granulocytes 0 Not Estab. %   Immature Grans (Abs) 0.0 0.0 - 0.1 x10E3/uL  Comprehensive metabolic panel  Result Value Ref Range   Glucose 98 70 - 99 mg/dL   BUN 19 6 - 24 mg/dL   Creatinine, Ser 0.90 0.76 - 1.27 mg/dL   eGFR 101 >59 mL/min/1.73   BUN/Creatinine Ratio 21 (H) 9 - 20   Sodium 142 134 - 144 mmol/L   Potassium 4.4  3.5 - 5.2 mmol/L   Chloride 106 96 - 106 mmol/L   CO2 24 20 - 29 mmol/L   Calcium 9.0 8.7 - 10.2 mg/dL   Total Protein  6.5 6.0 - 8.5 g/dL   Albumin 4.4 3.8 - 4.9 g/dL   Globulin, Total 2.1 1.5 - 4.5 g/dL   Albumin/Globulin Ratio 2.1 1.2 - 2.2   Bilirubin Total 0.4 0.0 - 1.2 mg/dL   Alkaline Phosphatase 89 44 - 121 IU/L   AST 18 0 - 40 IU/L   ALT 20 0 - 44 IU/L  Urinalysis, Routine w reflex microscopic  Result Value Ref Range   Specific Gravity, UA 1.012 1.005 - 1.030   pH, UA 6.5 5.0 - 7.5   Color, UA Yellow Yellow   Appearance Ur Clear Clear   Leukocytes,UA Negative Negative   Protein,UA Negative Negative/Trace   Glucose, UA Negative Negative   Ketones, UA Negative Negative   RBC, UA Negative Negative   Bilirubin, UA Negative Negative   Urobilinogen, Ur 0.2 0.2 - 1.0 mg/dL   Nitrite, UA Negative Negative   Microscopic Examination Comment       Assessment & Plan:   Problem List Items Addressed This Visit       Cardiovascular and Mediastinum   Hypertension    Chronic, ongoing with BP at goal today.  Recommend he monitor BP at least a few mornings a week at home and document.  DASH diet at home.  Continue current medication regimen and adjust as needed.  Labs today: CBC, CMP, TSH, lipid.  Refills sent in.  Return in annually for physical.       Relevant Medications   benazepril (LOTENSIN) 40 MG tablet   Other Relevant Orders   CBC with Differential/Platelet   Comprehensive metabolic panel   Lipid Panel w/o Chol/HDL Ratio   TSH     Respiratory   OSA treated with BiPAP    Chronic, stable with 100% use of CPAP.  Continue this consistent use.        Digestive   Reflux esophagitis    Chronic, ongoing.  At this time will continue Omeprazole, but recommend occasional trial at cutting back.  Risks of PPI use were discussed with patient including bone loss, C. Diff diarrhea, pneumonia, infections, CKD, electrolyte abnormalities.  Verbalizes understanding and chooses to continue the medication. Mag level today.      Relevant Medications   omeprazole (PRILOSEC) 20 MG capsule   Other  Relevant Orders   Magnesium     Other   Morbid obesity ([place]) - Primary    BMI 39.96 with HTN and OSA.  Recommended eating smaller high protein, low fat meals more frequently and exercising 30 mins a day 5 times a week with a goal of 10-15lb weight loss in the next 3 months. Patient voiced their understanding and motivation to adhere to these recommendations.       Other Visit Diagnoses     Benign prostatic hyperplasia without lower urinary tract symptoms       PSA on labs today.   Relevant Orders   PSA   Encounter for screening for HIV       HIV screen on labs today per guidelines for one time screening, discussed with patient.   Relevant Orders   HIV Antibody (routine testing w rflx)   Need for hepatitis C screening test       Hep C screen on labs today per guidelines for one time screening, discussed with patient.  Relevant Orders   Hepatitis C antibody        Follow up plan: Return in about 1 year (around 03/31/2023) for Annual physical.

## 2022-03-30 NOTE — Assessment & Plan Note (Signed)
Chronic, ongoing.  At this time will continue Omeprazole, but recommend occasional trial at cutting back.  Risks of PPI use were discussed with patient including bone loss, C. Diff diarrhea, pneumonia, infections, CKD, electrolyte abnormalities.  Verbalizes understanding and chooses to continue the medication. Mag level today.

## 2022-03-31 LAB — COMPREHENSIVE METABOLIC PANEL
ALT: 24 IU/L (ref 0–44)
AST: 20 IU/L (ref 0–40)
Albumin/Globulin Ratio: 2.1 (ref 1.2–2.2)
Albumin: 4.5 g/dL (ref 3.8–4.9)
Alkaline Phosphatase: 74 IU/L (ref 44–121)
BUN/Creatinine Ratio: 22 — ABNORMAL HIGH (ref 9–20)
BUN: 21 mg/dL (ref 6–24)
Bilirubin Total: 0.4 mg/dL (ref 0.0–1.2)
CO2: 22 mmol/L (ref 20–29)
Calcium: 9.1 mg/dL (ref 8.7–10.2)
Chloride: 105 mmol/L (ref 96–106)
Creatinine, Ser: 0.97 mg/dL (ref 0.76–1.27)
Globulin, Total: 2.1 g/dL (ref 1.5–4.5)
Glucose: 89 mg/dL (ref 70–99)
Potassium: 4 mmol/L (ref 3.5–5.2)
Sodium: 141 mmol/L (ref 134–144)
Total Protein: 6.6 g/dL (ref 6.0–8.5)
eGFR: 92 mL/min/{1.73_m2} (ref >59)

## 2022-03-31 LAB — CBC WITH DIFFERENTIAL/PLATELET
Basophils Absolute: 0.1 10*3/uL (ref 0.0–0.2)
Basos: 1 %
EOS (ABSOLUTE): 0.3 10*3/uL (ref 0.0–0.4)
Eos: 4 %
Hematocrit: 46.5 % (ref 37.5–51.0)
Hemoglobin: 15.8 g/dL (ref 13.0–17.7)
Immature Grans (Abs): 0 10*3/uL (ref 0.0–0.1)
Immature Granulocytes: 0 %
Lymphocytes Absolute: 1.8 10*3/uL (ref 0.7–3.1)
Lymphs: 29 %
MCH: 30.3 pg (ref 26.6–33.0)
MCHC: 34 g/dL (ref 31.5–35.7)
MCV: 89 fL (ref 79–97)
Monocytes Absolute: 0.5 10*3/uL (ref 0.1–0.9)
Monocytes: 8 %
Neutrophils Absolute: 3.5 10*3/uL (ref 1.4–7.0)
Neutrophils: 58 %
Platelets: 227 10*3/uL (ref 150–450)
RBC: 5.22 x10E6/uL (ref 4.14–5.80)
RDW: 12.1 % (ref 11.6–15.4)
WBC: 6.1 10*3/uL (ref 3.4–10.8)

## 2022-03-31 LAB — MAGNESIUM: Magnesium: 1.8 mg/dL (ref 1.6–2.3)

## 2022-03-31 LAB — LIPID PANEL W/O CHOL/HDL RATIO
Cholesterol, Total: 143 mg/dL (ref 100–199)
HDL: 57 mg/dL (ref >39)
LDL Chol Calc (NIH): 67 mg/dL (ref 0–99)
Triglycerides: 102 mg/dL (ref 0–149)
VLDL Cholesterol Cal: 19 mg/dL (ref 5–40)

## 2022-03-31 LAB — TSH: TSH: 1.62 u[IU]/mL (ref 0.450–4.500)

## 2022-03-31 LAB — HEPATITIS C ANTIBODY: Hep C Virus Ab: NONREACTIVE

## 2022-03-31 LAB — PSA: Prostate Specific Ag, Serum: 0.4 ng/mL (ref 0.0–4.0)

## 2022-03-31 LAB — HIV ANTIBODY (ROUTINE TESTING W REFLEX): HIV Screen 4th Generation wRfx: NONREACTIVE

## 2022-03-31 NOTE — Progress Notes (Signed)
Contacted via MyChart   Good afternoon Jesse Wade, your labs have returned and are overall nice and stable.  No medication changes needed.  Any questions on these? Keep being amazing!!  Thank you for allowing me to participate in your care.  I appreciate you. Kindest regards, Remmy Riffe

## 2022-06-08 DIAGNOSIS — M25511 Pain in right shoulder: Secondary | ICD-10-CM | POA: Diagnosis not present

## 2022-06-08 DIAGNOSIS — M19012 Primary osteoarthritis, left shoulder: Secondary | ICD-10-CM | POA: Diagnosis not present

## 2022-06-08 DIAGNOSIS — M19011 Primary osteoarthritis, right shoulder: Secondary | ICD-10-CM | POA: Diagnosis not present

## 2022-06-08 DIAGNOSIS — M25512 Pain in left shoulder: Secondary | ICD-10-CM | POA: Diagnosis not present

## 2022-07-22 ENCOUNTER — Ambulatory Visit: Payer: BC Managed Care – PPO | Admitting: Nurse Practitioner

## 2022-07-22 ENCOUNTER — Encounter: Payer: Self-pay | Admitting: Nurse Practitioner

## 2022-07-22 VITALS — BP 130/82 | HR 62 | Temp 98.2°F | Ht 77.99 in | Wt 338.8 lb

## 2022-07-22 DIAGNOSIS — H6992 Unspecified Eustachian tube disorder, left ear: Secondary | ICD-10-CM | POA: Diagnosis not present

## 2022-07-22 MED ORDER — PREDNISONE 20 MG PO TABS: 40.0000 mg | ORAL_TABLET | Freq: Every day | ORAL | 0 refills | Status: AC

## 2022-07-22 NOTE — Progress Notes (Signed)
BP 130/82   Pulse 62   Temp 98.2 F (36.8 C) (Oral)   Ht 6' 5.99" (1.981 m)   Wt (!) 338 lb 12.8 oz (153.7 kg)   SpO2 98%   BMI 39.16 kg/m    Subjective:    Patient ID: Jesse Wade, male    DOB: 1966/02/26, 57 y.o.   MRN: [id number]  HPI: Jesse Wade is a 57 y.o. male  Chief Complaint  Patient presents with   Ear issue    Left ear, states that it popped this morning and now he can't hear very well out of it   EAR ISSUE This morning when went to work was in the back checking stuff, when stood up felt a pop in left ear + dizziness after this.  Now can not hear well out of ear.  Had a headache, went home and took nap on couch.    Continues on allergy medications: Xyzal, Singulair, Nasacort. Duration: days Involved ear(s): left Fever: no Otorrhea: no Upper respiratory infection symptoms: no Pruritus: no Hearing loss: no Water immersion no Using Q-tips: occasionally Recurrent otitis media: no Status: stable Treatments attempted: none   Relevant past medical, surgical, family and social history reviewed and updated as indicated. Interim medical history since our last visit reviewed. Allergies and medications reviewed and updated.  Review of Systems  Constitutional: Negative.   HENT:  Negative for congestion, ear discharge, ear pain, postnasal drip, rhinorrhea, sinus pressure, sinus pain, sneezing, sore throat and voice change.   Respiratory:  Negative for cough, chest tightness, shortness of breath and wheezing.   Cardiovascular:  Negative for chest pain, palpitations and leg swelling.  Neurological:  Positive for dizziness.  Psychiatric/Behavioral: Negative.      Per HPI unless specifically indicated above     Objective:    BP 130/82   Pulse 62   Temp 98.2 F (36.8 C) (Oral)   Ht 6' 5.99" (1.981 m)   Wt (!) 338 lb 12.8 oz (153.7 kg)   SpO2 98%   BMI 39.16 kg/m   Wt Readings from Last 3 Encounters:  07/22/22 (!) 338 lb 12.8 oz (153.7 kg)  03/30/22 (!)  345 lb 12.8 oz (156.9 kg)  06/26/21 (!) 337 lb (152.9 kg)    Physical Exam Vitals and nursing note reviewed.  Constitutional:      General: He is awake. He is not in acute distress.    Appearance: He is well-developed and well-groomed. He is obese. He is not ill-appearing or toxic-appearing.  HENT:     Head: Normocephalic.     Right Ear: Hearing, tympanic membrane, ear canal and external ear normal.     Left Ear: Hearing, ear canal and external ear normal. No drainage. A middle ear effusion is present. Tympanic membrane is not injected or perforated.  Eyes:     General: Lids are normal.     Extraocular Movements: Extraocular movements intact.     Conjunctiva/sclera: Conjunctivae normal.  Neck:     Thyroid: No thyromegaly.     Vascular: No carotid bruit.  Cardiovascular:     Rate and Rhythm: Normal rate and regular rhythm.     Heart sounds: Normal heart sounds.  Pulmonary:     Effort: Pulmonary effort is normal. No accessory muscle usage or respiratory distress.     Breath sounds: Normal breath sounds.  Abdominal:     General: Bowel sounds are normal. There is no distension.     Palpations: Abdomen is soft.  Tenderness: There is no abdominal tenderness.  Musculoskeletal:     Cervical back: Full passive range of motion without pain.     Right lower leg: No edema.     Left lower leg: No edema.  Lymphadenopathy:     Cervical: No cervical adenopathy.  Skin:    General: Skin is warm.     Capillary Refill: Capillary refill takes less than 2 seconds.  Neurological:     Mental Status: He is alert and oriented to person, place, and time.     Deep Tendon Reflexes: Reflexes are normal and symmetric.     Reflex Scores:      Brachioradialis reflexes are 2+ on the right side and 2+ on the left side.      Patellar reflexes are 2+ on the right side and 2+ on the left side. Psychiatric:        Attention and Perception: Attention normal.        Mood and Affect: Mood normal.         Speech: Speech normal.        Behavior: Behavior normal. Behavior is cooperative.        Thought Content: Thought content normal.     Results for orders placed or performed in visit on 03/30/22  CBC with Differential/Platelet  Result Value Ref Range   WBC 6.1 3.4 - 10.8 x10E3/uL   RBC 5.22 4.14 - 5.80 x10E6/uL   Hemoglobin 15.8 13.0 - 17.7 g/dL   Hematocrit 46.5 37.5 - 51.0 %   MCV 89 79 - 97 fL   MCH 30.3 26.6 - 33.0 pg   MCHC 34.0 31.5 - 35.7 g/dL   RDW 12.1 11.6 - 15.4 %   Platelets 227 150 - 450 x10E3/uL   Neutrophils 58 Not Estab. %   Lymphs 29 Not Estab. %   Monocytes 8 Not Estab. %   Eos 4 Not Estab. %   Basos 1 Not Estab. %   Neutrophils Absolute 3.5 1.4 - 7.0 x10E3/uL   Lymphocytes Absolute 1.8 0.7 - 3.1 x10E3/uL   Monocytes Absolute 0.5 0.1 - 0.9 x10E3/uL   EOS (ABSOLUTE) 0.3 0.0 - 0.4 x10E3/uL   Basophils Absolute 0.1 0.0 - 0.2 x10E3/uL   Immature Granulocytes 0 Not Estab. %   Immature Grans (Abs) 0.0 0.0 - 0.1 x10E3/uL  Comprehensive metabolic panel  Result Value Ref Range   Glucose 89 70 - 99 mg/dL   BUN 21 6 - 24 mg/dL   Creatinine, Ser 0.97 0.76 - 1.27 mg/dL   eGFR 92 >59 mL/min/1.73   BUN/Creatinine Ratio 22 (H) 9 - 20   Sodium 141 134 - 144 mmol/L   Potassium 4.0 3.5 - 5.2 mmol/L   Chloride 105 96 - 106 mmol/L   CO2 22 20 - 29 mmol/L   Calcium 9.1 8.7 - 10.2 mg/dL   Total Protein 6.6 6.0 - 8.5 g/dL   Albumin 4.5 3.8 - 4.9 g/dL   Globulin, Total 2.1 1.5 - 4.5 g/dL   Albumin/Globulin Ratio 2.1 1.2 - 2.2   Bilirubin Total 0.4 0.0 - 1.2 mg/dL   Alkaline Phosphatase 74 44 - 121 IU/L   AST 20 0 - 40 IU/L   ALT 24 0 - 44 IU/L  Lipid Panel w/o Chol/HDL Ratio  Result Value Ref Range   Cholesterol, Total 143 100 - 199 mg/dL   Triglycerides 102 0 - 149 mg/dL   HDL 57 >39 mg/dL   VLDL Cholesterol Cal 19 5 - 40 mg/dL  LDL Chol Calc (NIH) 67 0 - 99 mg/dL  PSA  Result Value Ref Range   Prostate Specific Ag, Serum 0.4 0.0 - 4.0 ng/mL  TSH  Result Value  Ref Range   TSH 1.620 0.450 - 4.500 uIU/mL  HIV Antibody (routine testing w rflx)  Result Value Ref Range   HIV Screen 4th Generation wRfx Non Reactive Non Reactive  Hepatitis C antibody  Result Value Ref Range   Hep C Virus Ab Non Reactive Non Reactive  Magnesium  Result Value Ref Range   Magnesium 1.8 1.6 - 2.3 mg/dL      Assessment & Plan:   Problem List Items Addressed This Visit       Nervous and Auditory   Eustachian tube dysfunction, left - Primary    Acute to left ear, continue all allergy medications at this time.  Script for Prednisone 40 MG daily for 5 days sent in.  Recommend plenty of rest and fluid.  Educated on diagnosis.        Follow up plan: Return if symptoms worsen or fail to improve.

## 2022-07-22 NOTE — Assessment & Plan Note (Signed)
Acute to left ear, continue all allergy medications at this time.  Script for Prednisone 40 MG daily for 5 days sent in.  Recommend plenty of rest and fluid.  Educated on diagnosis.

## 2022-07-22 NOTE — Patient Instructions (Signed)
Eustachian Tube Dysfunction  Eustachian tube dysfunction refers to a condition in which a blockage develops in the narrow passage that connects the middle ear to the back of the nose (eustachian tube). The eustachian tube regulates air pressure in the middle ear by letting air move between the ear and nose. It also helps to drain fluid from the middle ear space. Eustachian tube dysfunction can affect one or both ears. When the eustachian tube does not function properly, air pressure, fluid, or both can build up in the middle ear. What are the causes? This condition occurs when the eustachian tube becomes blocked or cannot open normally. Common causes of this condition include: Ear infections. Colds and other infections that affect the nose, mouth, and throat (upper respiratory tract). Allergies. Irritation from cigarette smoke. Irritation from stomach acid coming up into the esophagus (gastroesophageal reflux). The esophagus is the part of the body that moves food from the mouth to the stomach. Sudden changes in air pressure, such as from descending in an airplane or scuba diving. Abnormal growths in the nose or throat, such as: Growths that line the nose (nasal polyps). Abnormal growth of cells (tumors). Enlarged tissue at the back of the throat (adenoids). What increases the risk? You are more likely to develop this condition if: You smoke. You are overweight. You are a child who has: Certain birth defects of the mouth, such as cleft palate. Large tonsils or adenoids. What are the signs or symptoms? Common symptoms of this condition include: A feeling of fullness in the ear. Ear pain. Clicking or popping noises in the ear. Ringing in the ear (tinnitus). Hearing loss. Loss of balance. Dizziness. Symptoms may get worse when the air pressure around you changes, such as when you travel to an area of high elevation, fly on an airplane, or go scuba diving. How is this diagnosed? This  condition may be diagnosed based on: Your symptoms. A physical exam of your ears, nose, and throat. Tests, such as those that measure: The movement of your eardrum. Your hearing (audiometry). How is this treated? Treatment depends on the cause and severity of your condition. In mild cases, you may relieve your symptoms by moving air into your ears. This is called "popping the ears." In more severe cases, or if you have symptoms of fluid in your ears, treatment may include: Medicines to relieve congestion (decongestants). Medicines that treat allergies (antihistamines). Nasal sprays or ear drops that contain medicines that reduce swelling (steroids). A procedure to drain the fluid in your eardrum. In this procedure, a small tube may be placed in the eardrum to: Drain the fluid. Restore the air in the middle ear space. A procedure to insert a balloon device through the nose to inflate the opening of the eustachian tube (balloon dilation). Follow these instructions at home: Lifestyle Do not do any of the following until your health care provider approves: Travel to high altitudes. Fly in airplanes. Work in a pressurized cabin or room. Scuba dive. Do not use any products that contain nicotine or tobacco. These products include cigarettes, chewing tobacco, and vaping devices, such as e-cigarettes. If you need help quitting, ask your health care provider. Keep your ears dry. Wear fitted earplugs during showering and bathing. Dry your ears completely after. General instructions Take over-the-counter and prescription medicines only as told by your health care provider. Use techniques to help pop your ears as recommended by your health care provider. These may include: Chewing gum. Yawning. Frequent, forceful swallowing.   Closing your mouth, holding your nose closed, and gently blowing as if you are trying to blow air out of your nose. Keep all follow-up visits. This is important. Contact a  health care provider if: Your symptoms do not go away after treatment. Your symptoms come back after treatment. You are unable to pop your ears. You have: A fever. Pain in your ear. Pain in your head or neck. Fluid draining from your ear. Your hearing suddenly changes. You become very dizzy. You lose your balance. Get help right away if: You have a sudden, severe increase in any of your symptoms. Summary Eustachian tube dysfunction refers to a condition in which a blockage develops in the eustachian tube. It can be caused by ear infections, allergies, inhaled irritants, or abnormal growths in the nose or throat. Symptoms may include ear pain or fullness, hearing loss, or ringing in the ears. Mild cases are treated with techniques to unblock the ears, such as yawning or chewing gum. More severe cases are treated with medicines or procedures. This information is not intended to replace advice given to you by your health care provider. Make sure you discuss any questions you have with your health care provider. Document Revised: 08/19/2020 Document Reviewed: 08/19/2020 Elsevier Patient Education  2023 [employer]  

## 2022-09-07 DIAGNOSIS — M19012 Primary osteoarthritis, left shoulder: Secondary | ICD-10-CM | POA: Diagnosis not present

## 2022-09-07 DIAGNOSIS — M19011 Primary osteoarthritis, right shoulder: Secondary | ICD-10-CM | POA: Diagnosis not present

## 2022-09-07 DIAGNOSIS — M542 Cervicalgia: Secondary | ICD-10-CM | POA: Diagnosis not present

## 2022-10-07 DIAGNOSIS — M542 Cervicalgia: Secondary | ICD-10-CM | POA: Diagnosis not present

## 2022-10-15 DIAGNOSIS — M542 Cervicalgia: Secondary | ICD-10-CM | POA: Diagnosis not present

## 2022-10-16 ENCOUNTER — Other Ambulatory Visit: Payer: Self-pay

## 2022-10-16 DIAGNOSIS — Z981 Arthrodesis status: Secondary | ICD-10-CM

## 2022-10-16 MED ORDER — MELOXICAM 15 MG PO TABS: 15.0000 mg | ORAL_TABLET | Freq: Every day | ORAL | 3 refills | Status: DC

## 2022-10-16 NOTE — Telephone Encounter (Signed)
Medication refill for Meloxicam 7.5mg   last ov 07/06/22, upcoming ov 04/05/23 . Please advise

## 2022-10-23 DIAGNOSIS — M5412 Radiculopathy, cervical region: Secondary | ICD-10-CM | POA: Diagnosis not present

## 2022-11-03 DIAGNOSIS — M5412 Radiculopathy, cervical region: Secondary | ICD-10-CM | POA: Diagnosis not present

## 2022-11-12 DIAGNOSIS — M5412 Radiculopathy, cervical region: Secondary | ICD-10-CM | POA: Diagnosis not present

## 2022-12-22 ENCOUNTER — Ambulatory Visit: Payer: BC Managed Care – PPO | Admitting: Physician Assistant

## 2022-12-22 ENCOUNTER — Encounter: Payer: Self-pay | Admitting: Physician Assistant

## 2022-12-22 VITALS — BP 155/97 | HR 63 | Ht 77.0 in | Wt 340.0 lb

## 2022-12-22 DIAGNOSIS — B86 Scabies: Secondary | ICD-10-CM | POA: Diagnosis not present

## 2022-12-22 DIAGNOSIS — R21 Rash and other nonspecific skin eruption: Secondary | ICD-10-CM

## 2022-12-22 MED ORDER — PERMETHRIN 5 % EX CREA
TOPICAL_CREAM | CUTANEOUS | 1 refills | Status: DC
Start: 2022-12-22 — End: 2023-01-14

## 2022-12-22 NOTE — Progress Notes (Addendum)
Acute Office Visit   Patient: Jesse Wade   DOB: 04/15/1966   57 y.o. Male  MRN: [id number] Visit Date: 12/22/2022  Today's healthcare provider: Oswaldo Conroy Omario Ander, PA-C  Introduced myself to the patient as a Secondary school teacher and provided education on APPs in clinical practice.    Chief Complaint  Patient presents with   Rash    Patient says he has seen a Dermatology in the past and did not get any answers. Patient says he will have these little blisters to pop up all over his body. Patient says he is will pop the blisters as they itch and his is annoyed. Patient says he first noticed then they weren't as bad, but they are gradually gotten worse. Patient was prescribed Clobetasol ointment and says it helps a little, but wants to discuss preventative.    Subjective    HPI HPI     Rash    Additional comments: Patient says he has seen a Dermatology in the past and did not get any answers. Patient says he will have these little blisters to pop up all over his body. Patient says he is will pop the blisters as they itch and his is annoyed. Patient says he first noticed then they weren't as bad, but they are gradually gotten worse. Patient was prescribed Clobetasol ointment and says it helps a little, but wants to discuss preventative.       Last edited by Malen Gauze, CMA on 12/22/2022 10:35 AM.      Addison Bailey He reports he went to a Derm office in [place] a few months ago and was given Clobetasol  Duration:  months  Location: Generalized, head, hands, arms, legs   Itching: yes Burning: no Redness: no Oozing: no Scaling: no Blisters: yes- reports they are very itchy then when they pop they are no longer itchy  Painful: no Fevers: no Change in detergents/soaps/personal care products: no Recent illness: no Recent travel:yes- they just got back from the [place] but rash was occurring prior to travel  History of same: no Context: worse and fluctuating He reports his wife gets  them every once in a while but they don't seem as severe as his flares.  Alleviating factors:  Clobetasol  Treatments attempted: Clobetasol  Shortness of breath: no  Throat/tongue swelling: no Myalgias/arthralgias: no   Medications: Outpatient Medications Prior to Visit  Medication Sig   benazepril (LOTENSIN) 40 MG tablet Take 1 tablet (40 mg total) by mouth daily.   levocetirizine (XYZAL) 5 MG tablet Take 5 mg by mouth every evening.    meloxicam (MOBIC) 15 MG tablet Take 1 tablet (15 mg total) by mouth daily.   montelukast (SINGULAIR) 10 MG tablet Take 1 tablet (10 mg total) by mouth at bedtime.   omeprazole (PRILOSEC) 20 MG capsule Take 1 capsule (20 mg total) by mouth daily.   [DISCONTINUED] triamcinolone (NASACORT) 55 MCG/ACT AERO nasal inhaler 1 spray in each nostril (Patient not taking: Reported on 01/14/2023)   No facility-administered medications prior to visit.    Review of Systems  Skin:  Positive for rash.       Objective    BP (!) 155/97 (BP Location: Left Arm, Cuff Size: Normal)   Pulse 63   Ht 6\' 5"  (1.956 m)   Wt (!) 340 lb (154.2 kg)   SpO2 99%   BMI 40.32 kg/m    Physical Exam Vitals reviewed.  Constitutional:  General: He is awake.     Appearance: Normal appearance. He is well-developed and well-groomed.  HENT:     Head: Normocephalic and atraumatic.  Pulmonary:     Effort: Pulmonary effort is normal.  Musculoskeletal:     Cervical back: Normal range of motion.  Skin:    General: Skin is warm and dry.     Findings: Rash present. Rash is macular and papular.     Comments: Multiple areas of maculopapular rash with areas of blisters present - areas appear to have some tunneling consistent with scabies   Neurological:     Mental Status: He is alert.  Psychiatric:        Behavior: Behavior is cooperative.       No results found for any visits on 12/22/22.  Assessment & Plan      No follow-ups on file.       Problem List Items  Addressed This Visit   None Visit Diagnoses     Scabies    -  Primary Chronic, ongoing condition Reports recurrent itchy blisters along hands, forearms, knees, and head that is not improving with Clobetasol and reports similar flares in wife from time to time Will treat with permethrin cream - discussed application and treatment protocols with him, recommend laundering home linens and clothing for further prevention Reviewed return precautions- follow up as needed.     Relevant Medications   permethrin (ELIMITE) 5 % cream        No follow-ups on file.   I, Isobelle Tuckett E Kasyn Stouffer, PA-C, have reviewed all documentation for this visit. The documentation on 02/08/23 for the exam, diagnosis, procedures, and orders are all accurate and complete.   Jacquelin Hawking, MHS, PA-C [facility]

## 2023-01-04 ENCOUNTER — Telehealth: Payer: Self-pay | Admitting: Nurse Practitioner

## 2023-01-04 DIAGNOSIS — Z1283 Encounter for screening for malignant neoplasm of skin: Secondary | ICD-10-CM

## 2023-01-04 DIAGNOSIS — L989 Disorder of the skin and subcutaneous tissue, unspecified: Secondary | ICD-10-CM

## 2023-01-04 NOTE — Telephone Encounter (Signed)
Copied from CRM [id number]. Topic: Referral - Request for Referral >> Jan 04, 2023 10:32 AM Franchot Heidelberg wrote: Has patient seen PCP for this complaint? Yes.   *If NO, is insurance requiring patient see PCP for this issue before PCP can refer them? Referral for which specialty: Dermatology  Preferred provider/office: [facility] Reason for referral:   Pt was seen recently for skin concerns, wants to proceed with dermatology referral

## 2023-01-14 ENCOUNTER — Ambulatory Visit: Payer: BC Managed Care – PPO | Admitting: Dermatology

## 2023-01-14 VITALS — BP 127/72

## 2023-01-14 DIAGNOSIS — L130 Dermatitis herpetiformis: Secondary | ICD-10-CM | POA: Diagnosis not present

## 2023-01-14 DIAGNOSIS — L859 Epidermal thickening, unspecified: Secondary | ICD-10-CM

## 2023-01-14 DIAGNOSIS — R21 Rash and other nonspecific skin eruption: Secondary | ICD-10-CM

## 2023-01-14 MED ORDER — IVERMECTIN 3 MG PO TABS: ORAL_TABLET | ORAL | 0 refills | Status: DC

## 2023-01-14 NOTE — Patient Instructions (Signed)
Wound Care Instructions  Cleanse wound gently with soap and water once a day then pat dry with clean gauze. Apply a thin coat of Petrolatum (petroleum jelly, "Vaseline") over the wound (unless you have an allergy to this). We recommend that you use a new, sterile tube of Vaseline. Do not pick or remove scabs. Do not remove the yellow or white "healing tissue" from the base of the wound.  Cover the wound with fresh, clean, nonstick gauze and secure with paper tape. You may use Band-Aids in place of gauze and tape if the wound is small enough, but would recommend trimming much of the tape off as there is often too much. Sometimes Band-Aids can irritate the skin.  You should call the office for your biopsy report after 1 week if you have not already been contacted.  If you experience any problems, such as abnormal amounts of bleeding, swelling, significant bruising, significant pain, or evidence of infection, please call the office immediately.  FOR ADULT SURGERY PATIENTS: If you need something for pain relief you may take 1 extra strength Tylenol (acetaminophen) AND 2 Ibuprofen (200mg each) together every 4 hours as needed for pain. (do not take these if you are allergic to them or if you have a reason you should not take them.) Typically, you may only need pain medication for 1 to 3 days.     Due to recent changes in healthcare laws, you may see results of your pathology and/or laboratory studies on MyChart before the doctors have had a chance to review them. We understand that in some cases there may be results that are confusing or concerning to you. Please understand that not all results are received at the same time and often the doctors may need to interpret multiple results in order to provide you with the best plan of care or course of treatment. Therefore, we ask that you please give us 2 business days to thoroughly review all your results before contacting the office for clarification. Should  we see a critical lab result, you will be contacted sooner.   If You Need Anything After Your Visit  If you have any questions or concerns for your doctor, please call our main line at [phone] and press option 4 to reach your doctor's medical assistant. If no one answers, please leave a voicemail as directed and we will return your call as soon as possible. Messages left after 4 pm will be answered the following business day.   You may also send us a message via MyChart. We typically respond to MyChart messages within 1-2 business days.  For prescription refills, please ask your pharmacy to contact our office. Our fax number is [phone].  If you have an urgent issue when the clinic is closed that cannot wait until the next business day, you can page your doctor at the number below.    Please note that while we do our best to be available for urgent issues outside of office hours, we are not available 24/7.   If you have an urgent issue and are unable to reach us, you may choose to seek medical care at your doctor's office, retail clinic, urgent care center, or emergency room.  If you have a medical emergency, please immediately call 911 or go to the emergency department.  Pager Numbers  - Dr. Kowalski: [phone]  - Dr. Moye: [phone]  - Dr. Stewart: [phone]  In the event of inclement weather, please call our main line at   [phone] for an update on the status of any delays or closures.  Dermatology Medication Tips: Please keep the boxes that topical medications come in in order to help keep track of the instructions about where and how to use these. Pharmacies typically print the medication instructions only on the boxes and not directly on the medication tubes.   If your medication is too expensive, please contact our office at [phone] option 4 or send us a message through MyChart.   We are unable to tell what your co-pay for medications will be in  advance as this is different depending on your insurance coverage. However, we may be able to find a substitute medication at lower cost or fill out paperwork to get insurance to cover a needed medication.   If a prior authorization is required to get your medication covered by your insurance company, please allow us 1-2 business days to complete this process.  Drug prices often vary depending on where the prescription is filled and some pharmacies may offer cheaper prices.  The website www.goodrx.com contains coupons for medications through different pharmacies. The prices here do not account for what the cost may be with help from insurance (it may be cheaper with your insurance), but the website can give you the price if you did not use any insurance.  - You can print the associated coupon and take it with your prescription to the pharmacy.  - You may also stop by our office during regular business hours and pick up a GoodRx coupon card.  - If you need your prescription sent electronically to a different pharmacy, notify our office through [facility] MyChart or by phone at [phone] option 4.     Si Usted Necesita Algo Despus de Su Visita  Tambin puede enviarnos un mensaje a travs de MyChart. Por lo general respondemos a los mensajes de MyChart en el transcurso de 1 a 2 das hbiles.  Para renovar recetas, por favor pida a su farmacia que se ponga en contacto con nuestra oficina. Nuestro nmero de fax es el [phone].  Si tiene un asunto urgente cuando la clnica est cerrada y que no puede esperar hasta el siguiente da hbil, puede llamar/localizar a su doctor(a) al nmero que aparece a continuacin.   Por favor, tenga en cuenta que aunque hacemos todo lo posible para estar disponibles para asuntos urgentes fuera del horario de oficina, no estamos disponibles las 24 horas del da, los 7 das de la semana.   Si tiene un problema urgente y no puede comunicarse con nosotros, puede  optar por buscar atencin mdica  en el consultorio de su doctor(a), en una clnica privada, en un centro de atencin urgente o en una sala de emergencias.  Si tiene una emergencia mdica, por favor llame inmediatamente al 911 o vaya a la sala de emergencias.  Nmeros de bper  - Dr. Kowalski: [phone]  - Dra. Moye: [phone]  - Dra. Stewart: [phone]  En caso de inclemencias del tiempo, por favor llame a nuestra lnea principal al [phone] para una actualizacin sobre el estado de cualquier retraso o cierre.  Consejos para la medicacin en dermatologa: Por favor, guarde las cajas en las que vienen los medicamentos de uso tpico para ayudarle a seguir las instrucciones sobre dnde y cmo usarlos. Las farmacias generalmente imprimen las instrucciones del medicamento slo en las cajas y no directamente en los tubos del medicamento.   Si su medicamento es muy caro, por favor, pngase en contacto con   nuestra oficina llamando al [phone] y presione la opcin 4 o envenos un mensaje a travs de MyChart.   No podemos decirle cul ser su copago por los medicamentos por adelantado ya que esto es diferente dependiendo de la cobertura de su seguro. Sin embargo, es posible que podamos encontrar un medicamento sustituto a menor costo o llenar un formulario para que el seguro cubra el medicamento que se considera necesario.   Si se requiere una autorizacin previa para que su compaa de seguros cubra su medicamento, por favor permtanos de 1 a 2 das hbiles para completar este proceso.  Los precios de los medicamentos varan con frecuencia dependiendo del lugar de dnde se surte la receta y alguna farmacias pueden ofrecer precios ms baratos.  El sitio web www.goodrx.com tiene cupones para medicamentos de diferentes farmacias. Los precios aqu no tienen en cuenta lo que podra costar con la ayuda del seguro (puede ser ms barato con su seguro), pero el sitio web puede darle el  precio si no utiliz ningn seguro.  - Puede imprimir el cupn correspondiente y llevarlo con su receta a la farmacia.  - Tambin puede pasar por nuestra oficina durante el horario de atencin regular y recoger una tarjeta de cupones de GoodRx.  - Si necesita que su receta se enve electrnicamente a una farmacia diferente, informe a nuestra oficina a travs de MyChart de [place] o por telfono llamando al [phone] y presione la opcin 4.  

## 2023-01-14 NOTE — Progress Notes (Signed)
New Patient Visit   Subjective  Jesse Wade is a 56 y.o. male who presents for the following: itchy rash at scalp, elbows, knees, neck, chin and face. Spots come up as a little blister sometimes. Patient saw Dr. Cheree Ditto a few months ago and she did not give him a diagnosis, gave him clobetasol which seems to help. Also saw PCP 7/3 and they gave him permethrin to treat for scabies. Patient did one treatment. Wife does not have any similar rash. She does have an occasional bug bite.   Patient accompanied by wife who contributes to history.   No other health changes. Stress at work. Itch does not wake patient up during the night. Patient has seen allergist and does have allergies to dogs, cats.   The following portions of the chart were reviewed this encounter and updated as appropriate: medications, allergies, medical history  Review of Systems:  No other skin or systemic complaints except as noted in HPI or Assessment and Plan.  Objective  Well appearing patient in no apparent distress; mood and affect are within normal limits.   A focused examination was performed of the following areas: Scalp, neck, arms, hands, legs, trunk  Relevant exam findings are noted in the Assessment and Plan.  left elbow - A, left elbow - B Excoriated pink inflamed papules on b/l elbows, R > L knee, right neck, posterior scalp                 Assessment & Plan     Rash left elbow - B; left elbow - A  Distribution, appearance, and symptoms of lesions raise concern for  dermatitis herpetiformis. Briefly discussed condition and its relation to Celiac disease. No GI symptoms suggestive of Celiac. Benign acantholytic dermatosis is unlikely given distribution. Differential includes linear IgA vs bullous pemphigoid vs arthropod bite vs scabies vs eczema. Suspicion for scabies is low and patient is s/p permethrin x 1, but empirically covering for scabies with ivermectin. Potential side effects  of ivermectin include headache, dizziness, swelling, tachycardia. Performing biopsy for H&E to further characterize and DIF to rule out DH vs other bullous disorder.  Start ivermectin 3 mg 11 po every day repeat 14 days later.  Continue clobetasol BID on lesions for pruritus  Skin / nail biopsy - left elbow - A Type of biopsy: punch   Informed consent: discussed and consent obtained   Timeout: patient name, date of birth, surgical site, and procedure verified   Procedure prep:  Patient was prepped and draped in usual sterile fashion (the patient was cleaned and prepped) Prep type:  Isopropyl alcohol Anesthesia: the lesion was anesthetized in a standard fashion   Anesthetic:  1% lidocaine w/ epinephrine 1-100,000 buffered w/ 8.4% NaHCO3 Punch size:  4 mm Suture size:  4-0 Suture type: nylon   Suture removal (days):  7 Hemostasis achieved with: suture, pressure and aluminum chloride   Outcome: patient tolerated procedure well   Post-procedure details: sterile dressing applied and wound care instructions given   Dressing type: bandage, petrolatum and pressure dressing    Skin / nail biopsy - left elbow - B Type of biopsy: punch   Informed consent: discussed and consent obtained   Timeout: patient name, date of birth, surgical site, and procedure verified   Procedure prep:  Patient was prepped and draped in usual sterile fashion (the patient was cleaned and prepped) Prep type:  Isopropyl alcohol Anesthesia: the lesion was anesthetized in a standard fashion  Anesthetic:  1% lidocaine w/ epinephrine 1-100,000 buffered w/ 8.4% NaHCO3 Punch size:  4 mm Suture size:  4-0 Suture type: nylon   Suture removal (days):  7 Hemostasis achieved with: suture, pressure and aluminum chloride   Outcome: patient tolerated procedure well   Post-procedure details: sterile dressing applied and wound care instructions given   Dressing type: bandage, petrolatum and pressure dressing    Specimen 1 -  Surgical pathology Differential Diagnosis: R/o dermatitis herpetiformis vs linear IgA vs bullous pemphigoid vs arthropod bite vs scabies vs eczema  Check Margins: No Excoriated pink inflamed papules on b/l elbows, R > L knee, right neck, posterior scalp  Specimen 2 - Surgical pathology Differential Diagnosis: R/o dermatitis herpetiformis vs linear IgA vs bullous pemphigoid vs arthropod bite vs scabies vs eczema  Check Margins: No Excoriated pink inflamed papules on b/l elbows, R > L knee, right neck, posterior scalp DIF    Return in about 1 week (around 01/21/2023) for Suture Removal.  Anise Salvo, RMA, am acting as scribe for Elie Goody, MD .   Documentation: I have reviewed the above documentation for accuracy and completeness, and I agree with the above.  Elie Goody, MD

## 2023-01-20 ENCOUNTER — Telehealth: Payer: Self-pay | Admitting: Dermatology

## 2023-01-20 ENCOUNTER — Encounter: Payer: Self-pay | Admitting: Dermatology

## 2023-01-20 DIAGNOSIS — L130 Dermatitis herpetiformis: Secondary | ICD-10-CM | POA: Insufficient documentation

## 2023-01-20 DIAGNOSIS — Z79899 Other long term (current) drug therapy: Secondary | ICD-10-CM

## 2023-01-20 NOTE — Telephone Encounter (Addendum)
Discussed diagnosis with patient by phone. Answered all questions  DH is a rash associated with underlying celiac disease. Celiac disease is a gluten-sensitive enteropathy, which can occur despite a lack of GI symptoms. Avoidance of gluten is important to minimize the risk of enteropathy-associated T-cell lymphoma. Avoidance of gluten can control DH, but it can take months or over a year for the rash to regress with dietary changes alone. Given that patient has failed clobetasol, systemic treatment is warranted.  GI referral  - to evaluate for celiac disease - for colonoscopy (patient has never received one) - patient had no preference on which GI to see so sending to the closest one - Ambulatory referral to Gastroenterology  Treatment of DH Discussed dapsone for [facility], antibiotic with anti-inflammatory properties, not immunosuppressive, safe to take for long periods if tolerated, starts working within a few days, start 25 mg daily and increase by 25 mg every 1-2 weeks, need labs weekly for a month, every other week for 2 months,  then every 3 months. Potential side effects  Dermatologic: drug rash, DRESS TEN Hematologic: Agranulocytosis, Aplastic anemia, Hemolytic anemia, Methemoglobinemia Hepatic: Toxic hepatitis Neurologic: Peripheral motor neuropathy  Labs for dapsone initiation - CBC with Differential/Platelets - Glucose 6 phosphate dehydrogenase - CMP - Urinalysis

## 2023-01-21 ENCOUNTER — Ambulatory Visit (INDEPENDENT_AMBULATORY_CARE_PROVIDER_SITE_OTHER): Payer: BC Managed Care – PPO

## 2023-01-21 ENCOUNTER — Ambulatory Visit: Payer: BC Managed Care – PPO | Admitting: Dermatology

## 2023-01-21 DIAGNOSIS — Z79899 Other long term (current) drug therapy: Secondary | ICD-10-CM | POA: Diagnosis not present

## 2023-01-21 DIAGNOSIS — L130 Dermatitis herpetiformis: Secondary | ICD-10-CM | POA: Diagnosis not present

## 2023-01-21 NOTE — Progress Notes (Signed)
Encounter for Removal of Sutures - Incision site at the left elbow is clean, dry and intact - Wound cleansed, sutures removed, wound cleansed and steri strips applied.  - Patient advised to keep steri-strips dry until they fall off. - Scars remodel for a full year. - Once steri-strips fall off, patient can apply over-the-counter silicone scar cream each night to help with scar remodeling if desired. - Patient advised to call with any concerns or if they notice any new or changing lesions.  Dorathy Daft, RMA

## 2023-01-26 ENCOUNTER — Ambulatory Visit: Payer: BC Managed Care – PPO | Admitting: Dermatology

## 2023-01-26 ENCOUNTER — Encounter: Payer: Self-pay | Admitting: Dermatology

## 2023-01-26 VITALS — BP 125/68 | HR 63

## 2023-01-26 DIAGNOSIS — B351 Tinea unguium: Secondary | ICD-10-CM | POA: Diagnosis not present

## 2023-01-26 DIAGNOSIS — L130 Dermatitis herpetiformis: Secondary | ICD-10-CM

## 2023-01-26 DIAGNOSIS — Z79899 Other long term (current) drug therapy: Secondary | ICD-10-CM | POA: Diagnosis not present

## 2023-01-26 DIAGNOSIS — B353 Tinea pedis: Secondary | ICD-10-CM

## 2023-01-26 MED ORDER — DAPSONE 25 MG PO TABS: 25.0000 mg | ORAL_TABLET | Freq: Every day | ORAL | 0 refills | Status: DC

## 2023-01-26 NOTE — Progress Notes (Signed)
   Follow-Up Visit   Subjective  Jesse Wade is a 57 y.o. male who presents for the following: Toenails. C/O discoloration, thickening. Has had oral treatment in the past. Tolerated well, no adverse reactions. Has had toenails fall off recently secondary to trauma.   Recheck dermatitis herpetiformis. Discuss starting oral Dapsone today. Referral has been placed to GI. Patient is waiting for call to schedule appointment. Labs results resulted from [facility] 01/21/2023. Patient states he was advised [facility] does not get urine samples for urinalysis.   The patient has spots, moles and lesions to be evaluated, some may be new or changing and the patient may have concern these could be cancer.  Wife is with patient and contributes to history.   The following portions of the chart were reviewed this encounter and updated as appropriate: medications, allergies, medical history  Review of Systems:  No other skin or systemic complaints except as noted in HPI or Assessment and Plan.  Objective  Well appearing patient in no apparent distress; mood and affect are within normal limits.  A focused examination was performed of the following areas: Toenails  Relevant exam findings are noted in the Assessment and Plan.  scalp, hands, arms, legs Excoriated pink inflamed papules on b/l elbows, b/l knees, right neck, posterior scalp, fingers, dorsal hands, lower legs. Interval increase in number of lesions  L great toenail, L-2, L-5 toenails Thickened toenails with subungal debris c/w onychomycosis     Assessment & Plan    Dermatitis herpetiformis scalp, hands, arms, legs  Chronic condition, flaring, not at patient goal  Start Dapsone 25 mg once daily.  Reviewed potential side effects Repeat labs every week for 1 month.   Related Procedures Comprehensive metabolic panel CBC with Differential/Platelet  Onychomycosis L great toenail, L-2, L-5 toenails  Recommend holding treatment for  now. Prefers to start Dapsone for dermatitis herpetiformis.   High risk medication use  Related Procedures Comprehensive metabolic panel CBC with Differential/Platelet  TINEA PEDIS and onychomycosis Exam: Scaling and maceration web spaces and over distal and lateral soles.  Treatment Plan: Discussed that condition is persistent, recurrent, and difficult to treat. Fungus does not cause internal infection and is not dangerous to overall health. Recommend prioritizing DH treatment with dapsone before considering courses of terbinafine Start Lamisil cream twice daily to both feet and between toes until clear then twice a week for maintenance.     Return in about 1 month (around 02/26/2023) for Rash Follow Up.  I, Lawson Radar, CMA, am acting as scribe for Elie Goody, MD.   Documentation: I have reviewed the above documentation for accuracy and completeness, and I agree with the above.  Elie Goody, MD

## 2023-01-26 NOTE — Patient Instructions (Addendum)
Start Lamisil cream twice daily to both feet and between toes until clear then twice a week for maintenance.    Start Dapsone 25 mg once daily.   Repeat labs every week for 1 month.    Due to recent changes in healthcare laws, you may see results of your pathology and/or laboratory studies on MyChart before the doctors have had a chance to review them. We understand that in some cases there may be results that are confusing or concerning to you. Please understand that not all results are received at the same time and often the doctors may need to interpret multiple results in order to provide you with the best plan of care or course of treatment. Therefore, we ask that you please give [place] 2 business days to thoroughly review all your results before contacting the office for clarification. Should we see a critical lab result, you will be contacted sooner.   If You Need Anything After Your Visit  If you have any questions or concerns for your doctor, please call our main line at [phone] and press option 4 to reach your doctor's medical assistant. If no one answers, please leave a voicemail as directed and we will return your call as soon as possible. Messages left after 4 pm will be answered the following business day.   You may also send [place] a message via MyChart. We typically respond to MyChart messages within 1-2 business days.  For prescription refills, please ask your pharmacy to contact our office. Our fax number is [phone].  If you have an urgent issue when the clinic is closed that cannot wait until the next business day, you can page your doctor at the number below.    Please note that while we do our best to be available for urgent issues outside of office hours, we are not available 24/7.   If you have an urgent issue and are unable to reach [place], you may choose to seek medical care at your doctor's office, retail clinic, urgent care center, or emergency room.  If you have a  medical emergency, please immediately call 911 or go to the emergency department.  Pager Numbers  - Dr. Gwen Pounds: [phone]  - Dr. Roseanne Reno: [phone]  In the event of inclement weather, please call our main line at [phone] for an update on the status of any delays or closures.  Dermatology Medication Tips: Please keep the boxes that topical medications come in in order to help keep track of the instructions about where and how to use these. Pharmacies typically print the medication instructions only on the boxes and not directly on the medication tubes.   If your medication is too expensive, please contact our office at [phone] option 4 or send [place] a message through MyChart.   We are unable to tell what your co-pay for medications will be in advance as this is different depending on your insurance coverage. However, we may be able to find a substitute medication at lower cost or fill out paperwork to get insurance to cover a needed medication.   If a prior authorization is required to get your medication covered by your insurance company, please allow [place] 1-2 business days to complete this process.  Drug prices often vary depending on where the prescription is filled and some pharmacies may offer cheaper prices.  The website www.goodrx.com contains coupons for medications through different pharmacies. The prices here do not account for what the cost may be with help from  insurance (it may be cheaper with your insurance), but the website can give you the price if you did not use any insurance.  - You can print the associated coupon and take it with your prescription to the pharmacy.  - You may also stop by our office during regular business hours and pick up a GoodRx coupon card.  - If you need your prescription sent electronically to a different pharmacy, notify our office through [facility] or by phone at [phone] option 4.     Si Usted Necesita Algo Despus  de Su Visita  Tambin puede enviarnos un mensaje a travs de Clinical cytogeneticist. Por lo general respondemos a los mensajes de MyChart en el transcurso de 1 a 2 das hbiles.  Para renovar recetas, por favor pida a su farmacia que se ponga en contacto con nuestra oficina. Annie Sable de fax es [place] [phone].  Si tiene un asunto urgente cuando la clnica est cerrada y que no puede esperar hasta el siguiente da hbil, puede llamar/localizar a su doctor(a) al nmero que aparece a continuacin.   Por favor, tenga en cuenta que aunque hacemos todo lo posible para estar disponibles para asuntos urgentes fuera del horario de [place], no estamos disponibles las 24 horas del da, los 7 [address]  [address] de la [place].   Si tiene un problema urgente y no puede comunicarse con nosotros, puede optar por buscar atencin mdica  en el consultorio de su doctor(a), en una clnica privada, en un centro de atencin urgente o en una sala de emergencias.  Si tiene Engineer, drilling, por favor llame inmediatamente al 911 o vaya a la sala de emergencias.  Nmeros de bper  - Dr. Gwen Pounds: [phone]  - Dra. Roseanne Reno: [phone]  En caso de inclemencias del [place], por favor llame a Lacy Duverney principal al [phone] para una actualizacin sobre el [place] de cualquier retraso o cierre.  Consejos para la medicacin en dermatologa: Por favor, guarde las cajas en las que vienen los medicamentos de uso tpico para ayudarle a seguir las instrucciones sobre dnde y cmo usarlos. Las farmacias generalmente imprimen las instrucciones del medicamento slo en las cajas y no directamente en los tubos del [place].   Si su medicamento es muy caro, por favor, pngase en contacto con Rolm Gala llamando al [phone] y presione la opcin 4 o envenos un mensaje a travs de Clinical cytogeneticist.   No podemos decirle cul ser su copago por los medicamentos por adelantado ya que esto es diferente dependiendo de la cobertura de su  seguro. Sin embargo, es posible que podamos encontrar un medicamento sustituto a Audiological scientist un formulario para que el seguro cubra el medicamento que se considera necesario.   Si se requiere una autorizacin previa para que su compaa de seguros [place] su medicamento, por favor permtanos de 1 a 2 das hbiles para completar [address]  Los precios de los medicamentos varan con frecuencia dependiendo del Environmental consultant de dnde se surte la receta y alguna farmacias pueden ofrecer precios ms baratos.  El sitio web www.goodrx.com tiene cupones para medicamentos de Health and safety inspector. Los precios aqu no tienen en cuenta lo que podra costar con la ayuda del seguro (puede ser ms barato con su seguro), pero el sitio web puede darle el precio si no utiliz Tourist information centre manager.  - Puede imprimir el cupn correspondiente y llevarlo con su receta a la farmacia.  - Tambin puede pasar por nuestra oficina durante el horario de atencin regular y Librarian, academic  tarjeta de cupones de GoodRx.  - Si necesita que su receta se enve electrnicamente a una farmacia diferente, informe a nuestra oficina a travs de MyChart de [place] o por telfono llamando al [phone] y presione la opcin 4.

## 2023-01-28 DIAGNOSIS — L130 Dermatitis herpetiformis: Secondary | ICD-10-CM | POA: Diagnosis not present

## 2023-01-28 DIAGNOSIS — Z79899 Other long term (current) drug therapy: Secondary | ICD-10-CM | POA: Diagnosis not present

## 2023-02-02 ENCOUNTER — Encounter: Payer: Self-pay | Admitting: Dermatology

## 2023-02-02 ENCOUNTER — Telehealth: Payer: Self-pay | Admitting: Dermatology

## 2023-02-02 NOTE — Telephone Encounter (Signed)
Tried to call patient to get update on dapsone and remind about getting labs, but it went straight to voicemail

## 2023-02-03 ENCOUNTER — Other Ambulatory Visit: Payer: Self-pay | Admitting: Dermatology

## 2023-02-03 DIAGNOSIS — Z79899 Other long term (current) drug therapy: Secondary | ICD-10-CM

## 2023-02-04 DIAGNOSIS — Z79899 Other long term (current) drug therapy: Secondary | ICD-10-CM | POA: Diagnosis not present

## 2023-02-05 ENCOUNTER — Other Ambulatory Visit: Payer: Self-pay | Admitting: Dermatology

## 2023-02-05 DIAGNOSIS — L130 Dermatitis herpetiformis: Secondary | ICD-10-CM

## 2023-02-05 DIAGNOSIS — Z79899 Other long term (current) drug therapy: Secondary | ICD-10-CM

## 2023-02-05 LAB — CBC WITH DIFFERENTIAL/PLATELET
Basophils Absolute: 0.1 10*3/uL (ref 0.0–0.2)
Basos: 1 %
EOS (ABSOLUTE): 0.2 10*3/uL (ref 0.0–0.4)
Eos: 3 %
Hematocrit: 41.9 % (ref 37.5–51.0)
Hemoglobin: 14.6 g/dL (ref 13.0–17.7)
Immature Grans (Abs): 0 10*3/uL (ref 0.0–0.1)
Immature Granulocytes: 0 %
Lymphocytes Absolute: 2 10*3/uL (ref 0.7–3.1)
Lymphs: 28 %
MCH: 30.7 pg (ref 26.6–33.0)
MCHC: 34.8 g/dL (ref 31.5–35.7)
MCV: 88 fL (ref 79–97)
Monocytes Absolute: 0.7 10*3/uL (ref 0.1–0.9)
Monocytes: 10 %
Neutrophils Absolute: 4.1 10*3/uL (ref 1.4–7.0)
Neutrophils: 58 %
Platelets: 242 10*3/uL (ref 150–450)
RBC: 4.76 x10E6/uL (ref 4.14–5.80)
RDW: 12 % (ref 11.6–15.4)
WBC: 7.1 10*3/uL (ref 3.4–10.8)

## 2023-02-05 LAB — COMPREHENSIVE METABOLIC PANEL
ALT: 19 IU/L (ref 0–44)
AST: 17 IU/L (ref 0–40)
Albumin: 4.3 g/dL (ref 3.8–4.9)
Alkaline Phosphatase: 67 IU/L (ref 44–121)
BUN/Creatinine Ratio: 22 — ABNORMAL HIGH (ref 9–20)
BUN: 24 mg/dL (ref 6–24)
Bilirubin Total: 0.6 mg/dL (ref 0.0–1.2)
CO2: 24 mmol/L (ref 20–29)
Calcium: 9.2 mg/dL (ref 8.7–10.2)
Chloride: 107 mmol/L — ABNORMAL HIGH (ref 96–106)
Creatinine, Ser: 1.09 mg/dL (ref 0.76–1.27)
Globulin, Total: 2.2 g/dL (ref 1.5–4.5)
Glucose: 99 mg/dL (ref 70–99)
Potassium: 4.1 mmol/L (ref 3.5–5.2)
Sodium: 142 mmol/L (ref 134–144)
Total Protein: 6.5 g/dL (ref 6.0–8.5)
eGFR: 80 mL/min/{1.73_m2} (ref >59)

## 2023-02-05 MED ORDER — DAPSONE 25 MG PO TABS: 50.0000 mg | ORAL_TABLET | Freq: Every day | ORAL | 0 refills | Status: AC

## 2023-02-10 ENCOUNTER — Encounter: Payer: Self-pay | Admitting: Dermatology

## 2023-02-16 DIAGNOSIS — L130 Dermatitis herpetiformis: Secondary | ICD-10-CM | POA: Diagnosis not present

## 2023-02-16 DIAGNOSIS — Z79899 Other long term (current) drug therapy: Secondary | ICD-10-CM | POA: Diagnosis not present

## 2023-02-17 ENCOUNTER — Telehealth: Payer: Self-pay | Admitting: Dermatology

## 2023-02-17 DIAGNOSIS — Z79899 Other long term (current) drug therapy: Secondary | ICD-10-CM

## 2023-02-17 LAB — COMPREHENSIVE METABOLIC PANEL
ALT: 25 IU/L (ref 0–44)
AST: 18 IU/L (ref 0–40)
Albumin: 4.3 g/dL (ref 3.8–4.9)
Alkaline Phosphatase: 71 IU/L (ref 44–121)
BUN/Creatinine Ratio: 14 (ref 9–20)
BUN: 15 mg/dL (ref 6–24)
Bilirubin Total: 0.4 mg/dL (ref 0.0–1.2)
CO2: 25 mmol/L (ref 20–29)
Calcium: 8.8 mg/dL (ref 8.7–10.2)
Chloride: 106 mmol/L (ref 96–106)
Creatinine, Ser: 1.05 mg/dL (ref 0.76–1.27)
Globulin, Total: 2 g/dL (ref 1.5–4.5)
Glucose: 97 mg/dL (ref 70–99)
Potassium: 4.2 mmol/L (ref 3.5–5.2)
Sodium: 143 mmol/L (ref 134–144)
Total Protein: 6.3 g/dL (ref 6.0–8.5)
eGFR: 83 mL/min/{1.73_m2} (ref >59)

## 2023-02-17 LAB — CBC WITH DIFFERENTIAL/PLATELET
Basophils Absolute: 0 10*3/uL (ref 0.0–0.2)
Basos: 1 %
EOS (ABSOLUTE): 0.2 10*3/uL (ref 0.0–0.4)
Eos: 4 %
Hematocrit: 40.2 % (ref 37.5–51.0)
Hemoglobin: 13.8 g/dL (ref 13.0–17.7)
Immature Grans (Abs): 0 10*3/uL (ref 0.0–0.1)
Immature Granulocytes: 0 %
Lymphocytes Absolute: 1.6 10*3/uL (ref 0.7–3.1)
Lymphs: 30 %
MCH: 30.9 pg (ref 26.6–33.0)
MCHC: 34.3 g/dL (ref 31.5–35.7)
MCV: 90 fL (ref 79–97)
Monocytes Absolute: 0.5 10*3/uL (ref 0.1–0.9)
Monocytes: 10 %
Neutrophils Absolute: 3 10*3/uL (ref 1.4–7.0)
Neutrophils: 55 %
Platelets: 223 10*3/uL (ref 150–450)
RBC: 4.46 x10E6/uL (ref 4.14–5.80)
RDW: 12.3 % (ref 11.6–15.4)
WBC: 5.4 10*3/uL (ref 3.4–10.8)

## 2023-02-17 NOTE — Telephone Encounter (Signed)
Dapsone labs

## 2023-02-18 DIAGNOSIS — J3089 Other allergic rhinitis: Secondary | ICD-10-CM | POA: Diagnosis not present

## 2023-02-18 DIAGNOSIS — J3081 Allergic rhinitis due to animal (cat) (dog) hair and dander: Secondary | ICD-10-CM | POA: Diagnosis not present

## 2023-02-24 ENCOUNTER — Encounter: Payer: Self-pay | Admitting: Dermatology

## 2023-02-26 DIAGNOSIS — Z79899 Other long term (current) drug therapy: Secondary | ICD-10-CM | POA: Diagnosis not present

## 2023-02-27 LAB — COMPREHENSIVE METABOLIC PANEL
ALT: 22 IU/L (ref 0–44)
AST: 19 IU/L (ref 0–40)
Albumin: 4.2 g/dL (ref 3.8–4.9)
Alkaline Phosphatase: 81 IU/L (ref 44–121)
BUN/Creatinine Ratio: 17 (ref 9–20)
BUN: 16 mg/dL (ref 6–24)
Bilirubin Total: 0.4 mg/dL (ref 0.0–1.2)
CO2: 24 mmol/L (ref 20–29)
Calcium: 8.9 mg/dL (ref 8.7–10.2)
Chloride: 107 mmol/L — ABNORMAL HIGH (ref 96–106)
Creatinine, Ser: 0.92 mg/dL (ref 0.76–1.27)
Globulin, Total: 2 g/dL (ref 1.5–4.5)
Glucose: 111 mg/dL — ABNORMAL HIGH (ref 70–99)
Potassium: 4.4 mmol/L (ref 3.5–5.2)
Sodium: 145 mmol/L — ABNORMAL HIGH (ref 134–144)
Total Protein: 6.2 g/dL (ref 6.0–8.5)
eGFR: 98 mL/min/{1.73_m2} (ref >59)

## 2023-02-27 LAB — CBC WITH DIFFERENTIAL/PLATELET
Basophils Absolute: 0 10*3/uL (ref 0.0–0.2)
Basos: 1 %
EOS (ABSOLUTE): 0.2 10*3/uL (ref 0.0–0.4)
Eos: 4 %
Hematocrit: 39.9 % (ref 37.5–51.0)
Hemoglobin: 13.6 g/dL (ref 13.0–17.7)
Immature Grans (Abs): 0 10*3/uL (ref 0.0–0.1)
Immature Granulocytes: 0 %
Lymphocytes Absolute: 1.8 10*3/uL (ref 0.7–3.1)
Lymphs: 31 %
MCH: 30.4 pg (ref 26.6–33.0)
MCHC: 34.1 g/dL (ref 31.5–35.7)
MCV: 89 fL (ref 79–97)
Monocytes Absolute: 0.5 10*3/uL (ref 0.1–0.9)
Monocytes: 8 %
Neutrophils Absolute: 3.3 10*3/uL (ref 1.4–7.0)
Neutrophils: 56 %
Platelets: 207 10*3/uL (ref 150–450)
RBC: 4.47 x10E6/uL (ref 4.14–5.80)
RDW: 12.3 % (ref 11.6–15.4)
WBC: 5.8 10*3/uL (ref 3.4–10.8)

## 2023-03-01 ENCOUNTER — Encounter: Payer: Self-pay | Admitting: Dermatology

## 2023-03-01 ENCOUNTER — Ambulatory Visit: Payer: BC Managed Care – PPO | Admitting: Dermatology

## 2023-03-01 DIAGNOSIS — L130 Dermatitis herpetiformis: Secondary | ICD-10-CM | POA: Diagnosis not present

## 2023-03-01 DIAGNOSIS — B351 Tinea unguium: Secondary | ICD-10-CM

## 2023-03-01 DIAGNOSIS — B353 Tinea pedis: Secondary | ICD-10-CM

## 2023-03-01 DIAGNOSIS — Z79899 Other long term (current) drug therapy: Secondary | ICD-10-CM

## 2023-03-01 DIAGNOSIS — Z7189 Other specified counseling: Secondary | ICD-10-CM

## 2023-03-01 MED ORDER — TERBINAFINE HCL 250 MG PO TABS: ORAL_TABLET | ORAL | 0 refills | Status: DC

## 2023-03-01 NOTE — Patient Instructions (Signed)

## 2023-03-01 NOTE — Progress Notes (Signed)
   Follow-Up Visit   Subjective  Jesse Wade is a 57 y.o. male who presents for the following: 1 month f/u on dermatitis taking Dapsone tablet twice a day with a good response and started a gluten free diet.  Patient report no side effects from taking Dapsone  The patient has spots, moles and lesions to be evaluated, some may be new or changing and the patient may have concern these could be cancer.   Wife is with patient and contributes to history.   The following portions of the chart were reviewed this encounter and updated as appropriate: medications, allergies, medical history  Review of Systems:  No other skin or systemic complaints except as noted in HPI or Assessment and Plan.  Objective  Well appearing patient in no apparent distress; mood and affect are within normal limits.  A focused examination was performed of the following areas:   Relevant exam findings are noted in the Assessment and Plan.    Assessment & Plan   Dermatitis herpetiformis - Clear on current regimen scalp, hands, arms, legs   Chronic condition, well-controlled on therapy  Continue Dapsone 25 mg twice a day.  Reviewed potential side effects Repeat labs every 2 weeks Continue avoiding gluten in diet GI first appointment scheduled 9/26  TINEA PEDIS and toenail onychomycosis Exam: Scaling and maceration web spaces and over distal and lateral soles.  Chronic condition, flaring, not at patient goal  Treatment Plan: Discussed that condition is persistent, recurrent, and difficult to treat. Fungus does not cause internal infection and is not dangerous to overall health.  Lab reviewed CMP 03/18/23 WNL  Start Terbinafine 250 mg once a day x 12 weeks then stop Continue Lamisil cream twice daily to both feet and between toes until clear then twice a week for maintenance.   Keep socks dry, don't walk around pools barefoot, allow shoes to dry out before re-wearing  Terbinafine  Counseling  Terbinafine is an anti-fungal medicine that can be applied to the skin (over the counter) or taken by mouth (prescription) to treat fungal infections. The pill version is often used to treat fungal infections of the nails or scalp. While most people do not have any side effects from taking terbinafine pills, some possible side effects of the medicine can include taste changes, headache, loss of smell, vision changes, nausea, vomiting, or diarrhea.   Rare side effects can include irritation of the liver, allergic reaction, or decrease in blood counts (which may show up as not feeling well or developing an infection). If you are concerned about any of these side effects, please stop the medicine and call your doctor, or in the case of an emergency such as feeling very unwell, seek immediate medical care.      Return in about 2 months (around 05/01/2023) for Dermatitis herpetiformis, .  I, Angelique Holm, CMA, am acting as scribe for Elie Goody, MD .   Documentation: I have reviewed the above documentation for accuracy and completeness, and I agree with the above.  Elie Goody, MD

## 2023-03-11 ENCOUNTER — Encounter: Payer: Self-pay | Admitting: Dermatology

## 2023-03-16 ENCOUNTER — Other Ambulatory Visit: Payer: Self-pay

## 2023-03-16 DIAGNOSIS — M5412 Radiculopathy, cervical region: Secondary | ICD-10-CM | POA: Insufficient documentation

## 2023-03-17 NOTE — Progress Notes (Unsigned)
Celso Amy, PA-C [address]  [address]  [place], [place] [address]  Main: [phone]  Fax: [phone]   Gastroenterology Consultation  Referring Provider:     Marjie Skiff, NP Primary Care Physician:  Marjie Skiff, NP Primary Gastroenterologist:  Celso Amy, PA-C / Dr. Midge Minium   Reason for Consultation:     Dermatitis herpetiformis        HPI:   Jesse Wade is a 57 y.o. y/o male referred for consultation & management  by Marjie Skiff, NP.    Patient was recently diagnosed and treated for dermatitis herpetiformis with dapsone twice daily and gluten-free diet.  He has had itchy rash for 1 year.  He started a gluten-free diet 03/04/2023.  Rash is improved on treatment.  Was told to follow-up with GI for further evaluation.  Patient denies any GI symptoms such as abdominal pain, diarrhea, constipation, rectal bleeding, heartburn, dysphagia, or weight loss.  No known family history of celiac.  Labs 02/26/2023 showed normal hemoglobin 13.6.   No previous celiac labs, EGD, colonoscopy or GI evaluation.  Patient's mother was diagnosed with colon cancer in her late 57s.    Past Medical History:  Diagnosis Date   Allergic rhinitis    Headache    H/O MIGRAINES   Hypertension    Sleep apnea    BIPAP OR CPAP-PT UNSURE WHICH ONE    Past Surgical History:  Procedure Laterality Date   ANKLE SURGERY Right 2014, 2015   x3   INGUINAL HERNIA REPAIR Bilateral    childhood   TONSILLECTOMY     UMBILICAL HERNIA REPAIR N/A 02/25/2016   Procedure: HERNIA REPAIR UMBILICAL ADULT;  Surgeon: Kieth Brightly, MD;  Location: [facility] ORS;  Service: General;  Laterality: N/A;    Prior to Admission medications   Medication Sig Start Date End Date Taking? Authorizing Provider  benazepril (LOTENSIN) 40 MG tablet Take 1 tablet (40 mg total) by mouth daily. 03/30/22   Cannady, Corrie Dandy T, NP  DAPSONE PO Dapsone    [provider]  gabapentin (NEURONTIN) 300 MG  capsule Take 300 mg by mouth 3 (three) times daily as needed. 10/07/22   [provider]  levocetirizine (XYZAL) 5 MG tablet Take 5 mg by mouth every evening.  05/25/15   [provider]  meloxicam (MOBIC) 15 MG tablet Take 1 tablet (15 mg total) by mouth daily. 10/16/22   Cannady, Corrie Dandy T, NP  methocarbamol (ROBAXIN) 750 MG tablet Take 750 mg by mouth every 8 (eight) hours as needed. 09/28/22   [provider]  montelukast (SINGULAIR) 10 MG tablet Take 1 tablet (10 mg total) by mouth at bedtime. 04/29/16   Particia Nearing, PA-C  omeprazole (PRILOSEC) 20 MG capsule Take 1 capsule (20 mg total) by mouth daily. 03/30/22   Aura Dials T, NP  terbinafine (LAMISIL) 250 MG tablet Take 1 tablet once a day x 12 weeks then stop 03/01/23   Elie Goody, MD    Family History  Problem Relation Age of Onset   Heart disease Mother    Alcohol abuse Father    Heart disease Maternal Aunt    Heart disease Maternal Grandmother    COPD Maternal Grandmother    Diabetes Maternal Grandfather    Cancer Neg Hx    Hypertension Neg Hx    Stroke Neg Hx      Social History   Tobacco Use   Smoking status: Never   Smokeless tobacco:  Never  Vaping Use   Vaping status: Never Used  Substance Use Topics   Alcohol use: Yes    Comment: OCC   Drug use: No    Allergies as of 03/18/2023 - Review Complete 03/18/2023  Allergen Reaction Noted   Amlodipine Other (See Comments) 06/18/2015   Penicillins Rash 06/18/2015    Review of Systems:    All systems reviewed and negative except where noted in HPI.   Physical Exam:  BP 134/76   Pulse 65   Temp 98.6 F (37 C)   Ht 6\' 6"  (1.981 m)   Wt (!) 336 lb 6.4 oz (152.6 kg)   BMI 38.87 kg/m  No LMP for male patient.  General:   Alert,  Well-developed, well-nourished, pleasant and cooperative in NAD Lungs:  Respirations even and unlabored.  Clear throughout to auscultation.   No wheezes, crackles, or rhonchi. No acute  distress. Heart:  Regular rate and rhythm; no murmurs, clicks, rubs, or gallops. Abdomen:  Normal bowel sounds.  No bruits.  Soft, and non-distended without masses, hepatosplenomegaly or hernias noted.  No Tenderness.  No guarding or rebound tenderness.    Skin: A few excoriated blistered lesions on his hands, knees, posterior neck.  Appear to be healing. Psych:  Alert and cooperative. Normal mood and affect.  Imaging Studies: No results found.  Assessment and Plan:   RYDIN BRADEN is a 57 y.o. y/o male has been referred for newly diagnosed dermatitis herpetiformis.  Improved in the past month on dapsone.  Started gluten-free diet 03/04/2023.  No previous GI evaluation.  He denies any GI symptoms such as abdominal pain, diarrhea, or anemia.  1.  Dermatitis herpetiformis - Improving  Continue followup with dermatologist  2.  Celiac disease - Newly diagnosed  Labs: Celiac Panal, Iron, Vitamin D, B12, Folate   Scheduling EGD I discussed risks of EGD with patient to include risk of bleeding, perforation, and risk of sedation.  Patient expressed understanding and agrees to proceed with EGD.   3.  Colon cancer screening - NO previous Colonoscopy  Scheduling Colonoscopy I discussed risks of colonoscopy with patient to include risk of bleeding, colon perforation, and risk of sedation.  Patient expressed understanding and agrees to proceed with colonoscopy.   4.  Family History of Colon Cancer - Mother Late 32's  He needs repeat colonoscopy every 5 years.   Follow up 4 months.  Celso Amy, PA-C

## 2023-03-18 ENCOUNTER — Telehealth: Payer: Self-pay

## 2023-03-18 ENCOUNTER — Other Ambulatory Visit: Payer: Self-pay | Admitting: Dermatology

## 2023-03-18 ENCOUNTER — Encounter: Payer: Self-pay | Admitting: Physician Assistant

## 2023-03-18 ENCOUNTER — Ambulatory Visit: Payer: BC Managed Care – PPO | Admitting: Physician Assistant

## 2023-03-18 ENCOUNTER — Other Ambulatory Visit: Payer: Self-pay

## 2023-03-18 VITALS — BP 134/76 | HR 65 | Temp 98.6°F | Ht 78.0 in | Wt 336.4 lb

## 2023-03-18 DIAGNOSIS — Z1211 Encounter for screening for malignant neoplasm of colon: Secondary | ICD-10-CM | POA: Diagnosis not present

## 2023-03-18 DIAGNOSIS — L130 Dermatitis herpetiformis: Secondary | ICD-10-CM

## 2023-03-18 DIAGNOSIS — K900 Celiac disease: Secondary | ICD-10-CM

## 2023-03-18 DIAGNOSIS — Z79899 Other long term (current) drug therapy: Secondary | ICD-10-CM

## 2023-03-18 MED ORDER — DAPSONE 25 MG PO TABS: ORAL_TABLET | ORAL | 1 refills | Status: DC

## 2023-03-18 MED ORDER — DAPSONE 25 MG PO TABS: 50.0000 mg | ORAL_TABLET | Freq: Every day | ORAL | 0 refills | Status: DC

## 2023-03-18 MED ORDER — PEG 3350-KCL-NA BICARB-NACL 420 G PO SOLR: 4000.0000 mL | Freq: Once | ORAL | 0 refills | Status: AC

## 2023-03-18 NOTE — Telephone Encounter (Signed)
Dapsone 25 mg #30 1 refill sent to [facility] in [place], patient aware.

## 2023-03-18 NOTE — Telephone Encounter (Signed)
Patient called requesting a refill of Dapsone tablets, patient going to have his labs drawn next week, he report he had some labs drawn today by another doctor, he is not sure if the labs he had drawn today Dr Katrinka Blazing can use

## 2023-03-21 LAB — CELIAC AB TTG DGP TIGA
Antigliadin Abs, IgA: 55 U — ABNORMAL HIGH (ref 0–19)
Gliadin IgG: 26 U — ABNORMAL HIGH (ref 0–19)
IgA/Immunoglobulin A, Serum: 107 mg/dL (ref 90–386)
Tissue Transglut Ab: 2 U/mL (ref 0–5)
Transglutaminase IgA: 4 U/mL — ABNORMAL HIGH (ref 0–3)

## 2023-03-21 LAB — IRON: Iron: 104 ug/dL (ref 38–169)

## 2023-03-21 LAB — B12 AND FOLATE PANEL
Folate: 4.1 ng/mL (ref >3.0)
Vitamin B-12: 401 pg/mL (ref 232–1245)

## 2023-03-21 LAB — VITAMIN D 25 HYDROXY (VIT D DEFICIENCY, FRACTURES): Vit D, 25-Hydroxy: 28.8 ng/mL — ABNORMAL LOW (ref 30.0–100.0)

## 2023-03-23 DIAGNOSIS — L130 Dermatitis herpetiformis: Secondary | ICD-10-CM | POA: Diagnosis not present

## 2023-03-23 DIAGNOSIS — Z79899 Other long term (current) drug therapy: Secondary | ICD-10-CM | POA: Diagnosis not present

## 2023-03-24 LAB — CBC WITH DIFFERENTIAL/PLATELET
Basophils Absolute: 0 10*3/uL (ref 0.0–0.2)
Basos: 0 %
EOS (ABSOLUTE): 0.2 10*3/uL (ref 0.0–0.4)
Eos: 3 %
Hematocrit: 42.7 % (ref 37.5–51.0)
Hemoglobin: 14.2 g/dL (ref 13.0–17.7)
Immature Grans (Abs): 0 10*3/uL (ref 0.0–0.1)
Immature Granulocytes: 0 %
Lymphocytes Absolute: 2 10*3/uL (ref 0.7–3.1)
Lymphs: 29 %
MCH: 30.7 pg (ref 26.6–33.0)
MCHC: 33.3 g/dL (ref 31.5–35.7)
MCV: 92 fL (ref 79–97)
Monocytes Absolute: 0.6 10*3/uL (ref 0.1–0.9)
Monocytes: 8 %
Neutrophils Absolute: 4.2 10*3/uL (ref 1.4–7.0)
Neutrophils: 60 %
Platelets: 236 10*3/uL (ref 150–450)
RBC: 4.62 x10E6/uL (ref 4.14–5.80)
RDW: 12.1 % (ref 11.6–15.4)
WBC: 7.1 10*3/uL (ref 3.4–10.8)

## 2023-03-24 LAB — COMPREHENSIVE METABOLIC PANEL
ALT: 20 [IU]/L (ref 0–44)
AST: 20 [IU]/L (ref 0–40)
Albumin: 4.4 g/dL (ref 3.8–4.9)
Alkaline Phosphatase: 66 [IU]/L (ref 44–121)
BUN/Creatinine Ratio: 18 (ref 9–20)
BUN: 18 mg/dL (ref 6–24)
Bilirubin Total: 0.5 mg/dL (ref 0.0–1.2)
CO2: 19 mmol/L — ABNORMAL LOW (ref 20–29)
Calcium: 9.3 mg/dL (ref 8.7–10.2)
Chloride: 104 mmol/L (ref 96–106)
Creatinine, Ser: 0.98 mg/dL (ref 0.76–1.27)
Globulin, Total: 2.2 g/dL (ref 1.5–4.5)
Glucose: 92 mg/dL (ref 70–99)
Potassium: 3.9 mmol/L (ref 3.5–5.2)
Sodium: 141 mmol/L (ref 134–144)
Total Protein: 6.6 g/dL (ref 6.0–8.5)
eGFR: 91 mL/min/{1.73_m2} (ref >59)

## 2023-03-25 ENCOUNTER — Other Ambulatory Visit: Payer: Self-pay | Admitting: Dermatology

## 2023-03-25 DIAGNOSIS — Z79899 Other long term (current) drug therapy: Secondary | ICD-10-CM

## 2023-04-02 ENCOUNTER — Other Ambulatory Visit: Payer: Self-pay | Admitting: Dermatology

## 2023-04-02 DIAGNOSIS — Z79899 Other long term (current) drug therapy: Secondary | ICD-10-CM

## 2023-04-02 DIAGNOSIS — L130 Dermatitis herpetiformis: Secondary | ICD-10-CM

## 2023-04-04 ENCOUNTER — Other Ambulatory Visit: Payer: Self-pay | Admitting: Nurse Practitioner

## 2023-04-04 DIAGNOSIS — K2100 Gastro-esophageal reflux disease with esophagitis, without bleeding: Secondary | ICD-10-CM

## 2023-04-05 ENCOUNTER — Encounter: Payer: BC Managed Care – PPO | Admitting: Nurse Practitioner

## 2023-04-05 ENCOUNTER — Other Ambulatory Visit: Payer: Self-pay | Admitting: Dermatology

## 2023-04-05 DIAGNOSIS — L130 Dermatitis herpetiformis: Secondary | ICD-10-CM

## 2023-04-05 DIAGNOSIS — Z79899 Other long term (current) drug therapy: Secondary | ICD-10-CM

## 2023-04-05 MED ORDER — DAPSONE 25 MG PO TABS: ORAL_TABLET | ORAL | 0 refills | Status: DC

## 2023-04-05 NOTE — Telephone Encounter (Signed)
Requested Prescriptions  Pending Prescriptions Disp Refills   omeprazole (PRILOSEC) 20 MG capsule [Pharmacy Med Name: OMEPRAZOLE DR 20 MG CAPSULE] 90 capsule 4    Sig: TAKE 1 CAPSULE BY MOUTH EVERY DAY     Gastroenterology: Proton Pump Inhibitors Passed - 04/04/2023  1:44 AM      Passed - Valid encounter within last 12 months    Recent Outpatient Visits           3 months ago Rash and nonspecific skin eruption   [place] [facility] Mecum, Erin E, PA-C   8 months ago Eustachian tube dysfunction, left   [place] [facility] [place], Pala T, NP   1 year ago Morbid obesity (HCC)   [place] [facility] Champlin, Corrie Dandy T, NP   1 year ago Dysfunction of left eustachian tube   [place] [facility] McElwee, Lauren A, NP   1 year ago Primary hypertension   [place] [facility] Vigg, Avanti, MD       Future Appointments             In 4 weeks Elie Goody, MD [facility]

## 2023-04-08 DIAGNOSIS — Z79899 Other long term (current) drug therapy: Secondary | ICD-10-CM | POA: Diagnosis not present

## 2023-04-09 LAB — CBC WITH DIFFERENTIAL/PLATELET
Basophils Absolute: 0 10*3/uL (ref 0.0–0.2)
Basos: 1 %
EOS (ABSOLUTE): 0.2 10*3/uL (ref 0.0–0.4)
Eos: 3 %
Hematocrit: 40.6 % (ref 37.5–51.0)
Hemoglobin: 13.6 g/dL (ref 13.0–17.7)
Immature Grans (Abs): 0 10*3/uL (ref 0.0–0.1)
Immature Granulocytes: 0 %
Lymphocytes Absolute: 1.2 10*3/uL (ref 0.7–3.1)
Lymphs: 18 %
MCH: 31.2 pg (ref 26.6–33.0)
MCHC: 33.5 g/dL (ref 31.5–35.7)
MCV: 93 fL (ref 79–97)
Monocytes Absolute: 0.6 10*3/uL (ref 0.1–0.9)
Monocytes: 9 %
Neutrophils Absolute: 4.6 10*3/uL (ref 1.4–7.0)
Neutrophils: 69 %
Platelets: 203 10*3/uL (ref 150–450)
RBC: 4.36 x10E6/uL (ref 4.14–5.80)
RDW: 11.6 % (ref 11.6–15.4)
WBC: 6.6 10*3/uL (ref 3.4–10.8)

## 2023-04-09 LAB — COMPREHENSIVE METABOLIC PANEL
ALT: 18 [IU]/L (ref 0–44)
AST: 16 [IU]/L (ref 0–40)
Albumin: 4.3 g/dL (ref 3.8–4.9)
Alkaline Phosphatase: 70 [IU]/L (ref 44–121)
BUN/Creatinine Ratio: 13 (ref 9–20)
BUN: 14 mg/dL (ref 6–24)
Bilirubin Total: 0.4 mg/dL (ref 0.0–1.2)
CO2: 22 mmol/L (ref 20–29)
Calcium: 8.8 mg/dL (ref 8.7–10.2)
Chloride: 107 mmol/L — ABNORMAL HIGH (ref 96–106)
Creatinine, Ser: 1.1 mg/dL (ref 0.76–1.27)
Globulin, Total: 2.3 g/dL (ref 1.5–4.5)
Glucose: 114 mg/dL — ABNORMAL HIGH (ref 70–99)
Potassium: 4.1 mmol/L (ref 3.5–5.2)
Sodium: 144 mmol/L (ref 134–144)
Total Protein: 6.6 g/dL (ref 6.0–8.5)
eGFR: 79 mL/min/{1.73_m2} (ref >59)

## 2023-04-12 ENCOUNTER — Other Ambulatory Visit: Payer: Self-pay | Admitting: Dermatology

## 2023-04-12 DIAGNOSIS — Z79899 Other long term (current) drug therapy: Secondary | ICD-10-CM

## 2023-04-21 DIAGNOSIS — Z79899 Other long term (current) drug therapy: Secondary | ICD-10-CM | POA: Diagnosis not present

## 2023-04-22 ENCOUNTER — Ambulatory Visit: Payer: BC Managed Care – PPO | Admitting: Anesthesiology

## 2023-04-22 ENCOUNTER — Ambulatory Visit
Admission: RE | Admit: 2023-04-22 | Discharge: 2023-04-22 | Disposition: A | Discharge status: Home / Self Care | Payer: BC Managed Care – PPO | Attending: Gastroenterology | Admitting: Gastroenterology

## 2023-04-22 ENCOUNTER — Encounter: Payer: Self-pay | Admitting: Gastroenterology

## 2023-04-22 ENCOUNTER — Encounter
Admission: RE | Disposition: A | Discharge status: Home / Self Care | Payer: Self-pay | Source: Home / Self Care | Attending: Gastroenterology

## 2023-04-22 DIAGNOSIS — K900 Celiac disease: Secondary | ICD-10-CM | POA: Diagnosis not present

## 2023-04-22 DIAGNOSIS — K5730 Diverticulosis of large intestine without perforation or abscess without bleeding: Secondary | ICD-10-CM | POA: Diagnosis not present

## 2023-04-22 DIAGNOSIS — K219 Gastro-esophageal reflux disease without esophagitis: Secondary | ICD-10-CM | POA: Diagnosis not present

## 2023-04-22 DIAGNOSIS — R768 Other specified abnormal immunological findings in serum: Secondary | ICD-10-CM | POA: Insufficient documentation

## 2023-04-22 DIAGNOSIS — Z1211 Encounter for screening for malignant neoplasm of colon: Secondary | ICD-10-CM | POA: Insufficient documentation

## 2023-04-22 DIAGNOSIS — I10 Essential (primary) hypertension: Secondary | ICD-10-CM | POA: Diagnosis not present

## 2023-04-22 DIAGNOSIS — G4730 Sleep apnea, unspecified: Secondary | ICD-10-CM | POA: Insufficient documentation

## 2023-04-22 DIAGNOSIS — Z6838 Body mass index (BMI) 38.0-38.9, adult: Secondary | ICD-10-CM | POA: Diagnosis not present

## 2023-04-22 DIAGNOSIS — K2980 Duodenitis without bleeding: Secondary | ICD-10-CM | POA: Diagnosis not present

## 2023-04-22 DIAGNOSIS — K3189 Other diseases of stomach and duodenum: Secondary | ICD-10-CM | POA: Diagnosis not present

## 2023-04-22 DIAGNOSIS — E669 Obesity, unspecified: Secondary | ICD-10-CM | POA: Diagnosis not present

## 2023-04-22 DIAGNOSIS — K640 First degree hemorrhoids: Secondary | ICD-10-CM | POA: Diagnosis not present

## 2023-04-22 HISTORY — PX: COLONOSCOPY WITH PROPOFOL: SHX5780

## 2023-04-22 HISTORY — DX: Gastro-esophageal reflux disease without esophagitis: K21.9

## 2023-04-22 HISTORY — PX: ESOPHAGOGASTRODUODENOSCOPY (EGD) WITH PROPOFOL: SHX5813

## 2023-04-22 HISTORY — PX: BIOPSY: SHX5522

## 2023-04-22 LAB — CBC WITH DIFFERENTIAL/PLATELET
Basophils Absolute: 0 10*3/uL (ref 0.0–0.2)
Basos: 1 %
EOS (ABSOLUTE): 0.1 10*3/uL (ref 0.0–0.4)
Eos: 2 %
Hematocrit: 41.7 % (ref 37.5–51.0)
Hemoglobin: 13.9 g/dL (ref 13.0–17.7)
Immature Grans (Abs): 0 10*3/uL (ref 0.0–0.1)
Immature Granulocytes: 0 %
Lymphocytes Absolute: 1.7 10*3/uL (ref 0.7–3.1)
Lymphs: 28 %
MCH: 30.8 pg (ref 26.6–33.0)
MCHC: 33.3 g/dL (ref 31.5–35.7)
MCV: 93 fL (ref 79–97)
Monocytes Absolute: 0.6 10*3/uL (ref 0.1–0.9)
Monocytes: 9 %
Neutrophils Absolute: 3.5 10*3/uL (ref 1.4–7.0)
Neutrophils: 60 %
Platelets: 219 10*3/uL (ref 150–450)
RBC: 4.51 x10E6/uL (ref 4.14–5.80)
RDW: 11.4 % — ABNORMAL LOW (ref 11.6–15.4)
WBC: 5.9 10*3/uL (ref 3.4–10.8)

## 2023-04-22 LAB — COMPREHENSIVE METABOLIC PANEL
ALT: 24 [IU]/L (ref 0–44)
AST: 20 [IU]/L (ref 0–40)
Albumin: 4.5 g/dL (ref 3.8–4.9)
Alkaline Phosphatase: 61 [IU]/L (ref 44–121)
BUN/Creatinine Ratio: 23 — ABNORMAL HIGH (ref 9–20)
BUN: 22 mg/dL (ref 6–24)
Bilirubin Total: 0.5 mg/dL (ref 0.0–1.2)
CO2: 22 mmol/L (ref 20–29)
Calcium: 9 mg/dL (ref 8.7–10.2)
Chloride: 105 mmol/L (ref 96–106)
Creatinine, Ser: 0.94 mg/dL (ref 0.76–1.27)
Globulin, Total: 2.3 g/dL (ref 1.5–4.5)
Glucose: 81 mg/dL (ref 70–99)
Potassium: 4.2 mmol/L (ref 3.5–5.2)
Sodium: 144 mmol/L (ref 134–144)
Total Protein: 6.8 g/dL (ref 6.0–8.5)
eGFR: 95 mL/min/{1.73_m2} (ref >59)

## 2023-04-22 SURGERY — COLONOSCOPY WITH PROPOFOL
Anesthesia: General

## 2023-04-22 MED ORDER — DEXMEDETOMIDINE HCL IN NACL 80 MCG/20ML IV SOLN
INTRAVENOUS | Status: DC | PRN
  Administered 2023-04-22: 12 ug via INTRAVENOUS

## 2023-04-22 MED ORDER — PROPOFOL 10 MG/ML IV BOLUS
INTRAVENOUS | Status: DC | PRN
  Administered 2023-04-22: 100 mg via INTRAVENOUS

## 2023-04-22 MED ORDER — PROPOFOL 500 MG/50ML IV EMUL
INTRAVENOUS | Status: DC | PRN
  Administered 2023-04-22: 140 ug/kg/min via INTRAVENOUS

## 2023-04-22 MED ORDER — SODIUM CHLORIDE 0.9 % IV SOLN: INTRAVENOUS | Status: DC

## 2023-04-22 MED ORDER — LIDOCAINE HCL (CARDIAC) PF 100 MG/5ML IV SOSY
PREFILLED_SYRINGE | INTRAVENOUS | Status: DC | PRN
  Administered 2023-04-22: 100 mg via INTRAVENOUS

## 2023-04-22 NOTE — Transfer of Care (Signed)
Immediate Anesthesia Transfer of Care Note  Patient: Jesse Wade  Procedure(s) Performed: COLONOSCOPY WITH PROPOFOL ESOPHAGOGASTRODUODENOSCOPY (EGD) WITH PROPOFOL BIOPSY  Patient Location: PACU  Anesthesia Type:General  Level of Consciousness: awake, alert , and oriented  Airway & Oxygen Therapy: Patient Spontanous Breathing  Post-op Assessment: Report given to RN and Post -op Vital signs reviewed and stable  Post vital signs: Reviewed and stable  Last Vitals:  Vitals Value Taken Time  BP    Temp    Pulse 66 04/22/23 1018  Resp 15 04/22/23 1019  SpO2 95 % 04/22/23 1018  Vitals shown include unfiled device data.  Last Pain: There were no vitals filed for this visit.       Complications: No notable events documented.

## 2023-04-22 NOTE — Op Note (Signed)
[facility] Gastroenterology Patient Name: Jesse Wade Procedure Date: 04/22/2023 9:44 AM MRN: [id number] Account #: [id number] Date of Birth: 1966-03-22 Admit Type: Outpatient Age: 57 Room: [facility] ENDO ROOM 4 Gender: Male Note Status: Finalized Instrument Name: Laurette Schimke [id number] Procedure:             Upper GI endoscopy Indications:           Positive celiac serologies Providers:             Midge Minium MD, MD Referring MD:          Dorie Rank. Cannady (Referring MD) Medicines:             Propofol per Anesthesia Complications:         No immediate complications. Procedure:             Pre-Anesthesia Assessment:                        - Prior to the procedure, a History and Physical was                         performed, and patient medications and allergies were                         reviewed. The patient's tolerance of previous                         anesthesia was also reviewed. The risks and benefits                         of the procedure and the sedation options and risks                         were discussed with the patient. All questions were                         answered, and informed consent was obtained. Prior                         Anticoagulants: The patient has taken no anticoagulant                         or antiplatelet agents. ASA Grade Assessment: II - A                         patient with mild systemic disease. After reviewing                         the risks and benefits, the patient was deemed in                         satisfactory condition to undergo the procedure.                        After obtaining informed consent, the endoscope was                         passed under direct vision. Throughout the procedure,  the patient's blood pressure, pulse, and oxygen                         saturations were monitored continuously. The Endoscope                         was introduced through the mouth, and  advanced to the                         second part of duodenum. The upper GI endoscopy was                         accomplished without difficulty. The patient tolerated                         the procedure well. Findings:      The examined esophagus was normal.      The stomach was normal.      The examined duodenum was normal. Biopsies were taken with a cold       forceps for histology. Impression:            - Normal esophagus.                        - Normal stomach.                        - Normal examined duodenum. Biopsied. Recommendation:        - Discharge patient to home.                        - Resume previous diet.                        - Continue present medications.                        - Await pathology results.                        - Perform a colonoscopy today. Procedure Code(s):     --- Professional ---                        [phone], Esophagogastroduodenoscopy, flexible,                         transoral; with biopsy, single or multiple Diagnosis Code(s):     --- Professional ---                        R76.8, Other specified abnormal immunological findings                         in serum CPT copyright 2022 American Medical Association. All rights reserved. The codes documented in this report are preliminary and upon coder review may  be revised to meet current compliance requirements. Midge Minium MD, MD 04/22/2023 9:56:29 AM This report has been signed electronically. Number of Addenda: 0 Note Initiated On: 04/22/2023 9:44 AM Estimated Blood Loss:  Estimated blood loss: none.      [facility]

## 2023-04-22 NOTE — Op Note (Signed)
[facility] Gastroenterology Patient Name: Jesse Wade Procedure Date: 04/22/2023 9:43 AM MRN: [id number] Account #: [id number] Date of Birth: 03/05/1966 Admit Type: Outpatient Age: 57 Room: [facility] ENDO ROOM 4 Gender: Male Note Status: Finalized Instrument Name: Nelda Marseille [id number] Procedure:             Colonoscopy Indications:           Screening for colorectal malignant neoplasm Providers:             Midge Minium MD, MD Referring MD:          Dorie Rank. Cannady (Referring MD) Medicines:             Propofol per Anesthesia Complications:         No immediate complications. Procedure:             Pre-Anesthesia Assessment:                        - Prior to the procedure, a History and Physical was                         performed, and patient medications and allergies were                         reviewed. The patient's tolerance of previous                         anesthesia was also reviewed. The risks and benefits                         of the procedure and the sedation options and risks                         were discussed with the patient. All questions were                         answered, and informed consent was obtained. Prior                         Anticoagulants: The patient has taken no anticoagulant                         or antiplatelet agents. ASA Grade Assessment: II - A                         patient with mild systemic disease. After reviewing                         the risks and benefits, the patient was deemed in                         satisfactory condition to undergo the procedure.                        After obtaining informed consent, the colonoscope was                         passed under direct vision. Throughout the procedure,  the patient's blood pressure, pulse, and oxygen                         saturations were monitored continuously. The                         Colonoscope was introduced through the  anus and                         advanced to the the cecum, identified by appendiceal                         orifice and ileocecal valve. The colonoscopy was                         performed without difficulty. The patient tolerated                         the procedure well. The quality of the bowel                         preparation was excellent. Findings:      The perianal and digital rectal examinations were normal.      A few small-mouthed diverticula were found in the sigmoid colon.      Non-bleeding internal hemorrhoids were found during retroflexion. The       hemorrhoids were Grade I (internal hemorrhoids that do not prolapse). Impression:            - Diverticulosis in the sigmoid colon.                        - Non-bleeding internal hemorrhoids.                        - No specimens collected. Recommendation:        - Discharge patient to home.                        - Resume previous diet.                        - Continue present medications.                        - Repeat colonoscopy in 10 years for screening                         purposes. Procedure Code(s):     --- Professional ---                        [phone], Colonoscopy, flexible; diagnostic, including                         collection of specimen(s) by brushing or washing, when                         performed (separate procedure) Diagnosis Code(s):     --- Professional ---  Z12.11, Encounter for screening for malignant neoplasm                         of colon CPT copyright 2022 American [employer] Association. All rights reserved. The codes documented in this report are preliminary and upon coder review may  be revised to meet current compliance requirements. Midge Minium MD, MD 04/22/2023 10:15:09 AM This report has been signed electronically. Number of Addenda: 0 Note Initiated On: 04/22/2023 9:43 AM Scope Withdrawal Time: 0 hours 6 minutes 26 seconds  Total Procedure Duration: 0  hours 15 minutes 6 seconds  Estimated Blood Loss:  Estimated blood loss: none.      [facility]

## 2023-04-22 NOTE — H&P (Signed)
Jesse Minium, MD [facility] [address], [address] [place], [place] [address] Phone:[phone] Fax : [phone]  Primary Care Physician:  Marjie Skiff, NP Primary Gastroenterologist:  Dr. Servando Snare  Pre-Procedure History & Physical: HPI:  Jesse Wade is a 57 y.o. male is here for an endoscopy and colonoscopy.   Past Medical History:  Diagnosis Date   Allergic rhinitis    GERD (gastroesophageal reflux disease)    Headache    H/O MIGRAINES   Hypertension    Sleep apnea    BIPAP OR CPAP-PT UNSURE WHICH ONE    Past Surgical History:  Procedure Laterality Date   ANKLE SURGERY Right 2014, 2015   x3   INGUINAL HERNIA REPAIR Bilateral    childhood   TONSILLECTOMY     UMBILICAL HERNIA REPAIR N/A 02/25/2016   Procedure: HERNIA REPAIR UMBILICAL ADULT;  Surgeon: Kieth Brightly, MD;  Location: [facility] ORS;  Service: General;  Laterality: N/A;    Prior to Admission medications   Medication Sig Start Date End Date Taking? Authorizing Provider  benazepril (LOTENSIN) 40 MG tablet Take 1 tablet (40 mg total) by mouth daily. 03/30/22  Yes Aura Dials T, NP  dapsone 25 MG tablet Take one tab po BID. 04/05/23  Yes Elie Goody, MD  meloxicam (MOBIC) 15 MG tablet Take 1 tablet (15 mg total) by mouth daily. 10/16/22  Yes Cannady, Jolene T, NP  montelukast (SINGULAIR) 10 MG tablet Take 1 tablet (10 mg total) by mouth at bedtime. 04/29/16  Yes Particia Nearing, PA-C  omeprazole (PRILOSEC) 20 MG capsule TAKE 1 CAPSULE BY MOUTH EVERY DAY 04/05/23  Yes Cannady, Jolene T, NP  levocetirizine (XYZAL) 5 MG tablet Take 5 mg by mouth every evening.  05/25/15   [provider]  methocarbamol (ROBAXIN) 750 MG tablet Take 750 mg by mouth every 8 (eight) hours as needed. 09/28/22   [provider]  terbinafine (LAMISIL) 250 MG tablet Take 1 tablet once a day x 12 weeks then stop 03/01/23   Elie Goody, MD    Allergies as of 03/18/2023 - Review Complete 03/18/2023   Allergen Reaction Noted   Amlodipine Other (See Comments) 06/18/2015   Penicillins Rash 06/18/2015    Family History  Problem Relation Age of Onset   Heart disease Mother    Alcohol abuse Father    Heart disease Maternal Aunt    Heart disease Maternal Grandmother    COPD Maternal Grandmother    Diabetes Maternal Grandfather    Cancer Neg Hx    Hypertension Neg Hx    Stroke Neg Hx     Social History   Socioeconomic History   Marital status: Married    Spouse name: Not on file   Number of children: Not on file   Years of education: Not on file   Highest education level: Not on file  Occupational History   Not on file  Tobacco Use   Smoking status: Never   Smokeless tobacco: Never  Vaping Use   Vaping status: Never Used  Substance and Sexual Activity   Alcohol use: Yes    Comment: OCC   Drug use: No   Sexual activity: Not on file  Other Topics Concern   Not on file  Social History Narrative   Not on file   Social Determinants of Health   Financial Resource Strain: Not on file  Food Insecurity: Not on file  Transportation Needs: Not on file  Physical Activity: Not on file  Stress:  Not on file  Social Connections: Not on file  Intimate Partner Violence: Not on file    Review of Systems: See HPI, otherwise negative ROS  Physical Exam: BP (!) 159/87   Pulse 73   Temp (!) 97.1 F (36.2 C)   Resp 18   Ht 6\' 6"  (1.981 m)   Wt (!) 149.2 kg   SpO2 99%   BMI 38.02 kg/m  General:   Alert,  pleasant and cooperative in NAD Head:  Normocephalic and atraumatic. Neck:  Supple; no masses or thyromegaly. Lungs:  Clear throughout to auscultation.    Heart:  Regular rate and rhythm. Abdomen:  Soft, nontender and nondistended. Normal bowel sounds, without guarding, and without rebound.   Neurologic:  Alert and  oriented x4;  grossly normal neurologically.  Impression/Plan: [name] is here for an endoscopy and colonoscopy to be performed for screening and  celiac sprue  Risks, benefits, limitations, and alternatives regarding  endoscopy and colonoscopy have been reviewed with the patient.  Questions have been answered.  All parties agreeable.   Jesse Minium, MD  04/22/2023, 9:41 AM

## 2023-04-22 NOTE — Anesthesia Postprocedure Evaluation (Signed)
Anesthesia Post Note  Patient: Jesse Wade  Procedure(s) Performed: COLONOSCOPY WITH PROPOFOL ESOPHAGOGASTRODUODENOSCOPY (EGD) WITH PROPOFOL BIOPSY  Patient location during evaluation: PACU Anesthesia Type: General Level of consciousness: awake and awake and alert Pain management: satisfactory to patient Vital Signs Assessment: post-procedure vital signs reviewed and stable Respiratory status: spontaneous breathing Cardiovascular status: stable Anesthetic complications: no   No notable events documented.   Last Vitals:  Vitals:   04/22/23 0920 04/22/23 1018  BP: (!) 159/87 106/75  Pulse: 73 66  Resp: 18 15  Temp: (!) 36.2 C 36.4 C  SpO2: 99% 95%    Last Pain:  Vitals:   04/22/23 1028  PainSc: 0-No pain                 VAN STAVEREN,Odis Wickey

## 2023-04-22 NOTE — Anesthesia Preprocedure Evaluation (Addendum)
Anesthesia Evaluation  Patient identified by MRN, date of birth, ID band Patient awake    Reviewed: Allergy & Precautions, NPO status , Patient's Chart, lab work & pertinent test results  Airway Mallampati: II  TM Distance: >3 FB Neck ROM: Full    Dental  (+) Teeth Intact   Pulmonary neg pulmonary ROS, sleep apnea and Continuous Positive Airway Pressure Ventilation    Pulmonary exam normal breath sounds clear to auscultation       Cardiovascular Exercise Tolerance: Good hypertension, Pt. on medications Normal cardiovascular exam Rhythm:Regular Rate:Normal     Neuro/Psych  Headaches  Neuromuscular disease negative neurological ROS  negative psych ROS   GI/Hepatic negative GI ROS, Neg liver ROS,GERD  Medicated,,  Endo/Other  negative endocrine ROS    Renal/GU negative Renal ROS  negative genitourinary   Musculoskeletal   Abdominal  (+) + obese  Peds negative pediatric ROS (+)  Hematology negative hematology ROS (+)   Anesthesia Other Findings Past Medical History: No date: Allergic rhinitis No date: GERD (gastroesophageal reflux disease) No date: Headache     Comment:  H/O MIGRAINES No date: Hypertension No date: Sleep apnea     Comment:  BIPAP OR CPAP-PT UNSURE WHICH ONE  Past Surgical History: 2014, 2015: ANKLE SURGERY; Right     Comment:  x3 No date: INGUINAL HERNIA REPAIR; Bilateral     Comment:  childhood No date: TONSILLECTOMY 02/25/2016: UMBILICAL HERNIA REPAIR; N/A     Comment:  Procedure: HERNIA REPAIR UMBILICAL ADULT;  Surgeon:               Kieth Brightly, MD;  Location: [facility] ORS;  Service:              General;  Laterality: N/A;  BMI    Body Mass Index: 38.02 kg/m      Reproductive/Obstetrics negative OB ROS                             Anesthesia Physical Anesthesia Plan  ASA: 3  Anesthesia Plan: General   Post-op Pain Management:    Induction:  Intravenous  PONV Risk Score and Plan: Propofol infusion and TIVA  Airway Management Planned: Natural Airway and Nasal Cannula  Additional Equipment:   Intra-op Plan:   Post-operative Plan:   Informed Consent: I have reviewed the patients History and Physical, chart, labs and discussed the procedure including the risks, benefits and alternatives for the proposed anesthesia with the patient or authorized representative who has indicated his/her understanding and acceptance.     Dental Advisory Given  Plan Discussed with: CRNA and Surgeon  Anesthesia Plan Comments:         Anesthesia Quick Evaluation

## 2023-04-23 LAB — SURGICAL PATHOLOGY

## 2023-04-27 ENCOUNTER — Encounter: Payer: Self-pay | Admitting: Gastroenterology

## 2023-04-27 ENCOUNTER — Other Ambulatory Visit: Payer: Self-pay | Admitting: Nurse Practitioner

## 2023-04-27 DIAGNOSIS — I10 Essential (primary) hypertension: Secondary | ICD-10-CM

## 2023-04-28 NOTE — Telephone Encounter (Signed)
Requested medication (s) are due for refill today:yes  Requested medication (s) are on the active medication list:yes  Last refill:  03/30/22  Future visit scheduled: no  Notes to clinic:  pt's appt on 03/30/22 mentioned HTN none since-   Requested Prescriptions  Pending Prescriptions Disp Refills   benazepril (LOTENSIN) 40 MG tablet [Pharmacy Med Name: BENAZEPRIL HCL 40 MG TABLET] 90 tablet 4    Sig: TAKE 1 TABLET BY MOUTH EVERY DAY     Cardiovascular:  ACE Inhibitors Passed - 04/27/2023  1:33 AM      Passed - Cr in normal range and within 180 days    Creatinine, Ser  Date Value Ref Range Status  04/21/2023 0.94 0.76 - 1.27 mg/dL Final         Passed - K in normal range and within 180 days    Potassium  Date Value Ref Range Status  04/21/2023 4.2 3.5 - 5.2 mmol/L Final         Passed - Patient is not pregnant      Passed - Last BP in normal range    BP Readings from Last 1 Encounters:  04/22/23 121/84         Passed - Valid encounter within last 6 months    Recent Outpatient Visits           4 months ago Rash and nonspecific skin eruption   [place] [facility] Mecum, Oswaldo Conroy, PA-C   9 months ago Eustachian tube dysfunction, left   [place] [facility] [place], Crofton T, NP   1 year ago Morbid obesity (HCC)   [place] [facility] [place], Corrie Dandy T, NP   1 year ago Dysfunction of left eustachian tube   [place] [facility] McElwee, Lauren A, NP   1 year ago Primary hypertension   [place] [facility] Vigg, Avanti, MD       Future Appointments             In 5 days Elie Goody, MD [facility]

## 2023-04-29 NOTE — Telephone Encounter (Signed)
Called and scheduled pt on 05/14/2023 @ 1:20 pm.

## 2023-05-03 ENCOUNTER — Ambulatory Visit: Payer: BC Managed Care – PPO | Admitting: Dermatology

## 2023-05-03 ENCOUNTER — Encounter: Payer: Self-pay | Admitting: Dermatology

## 2023-05-03 DIAGNOSIS — L130 Dermatitis herpetiformis: Secondary | ICD-10-CM

## 2023-05-03 DIAGNOSIS — Z7189 Other specified counseling: Secondary | ICD-10-CM

## 2023-05-03 DIAGNOSIS — Z79899 Other long term (current) drug therapy: Secondary | ICD-10-CM

## 2023-05-03 NOTE — Progress Notes (Signed)
   Follow-Up Visit   Subjective  Jesse Wade is a 57 y.o. male who presents for the following: dermatitis herpetiformis follow up. Patient is currently taking Dapsone 250 mg BID. No side effects.   Patient did see GI and had biopsy done. Results showed + for celiac, class 3A and advised him to avoid gluten.  The patient has spots, moles and lesions to be evaluated, some may be new or changing and the patient may have concern these could be cancer.   The following portions of the chart were reviewed this encounter and updated as appropriate: medications, allergies, medical history  Review of Systems:  No other skin or systemic complaints except as noted in HPI or Assessment and Plan.  Objective  Well appearing patient in no apparent distress; mood and affect are within normal limits.   A focused examination was performed of the following areas: arms  Relevant exam findings are noted in the Assessment and Plan.  scalp, hands, arms, legs Clear on current regimen    Assessment & Plan     Dermatitis herpetiformis scalp, hands, arms, legs  Chronic condition, well-controlled on therapy   Patient is seeing GI, received colonoscopy and endoscopy with no visible disease but celiac supported on small intestine pathology 04/22/23  Continue Dapsone 25 mg twice a day.  Reviewed potential side effects. Tolerating with no side effects such as no dyspnea, cyanosis, grip weakness Repeat labs every 2 weeks. Get next draw in 2-3 days Continue avoiding gluten in diet  Patient will RTC in 3 months, consider decreasing dapsone  Related Procedures CBC with Differential/Platelets CMP  Related Medications dapsone 25 MG tablet Take one tab po BID.  Long-term use of high-risk medication  Counseling and coordination of care    Return in about 3 months (around 08/03/2023).  Anise Salvo, RMA, am acting as scribe for Elie Goody, MD .   Documentation: I have reviewed  the above documentation for accuracy and completeness, and I agree with the above.  Elie Goody, MD

## 2023-05-03 NOTE — Patient Instructions (Signed)

## 2023-05-09 NOTE — Patient Instructions (Signed)

## 2023-05-12 DIAGNOSIS — L130 Dermatitis herpetiformis: Secondary | ICD-10-CM | POA: Diagnosis not present

## 2023-05-13 LAB — COMPREHENSIVE METABOLIC PANEL
ALT: 21 [IU]/L (ref 0–44)
AST: 17 [IU]/L (ref 0–40)
Albumin: 4.5 g/dL (ref 3.8–4.9)
Alkaline Phosphatase: 67 [IU]/L (ref 44–121)
BUN/Creatinine Ratio: 23 — ABNORMAL HIGH (ref 9–20)
BUN: 22 mg/dL (ref 6–24)
Bilirubin Total: 0.4 mg/dL (ref 0.0–1.2)
CO2: 26 mmol/L (ref 20–29)
Calcium: 9.2 mg/dL (ref 8.7–10.2)
Chloride: 104 mmol/L (ref 96–106)
Creatinine, Ser: 0.97 mg/dL (ref 0.76–1.27)
Globulin, Total: 2.1 g/dL (ref 1.5–4.5)
Glucose: 96 mg/dL (ref 70–99)
Potassium: 4.1 mmol/L (ref 3.5–5.2)
Sodium: 143 mmol/L (ref 134–144)
Total Protein: 6.6 g/dL (ref 6.0–8.5)
eGFR: 92 mL/min/{1.73_m2} (ref >59)

## 2023-05-13 LAB — CBC WITH DIFFERENTIAL/PLATELET
Basophils Absolute: 0 10*3/uL (ref 0.0–0.2)
Basos: 0 %
EOS (ABSOLUTE): 0.3 10*3/uL (ref 0.0–0.4)
Eos: 5 %
Hematocrit: 40.7 % (ref 37.5–51.0)
Hemoglobin: 13.6 g/dL (ref 13.0–17.7)
Immature Grans (Abs): 0 10*3/uL (ref 0.0–0.1)
Immature Granulocytes: 0 %
Lymphocytes Absolute: 1.7 10*3/uL (ref 0.7–3.1)
Lymphs: 26 %
MCH: 31.1 pg (ref 26.6–33.0)
MCHC: 33.4 g/dL (ref 31.5–35.7)
MCV: 93 fL (ref 79–97)
Monocytes Absolute: 0.6 10*3/uL (ref 0.1–0.9)
Monocytes: 8 %
Neutrophils Absolute: 4.1 10*3/uL (ref 1.4–7.0)
Neutrophils: 61 %
Platelets: 232 10*3/uL (ref 150–450)
RBC: 4.38 x10E6/uL (ref 4.14–5.80)
RDW: 11.7 % (ref 11.6–15.4)
WBC: 6.8 10*3/uL (ref 3.4–10.8)

## 2023-05-14 ENCOUNTER — Encounter: Payer: Self-pay | Admitting: Nurse Practitioner

## 2023-05-14 ENCOUNTER — Ambulatory Visit: Payer: BC Managed Care – PPO | Admitting: Nurse Practitioner

## 2023-05-14 DIAGNOSIS — Z13220 Encounter for screening for lipoid disorders: Secondary | ICD-10-CM | POA: Diagnosis not present

## 2023-05-14 DIAGNOSIS — G4733 Obstructive sleep apnea (adult) (pediatric): Secondary | ICD-10-CM | POA: Diagnosis not present

## 2023-05-14 DIAGNOSIS — Z136 Encounter for screening for cardiovascular disorders: Secondary | ICD-10-CM

## 2023-05-14 DIAGNOSIS — I10 Essential (primary) hypertension: Secondary | ICD-10-CM | POA: Diagnosis not present

## 2023-05-14 DIAGNOSIS — K900 Celiac disease: Secondary | ICD-10-CM

## 2023-05-14 DIAGNOSIS — N400 Enlarged prostate without lower urinary tract symptoms: Secondary | ICD-10-CM | POA: Diagnosis not present

## 2023-05-14 DIAGNOSIS — K2100 Gastro-esophageal reflux disease with esophagitis, without bleeding: Secondary | ICD-10-CM

## 2023-05-14 MED ORDER — BENAZEPRIL HCL 40 MG PO TABS: 40.0000 mg | ORAL_TABLET | Freq: Every day | ORAL | 4 refills | Status: DC

## 2023-05-14 MED ORDER — OMEPRAZOLE 20 MG PO CPDR: 20.0000 mg | DELAYED_RELEASE_CAPSULE | Freq: Every day | ORAL | 4 refills | Status: DC

## 2023-05-14 NOTE — Assessment & Plan Note (Signed)
Chronic, ongoing.  At this time will continue Omeprazole, but recommend occasional trial at cutting back.  Risks of PPI use were discussed with patient including bone loss, C. Diff diarrhea, pneumonia, infections, CKD, electrolyte abnormalities.  Verbalizes understanding and chooses to continue the medication. Mag level today.

## 2023-05-14 NOTE — Progress Notes (Signed)
BP 136/72 (BP Location: Left Arm, Patient Position: Sitting, Cuff Size: Normal)   Pulse 73   Temp 98.2 F (36.8 C) (Oral)   Ht 6' 5.5" (1.969 m)   Wt (!) 337 lb 9.6 oz (153.1 kg)   SpO2 95%   BMI 39.52 kg/m    Subjective:    Patient ID: Jesse Wade, male    DOB: July 29, 1965, 57 y.o.   MRN: [id number]  HPI: Jesse Wade is a 57 y.o. male  Chief Complaint  Patient presents with   Follow-up    Ot sure of the visit, 12/2022 states Rash   HYPERTENSION  Taking Benazepril 40 MG daily.  Uses CPAP 100% of the time, diagnosed >5 years ago. Hypertension status: stable  Satisfied with current treatment? yes Duration of hypertension: chronic BP monitoring frequency:  rarely BP range: not often checking BP medication side effects:  no Medication compliance: good compliance Aspirin: no Recurrent headaches: no Visual changes: no Palpitations: no Dyspnea: no Chest pain: no Lower extremity edema: no Dizzy/lightheaded: no  The 10-year ASCVD risk score (Arnett DK, et al., 2019) is: 4.9%   Values used to calculate the score:     Age: 59 years     Sex: Male     Is Non-Hispanic African American: No     Diabetic: No     Tobacco smoker: No     Systolic Blood Pressure: 136 mmHg     Is BP treated: Yes     HDL Cholesterol: 57 mg/dL     Total Cholesterol: 143 mg/dL   GERD Continues on Omeprazole daily.  Recently diagnosed with Celiac during colonoscopy, 3a -- diagnosed 04/22/23.  He breaks out in a rash with gluten.  GERD control status: stable Satisfied with current treatment? yes Heartburn frequency: none Medication side effects: no  Medication compliance: stable Dysphagia: no Odynophagia:  no Hematemesis: no Blood in stool: no EGD: no   Relevant past medical, surgical, family and social history reviewed and updated as indicated. Interim medical history since our last visit reviewed. Allergies and medications reviewed and updated.  Review of Systems  Constitutional:   Negative for activity change, diaphoresis, fatigue and fever.  Respiratory:  Negative for cough, chest tightness, shortness of breath and wheezing.   Cardiovascular:  Negative for chest pain, palpitations and leg swelling.  Gastrointestinal: Negative.   Neurological: Negative.   Psychiatric/Behavioral: Negative.     Per HPI unless specifically indicated above     Objective:    BP 136/72 (BP Location: Left Arm, Patient Position: Sitting, Cuff Size: Normal)   Pulse 73   Temp 98.2 F (36.8 C) (Oral)   Ht 6' 5.5" (1.969 m)   Wt (!) 337 lb 9.6 oz (153.1 kg)   SpO2 95%   BMI 39.52 kg/m   Wt Readings from Last 3 Encounters:  05/14/23 (!) 337 lb 9.6 oz (153.1 kg)  04/22/23 (!) 329 lb (149.2 kg)  03/18/23 (!) 336 lb 6.4 oz (152.6 kg)    Physical Exam Vitals and nursing note reviewed.  Constitutional:      General: He is awake. He is not in acute distress.    Appearance: He is well-developed and well-groomed. He is obese. He is not ill-appearing or toxic-appearing.  HENT:     Head: Normocephalic and atraumatic.     Right Ear: Hearing normal. No drainage.     Left Ear: Hearing normal. No drainage.  Eyes:     General: Lids are normal.  Right eye: No discharge.        Left eye: No discharge.     Conjunctiva/sclera: Conjunctivae normal.     Pupils: Pupils are equal, round, and reactive to light.  Neck:     Thyroid: No thyromegaly.     Vascular: No carotid bruit.  Cardiovascular:     Rate and Rhythm: Normal rate and regular rhythm.     Heart sounds: Normal heart sounds, S1 normal and S2 normal. No murmur heard.    No gallop.  Pulmonary:     Effort: Pulmonary effort is normal. No accessory muscle usage or respiratory distress.     Breath sounds: Normal breath sounds.  Abdominal:     General: Bowel sounds are normal.     Palpations: Abdomen is soft.  Musculoskeletal:        General: Normal range of motion.     Cervical back: Normal range of motion and neck supple.      Right lower leg: No edema.     Left lower leg: No edema.  Lymphadenopathy:     Cervical: No cervical adenopathy.  Skin:    General: Skin is warm and dry.     Capillary Refill: Capillary refill takes less than 2 seconds.  Neurological:     Mental Status: He is alert and oriented to person, place, and time.     Deep Tendon Reflexes: Reflexes are normal and symmetric.  Psychiatric:        Attention and Perception: Attention normal.        Mood and Affect: Mood normal.        Speech: Speech normal.        Behavior: Behavior normal. Behavior is cooperative.        Thought Content: Thought content normal.    Results for orders placed or performed in visit on 05/03/23  CBC with Differential/Platelets  Result Value Ref Range   WBC 6.8 3.4 - 10.8 x10E3/uL   RBC 4.38 4.14 - 5.80 x10E6/uL   Hemoglobin 13.6 13.0 - 17.7 g/dL   Hematocrit 16.1 09.6 - 51.0 %   MCV 93 79 - 97 fL   MCH 31.1 26.6 - 33.0 pg   MCHC 33.4 31.5 - 35.7 g/dL   RDW 04.5 40.9 - 81.1 %   Platelets 232 150 - 450 x10E3/uL   Neutrophils 61 Not Estab. %   Lymphs 26 Not Estab. %   Monocytes 8 Not Estab. %   Eos 5 Not Estab. %   Basos 0 Not Estab. %   Neutrophils Absolute 4.1 1.4 - 7.0 x10E3/uL   Lymphocytes Absolute 1.7 0.7 - 3.1 x10E3/uL   Monocytes Absolute 0.6 0.1 - 0.9 x10E3/uL   EOS (ABSOLUTE) 0.3 0.0 - 0.4 x10E3/uL   Basophils Absolute 0.0 0.0 - 0.2 x10E3/uL   Immature Granulocytes 0 Not Estab. %   Immature Grans (Abs) 0.0 0.0 - 0.1 x10E3/uL  CMP  Result Value Ref Range   Glucose 96 70 - 99 mg/dL   BUN 22 6 - 24 mg/dL   Creatinine, Ser 9.14 0.76 - 1.27 mg/dL   eGFR 92 >78 GN/FAO/1.30   BUN/Creatinine Ratio 23 (H) 9 - 20   Sodium 143 134 - 144 mmol/L   Potassium 4.1 3.5 - 5.2 mmol/L   Chloride 104 96 - 106 mmol/L   CO2 26 20 - 29 mmol/L   Calcium 9.2 8.7 - 10.2 mg/dL   Total Protein 6.6 6.0 - 8.5 g/dL   Albumin 4.5 3.8 - 4.9  g/dL   Globulin, Total 2.1 1.5 - 4.5 g/dL   Bilirubin Total 0.4 0.0 - 1.2 mg/dL    Alkaline Phosphatase 67 44 - 121 IU/L   AST 17 0 - 40 IU/L   ALT 21 0 - 44 IU/L      Assessment & Plan:   Problem List Items Addressed This Visit       Cardiovascular and Mediastinum   Hypertension    Chronic, ongoing with BP at goal today on recheck.  Recommend he monitor BP at least a few mornings a week at home and document.  DASH diet at home.  Continue current medication regimen and adjust as needed.  Labs today: TSH, lipid.  Refills sent in.      Relevant Medications   benazepril (LOTENSIN) 40 MG tablet   Other Relevant Orders   TSH     Respiratory   OSA treated with BiPAP    Chronic, stable with 100% use of CPAP.  Continue this consistent use.        Digestive   Adult celiac disease    New diagnosis on 04/22/23.  He is working on gluten free diet and doing well.  Sees dermatology for rash that presented with gluten.  Continue collaboration with dermatology and GI, recent notes reviewed.      Reflux esophagitis    Chronic, ongoing.  At this time will continue Omeprazole, but recommend occasional trial at cutting back.  Risks of PPI use were discussed with patient including bone loss, C. Diff diarrhea, pneumonia, infections, CKD, electrolyte abnormalities.  Verbalizes understanding and chooses to continue the medication. Mag level today.      Relevant Medications   omeprazole (PRILOSEC) 20 MG capsule   Other Relevant Orders   Magnesium     Other   Morbid obesity (HCC) - Primary    BMI 39.52 with HTN and OSA.  Recommended eating smaller high protein, low fat meals more frequently and exercising 30 mins a day 5 times a week with a goal of 10-15lb weight loss in the next 3 months. Patient voiced their understanding and motivation to adhere to these recommendations.       Other Visit Diagnoses     Benign prostatic hyperplasia without lower urinary tract symptoms       PSA on labs today.   Relevant Orders   PSA   Encounter for lipid screening for cardiovascular  disease       Lipid panel today.   Relevant Orders   Lipid Panel w/o Chol/HDL Ratio        Follow up plan: Return in about 1 year (around 05/13/2024) for Annual Physical.

## 2023-05-14 NOTE — Assessment & Plan Note (Signed)
Chronic, stable with 100% use of CPAP.  Continue this consistent use.

## 2023-05-14 NOTE — Assessment & Plan Note (Signed)
New diagnosis on 04/22/23.  He is working on gluten free diet and doing well.  Sees dermatology for rash that presented with gluten.  Continue collaboration with dermatology and GI, recent notes reviewed.

## 2023-05-14 NOTE — Assessment & Plan Note (Signed)
Chronic, ongoing with BP at goal today on recheck.  Recommend he monitor BP at least a few mornings a week at home and document.  DASH diet at home.  Continue current medication regimen and adjust as needed.  Labs today: TSH, lipid.  Refills sent in.

## 2023-05-14 NOTE — Assessment & Plan Note (Signed)
BMI 39.52 with HTN and OSA.  Recommended eating smaller high protein, low fat meals more frequently and exercising 30 mins a day 5 times a week with a goal of 10-15lb weight loss in the next 3 months. Patient voiced their understanding and motivation to adhere to these recommendations.

## 2023-05-15 LAB — LIPID PANEL W/O CHOL/HDL RATIO
Cholesterol, Total: 148 mg/dL (ref 100–199)
HDL: 47 mg/dL (ref >39)
LDL Chol Calc (NIH): 85 mg/dL (ref 0–99)
Triglycerides: 86 mg/dL (ref 0–149)
VLDL Cholesterol Cal: 16 mg/dL (ref 5–40)

## 2023-05-15 LAB — TSH: TSH: 2.14 u[IU]/mL (ref 0.450–4.500)

## 2023-05-15 LAB — MAGNESIUM: Magnesium: 1.9 mg/dL (ref 1.6–2.3)

## 2023-05-15 LAB — PSA: Prostate Specific Ag, Serum: 0.4 ng/mL (ref 0.0–4.0)

## 2023-05-16 ENCOUNTER — Other Ambulatory Visit: Payer: Self-pay | Admitting: Dermatology

## 2023-05-16 DIAGNOSIS — Z79899 Other long term (current) drug therapy: Secondary | ICD-10-CM

## 2023-05-16 NOTE — Progress Notes (Signed)
Contacted via MyChart   Good morning Jesse Wade, all labs monitored by me are stable.  No medication changes needed at this time.  Continue all current medications.  Any questions? Keep being stellar!!  Thank you for allowing me to participate in your care.  I appreciate you. Kindest regards, Sibyl Mikula

## 2023-05-26 ENCOUNTER — Encounter: Payer: Self-pay | Admitting: Dermatology

## 2023-06-03 DIAGNOSIS — Z79899 Other long term (current) drug therapy: Secondary | ICD-10-CM | POA: Diagnosis not present

## 2023-06-04 LAB — CBC WITH DIFFERENTIAL/PLATELET
Basophils Absolute: 0.1 10*3/uL (ref 0.0–0.2)
Basos: 1 %
EOS (ABSOLUTE): 0.3 10*3/uL (ref 0.0–0.4)
Eos: 4 %
Hematocrit: 41.6 % (ref 37.5–51.0)
Hemoglobin: 13.9 g/dL (ref 13.0–17.7)
Immature Grans (Abs): 0 10*3/uL (ref 0.0–0.1)
Immature Granulocytes: 0 %
Lymphocytes Absolute: 1.9 10*3/uL (ref 0.7–3.1)
Lymphs: 31 %
MCH: 31.5 pg (ref 26.6–33.0)
MCHC: 33.4 g/dL (ref 31.5–35.7)
MCV: 94 fL (ref 79–97)
Monocytes Absolute: 0.6 10*3/uL (ref 0.1–0.9)
Monocytes: 9 %
Neutrophils Absolute: 3.3 10*3/uL (ref 1.4–7.0)
Neutrophils: 55 %
Platelets: 218 10*3/uL (ref 150–450)
RBC: 4.41 x10E6/uL (ref 4.14–5.80)
RDW: 11.5 % — ABNORMAL LOW (ref 11.6–15.4)
WBC: 6 10*3/uL (ref 3.4–10.8)

## 2023-06-04 LAB — COMPREHENSIVE METABOLIC PANEL
ALT: 21 [IU]/L (ref 0–44)
AST: 17 [IU]/L (ref 0–40)
Albumin: 4.5 g/dL (ref 3.8–4.9)
Alkaline Phosphatase: 67 [IU]/L (ref 44–121)
BUN/Creatinine Ratio: 17 (ref 9–20)
BUN: 20 mg/dL (ref 6–24)
Bilirubin Total: 0.4 mg/dL (ref 0.0–1.2)
CO2: 24 mmol/L (ref 20–29)
Calcium: 9.4 mg/dL (ref 8.7–10.2)
Chloride: 105 mmol/L (ref 96–106)
Creatinine, Ser: 1.2 mg/dL (ref 0.76–1.27)
Globulin, Total: 2.1 g/dL (ref 1.5–4.5)
Glucose: 85 mg/dL (ref 70–99)
Potassium: 4.9 mmol/L (ref 3.5–5.2)
Sodium: 143 mmol/L (ref 134–144)
Total Protein: 6.6 g/dL (ref 6.0–8.5)
eGFR: 71 mL/min/{1.73_m2} (ref >59)

## 2023-06-07 ENCOUNTER — Encounter: Payer: Self-pay | Admitting: Dermatology

## 2023-07-06 ENCOUNTER — Other Ambulatory Visit: Payer: Self-pay | Admitting: Nurse Practitioner

## 2023-07-06 ENCOUNTER — Other Ambulatory Visit: Payer: Self-pay | Admitting: Dermatology

## 2023-07-06 DIAGNOSIS — L130 Dermatitis herpetiformis: Secondary | ICD-10-CM

## 2023-07-06 DIAGNOSIS — K2100 Gastro-esophageal reflux disease with esophagitis, without bleeding: Secondary | ICD-10-CM

## 2023-07-06 DIAGNOSIS — Z79899 Other long term (current) drug therapy: Secondary | ICD-10-CM

## 2023-07-06 MED ORDER — DAPSONE 25 MG PO TABS: 50.0000 mg | ORAL_TABLET | Freq: Every day | ORAL | 0 refills | Status: DC

## 2023-07-07 NOTE — Telephone Encounter (Signed)
 Requested by interface surescripts. Future visit in 10 months. Requested Prescriptions  Pending Prescriptions Disp Refills   omeprazole  (PRILOSEC) 20 MG capsule [Pharmacy Med Name: OMEPRAZOLE  DR 20 MG CAPSULE] 90 capsule 4    Sig: TAKE 1 CAPSULE BY MOUTH EVERY DAY     Gastroenterology: Proton Pump Inhibitors Passed - 07/07/2023  8:26 AM      Passed - Valid encounter within last 12 months    Recent Outpatient Visits           1 month ago Morbid obesity (HCC)   [place] [facility] [place], [place] T, NP   6 months ago Rash and nonspecific skin eruption   [place] [facility] Mecum, Pearla Bottom, PA-C   11 months ago Eustachian tube dysfunction, left   [place] [facility] [place], [place] T, NP   1 year ago Morbid obesity (HCC)   [place] [facility] [place], Sanjuan Crumbly T, NP   2 years ago Dysfunction of left eustachian tube    [facility] Odette Benjamin, NP       Future Appointments             In 1 month Harris Liming, MD [facility]   In 10 months Kimberly, Lavelle Posey, NP  [facility], [facility]

## 2023-07-27 ENCOUNTER — Other Ambulatory Visit: Payer: Self-pay | Admitting: Nurse Practitioner

## 2023-07-27 DIAGNOSIS — I10 Essential (primary) hypertension: Secondary | ICD-10-CM

## 2023-07-28 NOTE — Telephone Encounter (Signed)
 Requested Prescriptions  Pending Prescriptions Disp Refills   benazepril  (LOTENSIN ) 40 MG tablet [Pharmacy Med Name: BENAZEPRIL  HCL 40 MG TABLET] 90 tablet 1    Sig: Take 1 tablet (40 mg total) by mouth daily.     Cardiovascular:  ACE Inhibitors Passed - 07/28/2023 11:32 AM      Passed - Cr in normal range and within 180 days    Creatinine, Ser  Date Value Ref Range Status  06/03/2023 1.20 0.76 - 1.27 mg/dL Final         Passed - K in normal range and within 180 days    Potassium  Date Value Ref Range Status  06/03/2023 4.9 3.5 - 5.2 mmol/L Final         Passed - Patient is not pregnant      Passed - Last BP in normal range    BP Readings from Last 1 Encounters:  05/14/23 136/72         Passed - Valid encounter within last 6 months    Recent Outpatient Visits           2 months ago Morbid obesity (HCC)   [place] [facility] [place], [place] T, NP   7 months ago Rash and nonspecific skin eruption   [place] [facility] Mecum, Rocky BRAVO, PA-C   1 year ago Eustachian tube dysfunction, left   [place] [facility] [place], Catarina T, NP   1 year ago Morbid obesity (HCC)   [place] [facility] [place], Melanie T, NP   2 years ago Dysfunction of left eustachian tube   [place] [facility] Nedra Tinnie LABOR, NP       Future Appointments             In 3 weeks Claudene Lehmann, MD [facility]   In 9 months [place], Melanie DASEN, NP [facility], [facility]

## 2023-08-09 ENCOUNTER — Other Ambulatory Visit: Payer: Self-pay | Admitting: Nurse Practitioner

## 2023-08-09 DIAGNOSIS — K2100 Gastro-esophageal reflux disease with esophagitis, without bleeding: Secondary | ICD-10-CM

## 2023-08-10 NOTE — Telephone Encounter (Signed)
Refilled 07/07/23 # 90 with 4 refills. Requested Prescriptions  Refused Prescriptions Disp Refills   omeprazole (PRILOSEC) 20 MG capsule [Pharmacy Med Name: OMEPRAZOLE DR 20 MG CAPSULE] 90 capsule 4    Sig: TAKE 1 CAPSULE BY MOUTH EVERY DAY     Gastroenterology: Proton Pump Inhibitors Passed - 08/10/2023  1:40 PM      Passed - Valid encounter within last 12 months    Recent Outpatient Visits           2 months ago Morbid obesity (HCC)   [place] [facility] [place], [place] T, NP   7 months ago Rash and nonspecific skin eruption   [place] [facility] Mecum, Oswaldo Conroy, PA-C   1 year ago Eustachian tube dysfunction, left   [place] [facility] [place], Corrie Dandy T, NP   1 year ago Morbid obesity (HCC)   [place] [facility] Henryetta [place], Corrie Dandy T, NP   2 years ago Dysfunction of left eustachian tube   [facility] Gerre Scull, NP       Future Appointments             In 1 week Elie Goody, MD [facility]   In 9 months [place], Dorie Rank, NP [facility], [facility]

## 2023-08-11 ENCOUNTER — Ambulatory Visit: Payer: BC Managed Care – PPO | Admitting: Dermatology

## 2023-08-16 ENCOUNTER — Other Ambulatory Visit: Payer: Self-pay | Admitting: Dermatology

## 2023-08-16 DIAGNOSIS — L130 Dermatitis herpetiformis: Secondary | ICD-10-CM

## 2023-08-16 DIAGNOSIS — Z79899 Other long term (current) drug therapy: Secondary | ICD-10-CM

## 2023-08-16 MED ORDER — DAPSONE 25 MG PO TABS: 50.0000 mg | ORAL_TABLET | Freq: Every day | ORAL | 0 refills | Status: DC

## 2023-08-18 ENCOUNTER — Encounter: Payer: Self-pay | Admitting: Dermatology

## 2023-08-18 ENCOUNTER — Telehealth: Payer: Self-pay | Admitting: Gastroenterology

## 2023-08-18 ENCOUNTER — Ambulatory Visit: Payer: BC Managed Care – PPO | Admitting: Dermatology

## 2023-08-18 DIAGNOSIS — L130 Dermatitis herpetiformis: Secondary | ICD-10-CM

## 2023-08-18 DIAGNOSIS — Z79899 Other long term (current) drug therapy: Secondary | ICD-10-CM | POA: Diagnosis not present

## 2023-08-18 DIAGNOSIS — Z5181 Encounter for therapeutic drug level monitoring: Secondary | ICD-10-CM

## 2023-08-18 MED ORDER — DAPSONE 25 MG PO TABS: 50.0000 mg | ORAL_TABLET | Freq: Every day | ORAL | 3 refills | Status: AC

## 2023-08-18 NOTE — Patient Instructions (Addendum)

## 2023-08-18 NOTE — Progress Notes (Signed)
   Follow-Up Visit   Subjective  Jesse Wade is a 58 y.o. male who presents for the following: dermatitis herpetiformis follow up. Patient is currently taking Dapsone 50mg , pt states he went 1 week without medication due to running out and broke out again but was well controlled while he was taking medication consistently. No side effects and has avoided gluten in his diet.   Pt accompanied by spouse.  The patient has spots, moles and lesions to be evaluated, some may be new or changing and the patient may have concern these could be cancer.   The following portions of the chart were reviewed this encounter and updated as appropriate: medications, allergies, medical history  Review of Systems:  No other skin or systemic complaints except as noted in HPI or Assessment and Plan.  Objective  Well appearing patient in no apparent distress; mood and affect are within normal limits.   A focused examination was performed of the following areas: Arms  Relevant exam findings are noted in the Assessment and Plan.    Assessment & Plan   Dermatitis herpetiformis, flaring after 1 week holiday from dapsone, chronic and not at goal  Exam: crusted pink papules on elbows chin scalp   Patient is seeing GI, received colonoscopy and endoscopy with no visible disease but celiac supported on small intestine pathology 04/22/23   Treatment plan: Continue Dapsone 50 mg daily Reviewed potential side effects. Tolerating with no side effects such as no dyspnea, cyanosis, grip weakness. -Repeat labs today and every 3 months. -Continue avoiding gluten in diet -gave info for patient to call GI for follow up  DERMATITIS HERPETIFORMIS   Related Procedures CMP CBC with Differential/Platelets Related Medications dapsone 25 MG tablet Take 2 tablets (50 mg total) by mouth daily. HIGH RISK MEDICATION USE   Related Medications dapsone 25 MG tablet Take 2 tablets (50 mg total) by mouth  daily.  Return in about 6 months (around 02/15/2024) for w/ Dr. Katrinka Blazing.  Wynonia Lawman, CMA, am acting as scribe for Elie Goody, MD .  Documentation: I have reviewed the above documentation for accuracy and completeness, and I agree with the above.  Elie Goody, MD

## 2023-08-18 NOTE — Telephone Encounter (Signed)
 The patient called in to schedule a follow up.

## 2023-08-19 ENCOUNTER — Encounter: Payer: Self-pay | Admitting: Dermatology

## 2023-08-19 LAB — COMPREHENSIVE METABOLIC PANEL
ALT: 22 [IU]/L (ref 0–44)
AST: 19 [IU]/L (ref 0–40)
Albumin: 4.5 g/dL (ref 3.8–4.9)
Alkaline Phosphatase: 71 [IU]/L (ref 44–121)
BUN/Creatinine Ratio: 22 — ABNORMAL HIGH (ref 9–20)
BUN: 23 mg/dL (ref 6–24)
Bilirubin Total: 0.3 mg/dL (ref 0.0–1.2)
CO2: 23 mmol/L (ref 20–29)
Calcium: 9.4 mg/dL (ref 8.7–10.2)
Chloride: 104 mmol/L (ref 96–106)
Creatinine, Ser: 1.04 mg/dL (ref 0.76–1.27)
Globulin, Total: 2.2 g/dL (ref 1.5–4.5)
Glucose: 95 mg/dL (ref 70–99)
Potassium: 4.4 mmol/L (ref 3.5–5.2)
Sodium: 144 mmol/L (ref 134–144)
Total Protein: 6.7 g/dL (ref 6.0–8.5)
eGFR: 84 mL/min/{1.73_m2} (ref >59)

## 2023-08-19 LAB — CBC WITH DIFFERENTIAL/PLATELET
Basophils Absolute: 0.1 10*3/uL (ref 0.0–0.2)
Basos: 1 %
EOS (ABSOLUTE): 0.3 10*3/uL (ref 0.0–0.4)
Eos: 5 %
Hematocrit: 41.5 % (ref 37.5–51.0)
Hemoglobin: 14 g/dL (ref 13.0–17.7)
Immature Grans (Abs): 0 10*3/uL (ref 0.0–0.1)
Immature Granulocytes: 0 %
Lymphocytes Absolute: 1.9 10*3/uL (ref 0.7–3.1)
Lymphs: 30 %
MCH: 31.5 pg (ref 26.6–33.0)
MCHC: 33.7 g/dL (ref 31.5–35.7)
MCV: 93 fL (ref 79–97)
Monocytes Absolute: 0.5 10*3/uL (ref 0.1–0.9)
Monocytes: 8 %
Neutrophils Absolute: 3.5 10*3/uL (ref 1.4–7.0)
Neutrophils: 56 %
Platelets: 237 10*3/uL (ref 150–450)
RBC: 4.45 x10E6/uL (ref 4.14–5.80)
RDW: 11.4 % — ABNORMAL LOW (ref 11.6–15.4)
WBC: 6.2 10*3/uL (ref 3.4–10.8)

## 2023-08-30 ENCOUNTER — Ambulatory Visit: Payer: BC Managed Care – PPO | Admitting: Physician Assistant

## 2023-08-30 ENCOUNTER — Encounter: Payer: Self-pay | Admitting: Physician Assistant

## 2023-08-30 VITALS — BP 135/78 | HR 70 | Temp 97.6°F | Ht 78.0 in | Wt 342.2 lb

## 2023-08-30 DIAGNOSIS — K900 Celiac disease: Secondary | ICD-10-CM | POA: Diagnosis not present

## 2023-08-30 DIAGNOSIS — E559 Vitamin D deficiency, unspecified: Secondary | ICD-10-CM

## 2023-08-30 NOTE — Progress Notes (Signed)
 Celso Amy, PA-C [address]  [address]  [place], [place] [address]  Main: [phone]  Fax: [phone]   Primary Care Physician: Marjie Skiff, NP  Primary Gastroenterologist:  Celso Amy, PA-C / Dr. Midge Minium    CC: F/U Celiac Disease and dermatitis herpetiformis  HPI: Jesse Wade is a 58 y.o. male returns for 71-month follow-up of celiac disease and dermatitis herpetiformis.  He has been treated with steroids through dermatology and his rash has improved.  He started a gluten-free diet 03/04/2023.  He is currently feeling better.  He has not had any GI symptoms.  He has not seen a nutritionist.  He is here today with his wife who helps cook.  He has no GI concerns today.  Labs 02/2023 showed normal hemoglobin 13.6. Normal Iron, B12 and Folate.  Low Vitamin D (started on vitamin D supplement).  Elevated antibody Aiden IgA of 55, positive gliadin IgG 26, mildly elevated TTG IgA 4.  Celiac panel serology consistent with celiac disease.  04/22/2023 EGD by Dr. Servando Snare: Duodenal biopsies showed duodenal mucosa with intraepithelial lymphocytes and villous shortening compatible with celiac disease.  Negative for dysplasia.  Normal esophagus and stomach.  04/22/2023 colonoscopy: No polyps.  Mild sigmoid diverticulosis.  Grade 1 internal hemorrhoids.  Excellent prep.  10-year repeat.  Current Outpatient Medications  Medication Sig Dispense Refill   benazepril (LOTENSIN) 40 MG tablet Take 1 tablet (40 mg total) by mouth daily. 90 tablet 1   dapsone 25 MG tablet Take 2 tablets (50 mg total) by mouth daily. 180 tablet 3   levocetirizine (XYZAL) 5 MG tablet Take 5 mg by mouth every evening.   6   meloxicam (MOBIC) 15 MG tablet Take 1 tablet (15 mg total) by mouth daily. 90 tablet 3   methocarbamol (ROBAXIN) 750 MG tablet Take 750 mg by mouth every 8 (eight) hours as needed.     montelukast (SINGULAIR) 10 MG tablet Take 1 tablet (10 mg total) by mouth at bedtime. 90 tablet 3    omeprazole (PRILOSEC) 20 MG capsule TAKE 1 CAPSULE BY MOUTH EVERY DAY 90 capsule 4   terbinafine (LAMISIL) 250 MG tablet Take 1 tablet once a day x 12 weeks then stop 90 tablet 0   No current facility-administered medications for this visit.    Allergies as of 08/30/2023 - Review Complete 08/30/2023  Allergen Reaction Noted   Amlodipine Other (See Comments) 06/18/2015   Penicillins Rash and Other (See Comments) 06/18/2015    Past Medical History:  Diagnosis Date   Allergic rhinitis    GERD (gastroesophageal reflux disease)    Headache    H/O MIGRAINES   Hypertension    Sleep apnea    BIPAP OR CPAP-PT UNSURE WHICH ONE    Past Surgical History:  Procedure Laterality Date   ANKLE SURGERY Right 2014, 2015   x3   BIOPSY  04/22/2023   Procedure: BIOPSY;  Surgeon: Midge Minium, MD;  Location: [facility] ENDOSCOPY;  Service: Endoscopy;;   COLONOSCOPY WITH PROPOFOL N/A 04/22/2023   Procedure: COLONOSCOPY WITH PROPOFOL;  Surgeon: Midge Minium, MD;  Location: [facility] ENDOSCOPY;  Service: Endoscopy;  Laterality: N/A;   ESOPHAGOGASTRODUODENOSCOPY (EGD) WITH PROPOFOL N/A 04/22/2023   Procedure: ESOPHAGOGASTRODUODENOSCOPY (EGD) WITH PROPOFOL;  Surgeon: Midge Minium, MD;  Location: [facility] ENDOSCOPY;  Service: Endoscopy;  Laterality: N/A;   INGUINAL HERNIA REPAIR Bilateral    childhood   TONSILLECTOMY     UMBILICAL HERNIA REPAIR N/A 02/25/2016   Procedure: HERNIA REPAIR  UMBILICAL ADULT;  Surgeon: Kieth Brightly, MD;  Location: [facility] ORS;  Service: General;  Laterality: N/A;    Review of Systems:    All systems reviewed and negative except where noted in HPI.   Physical Examination:   BP 135/78   Pulse 70   Temp 97.6 F (36.4 C)   Ht 6\' 6"  (1.981 m)   Wt (!) 342 lb 3.2 oz (155.2 kg)   BMI 39.55 kg/m   General: Well-nourished, well-developed in no acute distress.  Neuro: Alert and oriented x 3.  Grossly intact.  Psych: Alert and cooperative, normal mood and affect. Skin: No  Rashes.   Imaging Studies: No results found.  Assessment and Plan:   Jesse Wade is a 58 y.o. y/o male returns for 82-month follow-up of celiac disease which initially presented as dermatitis herpetiformis.  Has been on gluten-free diet since 02/2023 and his rash has improved..  Labs and EGD were positive for celiac disease.  He is doing pretty well on gluten-free diet.  He has had some dietary indiscretions.  Setting up nutrition referral for further support.  1.  Celiac disease  Lab: Celiac Panal.  Continue strict gluten-free diet.  Nutritionist referral.  Patient education handout given from [employer].  Labs 02/2023 showed Normal B12, Iron and Folate.  Labs 07/2023 showed Noraml CBC & CMP.  2.  Vitamin D Deficiency  Lab: Vitmin D today.  3.  Dermatitis Herpetiformis - Improved / Resolved  F/U with Dermatologist  Celso Amy, PA-C  Follow up 1 year.

## 2023-08-31 ENCOUNTER — Encounter: Payer: Self-pay | Admitting: Physician Assistant

## 2023-08-31 LAB — CELIAC AB TTG DGP TIGA
Antigliadin Abs, IgA: 29 U — ABNORMAL HIGH (ref 0–19)
Gliadin IgG: 10 U (ref 0–19)
IgA/Immunoglobulin A, Serum: 127 mg/dL (ref 90–386)
Tissue Transglut Ab: <2 U/mL (ref 0–5)
Transglutaminase IgA: 3 U/mL (ref 0–3)

## 2023-08-31 LAB — VITAMIN D 25 HYDROXY (VIT D DEFICIENCY, FRACTURES): Vit D, 25-Hydroxy: 32.3 ng/mL (ref 30.0–100.0)

## 2023-10-08 ENCOUNTER — Ambulatory Visit: Admitting: Nurse Practitioner

## 2023-10-11 ENCOUNTER — Other Ambulatory Visit: Payer: Self-pay | Admitting: Nurse Practitioner

## 2023-10-11 DIAGNOSIS — K2100 Gastro-esophageal reflux disease with esophagitis, without bleeding: Secondary | ICD-10-CM

## 2023-10-12 ENCOUNTER — Encounter: Payer: Self-pay | Admitting: Nurse Practitioner

## 2023-10-12 DIAGNOSIS — I10 Essential (primary) hypertension: Secondary | ICD-10-CM

## 2023-10-12 NOTE — Telephone Encounter (Signed)
 Too soon for refill, last refill 07/07/23 for 90 and 4 refills.  Requested Prescriptions  Pending Prescriptions Disp Refills   omeprazole  (PRILOSEC) 20 MG capsule [Pharmacy Med Name: OMEPRAZOLE  DR 20 MG CAPSULE] 90 capsule 4    Sig: TAKE 1 CAPSULE BY MOUTH EVERY DAY     Gastroenterology: Proton Pump Inhibitors Failed - 10/12/2023  3:50 PM      Failed - Valid encounter within last 12 months    Recent Outpatient Visits   None     Future Appointments             In 4 months Harris Liming, MD [facility]   In 7 months [place], Lavelle Posey, NP [place] [facility], [facility]

## 2023-11-01 ENCOUNTER — Other Ambulatory Visit: Payer: Self-pay | Admitting: Nurse Practitioner

## 2023-11-01 DIAGNOSIS — Z981 Arthrodesis status: Secondary | ICD-10-CM

## 2023-11-02 ENCOUNTER — Other Ambulatory Visit: Payer: Self-pay

## 2023-11-02 DIAGNOSIS — Z981 Arthrodesis status: Secondary | ICD-10-CM

## 2023-11-02 MED ORDER — MELOXICAM 15 MG PO TABS
15.0000 mg | ORAL_TABLET | Freq: Every day | ORAL | 3 refills | Status: AC
Start: 2023-11-02 — End: ?

## 2023-11-02 NOTE — Telephone Encounter (Signed)
 Copied from CRM [id number]. Topic: Clinical - Prescription Issue >> Nov 02, 2023  3:13 PM [place] wrote: Reason for CRM: meloxicam  (MOBIC ) 15 MG tablet- wants prescription filled at [facility]-

## 2023-11-08 ENCOUNTER — Other Ambulatory Visit: Payer: Self-pay | Admitting: Dermatology

## 2023-11-08 ENCOUNTER — Encounter: Payer: Self-pay | Admitting: Dermatology

## 2023-11-08 DIAGNOSIS — Z79899 Other long term (current) drug therapy: Secondary | ICD-10-CM

## 2023-11-08 MED ORDER — BENAZEPRIL HCL 40 MG PO TABS: 40.0000 mg | ORAL_TABLET | Freq: Every day | ORAL | 1 refills | Status: DC

## 2023-11-11 ENCOUNTER — Ambulatory Visit
Admission: RE | Admit: 2023-11-11 | Discharge: 2023-11-11 | Disposition: A | Discharge status: Home / Self Care | Source: Ambulatory Visit | Attending: Nurse Practitioner | Admitting: Nurse Practitioner

## 2023-11-11 ENCOUNTER — Encounter: Payer: Self-pay | Admitting: Nurse Practitioner

## 2023-11-11 ENCOUNTER — Ambulatory Visit

## 2023-11-11 VITALS — BP 129/77 | HR 60 | Temp 98.4°F | Resp 17 | Ht 77.99 in | Wt 336.8 lb

## 2023-11-11 DIAGNOSIS — M47816 Spondylosis without myelopathy or radiculopathy, lumbar region: Secondary | ICD-10-CM | POA: Diagnosis not present

## 2023-11-11 DIAGNOSIS — M545 Low back pain, unspecified: Secondary | ICD-10-CM | POA: Insufficient documentation

## 2023-11-11 DIAGNOSIS — Z79899 Other long term (current) drug therapy: Secondary | ICD-10-CM | POA: Diagnosis not present

## 2023-11-11 MED ORDER — METHOCARBAMOL 750 MG PO TABS: 750.0000 mg | ORAL_TABLET | Freq: Three times a day (TID) | ORAL | 0 refills | Status: AC | PRN

## 2023-11-11 MED ORDER — METHYLPREDNISOLONE 4 MG PO TBPK: ORAL_TABLET | ORAL | 0 refills | Status: DC

## 2023-11-11 NOTE — Progress Notes (Signed)
 BP 129/77 (BP Location: Right Arm, Patient Position: Sitting, Cuff Size: Large)   Pulse 60   Temp 98.4 F (36.9 C) (Oral)   Resp 17   Ht 6' 5.99" (1.981 m)   Wt (!) 336 lb 12.8 oz (152.8 kg)   SpO2 99%   BMI 38.93 kg/m    Subjective:    Patient ID: Jesse Wade, male    DOB: Oct 30, 1965, 58 y.o.   MRN: [id number]  HPI: Jesse Wade is a 58 y.o. male  Chief Complaint  Patient presents with   Back Pain    Was trying to do garden work and woke up Monday in extreme back pain. In his back and down the front of his right leg. Leg extension are ok but when sit to stand he feels the right leg is weak.    BACK PAIN Patient was out in the yard on Sunday and woke up on Monday with significant back pain.  On Monday he could barely bend down and pick something up off the ground. Duration: days Mechanism of injury: gardening Location: R>L and low back Onset: sudden Severity: 8/10 Quality: aching Frequency: constant especially with walking.  If he bends over and walks then it doesn't hurt as bad. Radiation: Radiating down the front of his leg Aggravating factors: movement Alleviating factors: NSAIDs Status: better Treatments attempted: naproxen and meloxicam  and aleve  Relief with NSAIDs?: significant Nighttime pain:  3-4/10 Paresthesias / decreased sensation:  no Bowel / bladder incontinence:  no Fevers:  no Dysuria / urinary frequency:  no  Relevant past medical, surgical, family and social history reviewed and updated as indicated. Interim medical history since our last visit reviewed. Allergies and medications reviewed and updated.  Review of Systems  Musculoskeletal:  Positive for back pain.    Per HPI unless specifically indicated above     Objective:     BP 129/77 (BP Location: Right Arm, Patient Position: Sitting, Cuff Size: Large)   Pulse 60   Temp 98.4 F (36.9 C) (Oral)   Resp 17   Ht 6' 5.99" (1.981 m)   Wt (!) 336 lb 12.8 oz (152.8 kg)   SpO2 99%   BMI  38.93 kg/m   Wt Readings from Last 3 Encounters:  11/11/23 (!) 336 lb 12.8 oz (152.8 kg)  08/30/23 (!) 342 lb 3.2 oz (155.2 kg)  05/14/23 (!) 337 lb 9.6 oz (153.1 kg)    Physical Exam Vitals and nursing note reviewed.  Constitutional:      General: He is not in acute distress.    Appearance: Normal appearance. He is not ill-appearing, toxic-appearing or diaphoretic.  HENT:     Head: Normocephalic.     Right Ear: External ear normal.     Left Ear: External ear normal.     Nose: Nose normal. No congestion or rhinorrhea.     Mouth/Throat:     Mouth: Mucous membranes are moist.  Eyes:     General:        Right eye: No discharge.        Left eye: No discharge.     Extraocular Movements: Extraocular movements intact.     Conjunctiva/sclera: Conjunctivae normal.     Pupils: Pupils are equal, round, and reactive to light.  Cardiovascular:     Rate and Rhythm: Normal rate and regular rhythm.     Heart sounds: No murmur heard. Pulmonary:     Effort: Pulmonary effort is normal. No respiratory distress.  Breath sounds: Normal breath sounds. No wheezing, rhonchi or rales.  Abdominal:     General: Abdomen is flat. Bowel sounds are normal.  Musculoskeletal:     Cervical back: Normal range of motion and neck supple.     Lumbar back: No swelling, edema, deformity, signs of trauma, lacerations, spasms, tenderness or bony tenderness. Decreased range of motion. Positive right straight leg raise test. Negative left straight leg raise test. No scoliosis.  Skin:    General: Skin is warm and dry.     Capillary Refill: Capillary refill takes less than 2 seconds.  Neurological:     General: No focal deficit present.     Mental Status: He is alert and oriented to person, place, and time.  Psychiatric:        Mood and Affect: Mood normal.        Behavior: Behavior normal.        Thought Content: Thought content normal.        Judgment: Judgment normal.     Results for orders placed or  performed in visit on 08/30/23  Celiac Ab tTG DGP TIgA   Collection Time: 08/30/23  2:32 PM  Result Value Ref Range   IgA/Immunoglobulin A, Serum 127 90 - 386 mg/dL   Antigliadin Abs, IgA 29 (H) 0 - 19 units   Gliadin IgG 10 0 - 19 units   Transglutaminase IgA 3 0 - 3 U/mL   Tissue Transglut Ab <2 0 - 5 U/mL  VITAMIN D  25 Hydroxy (Vit-D Deficiency, Fractures)   Collection Time: 08/30/23  2:32 PM  Result Value Ref Range   Vit D, 25-Hydroxy 32.3 30.0 - 100.0 ng/mL      Assessment & Plan:   Problem List Items Addressed This Visit   None Visit Diagnoses       Acute right-sided low back pain without sciatica    -  Primary   Continue with Meloxicam . Will treat with prednisone  and methocarbamol . Xray ordered of lumbar spine. Will make recommendations based on results.   Relevant Medications   methocarbamol  (ROBAXIN ) 750 MG tablet   methylPREDNISolone  (MEDROL  DOSEPAK) 4 MG TBPK tablet   Other Relevant Orders   DG Lumbar Spine Complete        Follow up plan: No follow-ups on file.

## 2023-11-12 LAB — CBC WITH DIFFERENTIAL/PLATELET
Basophils Absolute: 0 10*3/uL (ref 0.0–0.2)
Basos: 1 %
EOS (ABSOLUTE): 0.3 10*3/uL (ref 0.0–0.4)
Eos: 5 %
Hematocrit: 42.4 % (ref 37.5–51.0)
Hemoglobin: 14 g/dL (ref 13.0–17.7)
Immature Grans (Abs): 0 10*3/uL (ref 0.0–0.1)
Immature Granulocytes: 0 %
Lymphocytes Absolute: 1.5 10*3/uL (ref 0.7–3.1)
Lymphs: 28 %
MCH: 30.8 pg (ref 26.6–33.0)
MCHC: 33 g/dL (ref 31.5–35.7)
MCV: 93 fL (ref 79–97)
Monocytes Absolute: 0.5 10*3/uL (ref 0.1–0.9)
Monocytes: 10 %
Neutrophils Absolute: 3 10*3/uL (ref 1.4–7.0)
Neutrophils: 56 %
Platelets: 212 10*3/uL (ref 150–450)
RBC: 4.54 x10E6/uL (ref 4.14–5.80)
RDW: 11.6 % (ref 11.6–15.4)
WBC: 5.3 10*3/uL (ref 3.4–10.8)

## 2023-11-12 LAB — COMPREHENSIVE METABOLIC PANEL WITH GFR
ALT: 21 IU/L (ref 0–44)
AST: 18 IU/L (ref 0–40)
Albumin: 4.5 g/dL (ref 3.8–4.9)
Alkaline Phosphatase: 67 IU/L (ref 44–121)
BUN/Creatinine Ratio: 18 (ref 9–20)
BUN: 16 mg/dL (ref 6–24)
Bilirubin Total: 0.4 mg/dL (ref 0.0–1.2)
CO2: 23 mmol/L (ref 20–29)
Calcium: 9.6 mg/dL (ref 8.7–10.2)
Chloride: 108 mmol/L — ABNORMAL HIGH (ref 96–106)
Creatinine, Ser: 0.9 mg/dL (ref 0.76–1.27)
Globulin, Total: 2 g/dL (ref 1.5–4.5)
Glucose: 92 mg/dL (ref 70–99)
Potassium: 4.7 mmol/L (ref 3.5–5.2)
Sodium: 144 mmol/L (ref 134–144)
Total Protein: 6.5 g/dL (ref 6.0–8.5)
eGFR: 100 mL/min/{1.73_m2} (ref >59)

## 2023-11-15 ENCOUNTER — Ambulatory Visit: Payer: Self-pay | Admitting: Dermatology

## 2023-11-15 DIAGNOSIS — M5416 Radiculopathy, lumbar region: Secondary | ICD-10-CM | POA: Diagnosis not present

## 2023-11-16 ENCOUNTER — Ambulatory Visit: Payer: Self-pay | Admitting: Nurse Practitioner

## 2023-11-30 DIAGNOSIS — M5451 Vertebrogenic low back pain: Secondary | ICD-10-CM | POA: Diagnosis not present

## 2023-12-07 DIAGNOSIS — M5451 Vertebrogenic low back pain: Secondary | ICD-10-CM | POA: Diagnosis not present

## 2024-01-04 ENCOUNTER — Ambulatory Visit: Payer: BC Managed Care – PPO | Admitting: Dermatology

## 2024-01-13 ENCOUNTER — Ambulatory Visit: Payer: BC Managed Care – PPO | Admitting: Dermatology

## 2024-02-01 DIAGNOSIS — J3089 Other allergic rhinitis: Secondary | ICD-10-CM | POA: Diagnosis not present

## 2024-02-01 DIAGNOSIS — J3081 Allergic rhinitis due to animal (cat) (dog) hair and dander: Secondary | ICD-10-CM | POA: Diagnosis not present

## 2024-02-17 ENCOUNTER — Encounter: Payer: Self-pay | Admitting: Dermatology

## 2024-02-17 ENCOUNTER — Ambulatory Visit: Payer: BC Managed Care – PPO | Admitting: Dermatology

## 2024-02-17 DIAGNOSIS — L130 Dermatitis herpetiformis: Secondary | ICD-10-CM | POA: Diagnosis not present

## 2024-02-17 DIAGNOSIS — Z7189 Other specified counseling: Secondary | ICD-10-CM

## 2024-02-17 DIAGNOSIS — Z79899 Other long term (current) drug therapy: Secondary | ICD-10-CM

## 2024-02-17 NOTE — Patient Instructions (Addendum)
 Continue Dapsone  50 mg daily     Recommend daily broad spectrum sunscreen SPF 30+ to sun-exposed areas, reapply every 2 hours as needed. Call for new or changing lesions.  Staying in the shade or wearing long sleeves, sun glasses (UVA+UVB protection) and wide brim hats (4-inch brim around the entire circumference of the hat) are also recommended for sun protection.     Due to recent changes in healthcare laws, you may see results of your pathology and/or laboratory studies on MyChart before the doctors have had a chance to review them. We understand that in some cases there may be results that are confusing or concerning to you. Please understand that not all results are received at the same time and often the doctors may need to interpret multiple results in order to provide you with the best plan of care or course of treatment. Therefore, we ask that you please give us  2 business days to thoroughly review all your results before contacting the office for clarification. Should we see a critical lab result, you will be contacted sooner.   If You Need Anything After Your Visit  If you have any questions or concerns for your doctor, please call our main line at [phone] and press option 4 to reach your doctor's medical assistant. If no one answers, please leave a voicemail as directed and we will return your call as soon as possible. Messages left after 4 pm will be answered the following business day.   You may also send us  a message via MyChart. We typically respond to MyChart messages within 1-2 business days.  For prescription refills, please ask your pharmacy to contact our office. Our fax number is [phone].  If you have an urgent issue when the clinic is closed that cannot wait until the next business day, you can page your doctor at the number below.    Please note that while we do our best to be available for urgent issues outside of office hours, we are not available 24/7.    If you have an urgent issue and are unable to reach us , you may choose to seek medical care at your doctor's office, retail clinic, urgent care center, or emergency room.  If you have a medical emergency, please immediately call 911 or go to the emergency department.  Pager Numbers  - Dr. Hester: [phone]  - Dr. Jackquline: [phone]  - Dr. Claudene: [phone]   - Dr. Raymund: [phone]  In the event of inclement weather, please call our main line at [phone] for an update on the status of any delays or closures.  Dermatology Medication Tips: Please keep the boxes that topical medications come in in order to help keep track of the instructions about where and how to use these. Pharmacies typically print the medication instructions only on the boxes and not directly on the medication tubes.   If your medication is too expensive, please contact our office at [phone] option 4 or send us  a message through MyChart.   We are unable to tell what your co-pay for medications will be in advance as this is different depending on your insurance coverage. However, we may be able to find a substitute medication at lower cost or fill out paperwork to get insurance to cover a needed medication.   If a prior authorization is required to get your medication covered by your insurance company, please allow us  1-2 business days to complete this process.  Drug prices often vary depending on  where the prescription is filled and some pharmacies may offer cheaper prices.  The website www.goodrx.com contains coupons for medications through different pharmacies. The prices here do not account for what the cost may be with help from insurance (it may be cheaper with your insurance), but the website can give you the price if you did not use any insurance.  - You can print the associated coupon and take it with your prescription to the pharmacy.  - You may also stop by our office during regular  business hours and pick up a GoodRx coupon card.  - If you need your prescription sent electronically to a different pharmacy, notify our office through [facility] or by phone at [phone] option 4.     Si Usted Necesita Algo Despus de Su Visita  Tambin puede enviarnos un mensaje a travs de Clinical cytogeneticist. Por lo general respondemos a los mensajes de MyChart en el transcurso de 1 a 2 das hbiles.  Para renovar recetas, por favor pida a su farmacia que se ponga en contacto con nuestra oficina. Randi lakes de fax es [place] [phone].  Si tiene un asunto urgente cuando la clnica est cerrada y que no puede esperar hasta el siguiente da hbil, puede llamar/localizar a su doctor(a) al nmero que aparece a continuacin.   Por favor, tenga en cuenta que aunque hacemos todo lo posible para estar disponibles para asuntos urgentes fuera del horario de [place], no estamos disponibles las 24 horas del da, los 7 [address]  [address] de la [place].   Si tiene un problema urgente y no puede comunicarse con nosotros, puede optar por buscar atencin mdica  en el consultorio de su doctor(a), en una clnica privada, en un centro de atencin urgente o en una sala de emergencias.  Si tiene Engineer, drilling, por favor llame inmediatamente al 911 o vaya a la sala de emergencias.  Nmeros de bper  - Dr. Hester: [phone]  - Dra. Jackquline: [phone]  - Dr. Claudene: [phone]  - Dra. Kitts: [phone]  En caso de inclemencias del [place], por favor llame a nuestra lnea principal al [phone] para una actualizacin sobre el estado de cualquier retraso o cierre.  Consejos para la medicacin en dermatologa: Por favor, guarde las cajas en las que vienen los medicamentos de uso tpico para ayudarle a seguir las instrucciones sobre dnde y cmo usarlos. Las farmacias generalmente imprimen las instrucciones del medicamento slo en las cajas y no directamente en los tubos del [place].   Si su  medicamento es muy caro, por favor, pngase en contacto con landry rieger llamando al [phone] y presione la opcin 4 o envenos un mensaje a travs de Clinical cytogeneticist.   No podemos decirle cul ser su copago por los medicamentos por adelantado ya que esto es diferente dependiendo de la cobertura de su seguro. Sin embargo, es posible que podamos encontrar un medicamento sustituto a Audiological scientist un formulario para que el seguro cubra el medicamento que se considera necesario.   Si se requiere una autorizacin previa para que su compaa de seguros malta su medicamento, por favor permtanos de 1 a 2 das hbiles para completar este proceso.  Los precios de los medicamentos varan con frecuencia dependiendo del Environmental consultant de dnde se surte la receta y alguna farmacias pueden ofrecer precios ms baratos.  El sitio web www.goodrx.com tiene cupones para medicamentos de Health and safety inspector. Los precios aqu no tienen en cuenta lo que podra costar con la ayuda del seguro (puede ser ms barato  con su seguro), pero el sitio web puede darle el precio si no Visual merchandiser.  - Puede imprimir el cupn correspondiente y llevarlo con su receta a la farmacia.  - Tambin puede pasar por nuestra oficina durante el horario de atencin regular y Education officer, museum una tarjeta de cupones de GoodRx.  - Si necesita que su receta se enve electrnicamente a una farmacia diferente, informe a nuestra oficina a travs de MyChart de [place] o por telfono llamando al [phone] y presione la opcin 4.

## 2024-02-17 NOTE — Progress Notes (Signed)
   Follow-Up Visit   Subjective  Jesse Wade is a 58 y.o. male who presents for the following: dermatitis herpetiformis 6 month follow up. Patient is currently taking Dapsone  50 mg. Controls well. No flares recently. No side effects and has avoided gluten in his diet.   Pt accompanied by spouse.   The following portions of the chart were reviewed this encounter and updated as appropriate: medications, allergies, medical history  Review of Systems:  No other skin or systemic complaints except as noted in HPI or Assessment and Plan.  Objective  Well appearing patient in no apparent distress; mood and affect are within normal limits.   A focused examination was performed of the following areas: Face, hands, elbows  Relevant exam findings are noted in the Assessment and Plan.    Assessment & Plan   Dermatitis herpetiformis, chronic, well-controlled, at goal Exam: clear   Patient is seeing GI, received colonoscopy and endoscopy with no visible disease but celiac supported on small intestine pathology 04/22/23. Gluten levels decreasing.    Treatment plan: Continue Dapsone  50 mg daily. Okay to decrease to 1 tablet daily (25 mg) to see if stays controlled. Resume Dapsone  50 mg daily if not controlled at 25 mg daily.  Reviewed potential side effects. Tolerating with no side effects such as no dyspnea, cyanosis, grip weakness. -Repeat labs today and every 3 months. -Continue avoiding gluten in diet - Call GI to schedule yearly visit for 08/2024.   Return in about 6 months (around 02/15/2024) for w/ Dr. Claudene.  I, Jill Parcell, CMA, am acting as scribe for Boneta Claudene, MD.   Documentation: I have reviewed the above documentation for accuracy and completeness, and I agree with the above.  Boneta Claudene, MD

## 2024-02-29 ENCOUNTER — Other Ambulatory Visit: Payer: Self-pay | Admitting: Dermatology

## 2024-02-29 DIAGNOSIS — Z79899 Other long term (current) drug therapy: Secondary | ICD-10-CM | POA: Diagnosis not present

## 2024-02-29 DIAGNOSIS — L130 Dermatitis herpetiformis: Secondary | ICD-10-CM | POA: Diagnosis not present

## 2024-03-01 ENCOUNTER — Ambulatory Visit: Payer: Self-pay | Admitting: Dermatology

## 2024-03-01 LAB — CBC WITH DIFFERENTIAL/PLATELET
Basophils Absolute: 0.1 x10E3/uL (ref 0.0–0.2)
Basos: 1 %
EOS (ABSOLUTE): 0.2 x10E3/uL (ref 0.0–0.4)
Eos: 4 %
Hematocrit: 43.7 % (ref 37.5–51.0)
Hemoglobin: 14.2 g/dL (ref 13.0–17.7)
Immature Grans (Abs): 0 x10E3/uL (ref 0.0–0.1)
Immature Granulocytes: 0 %
Lymphocytes Absolute: 1.8 x10E3/uL (ref 0.7–3.1)
Lymphs: 29 %
MCH: 30.9 pg (ref 26.6–33.0)
MCHC: 32.5 g/dL (ref 31.5–35.7)
MCV: 95 fL (ref 79–97)
Monocytes Absolute: 0.6 x10E3/uL (ref 0.1–0.9)
Monocytes: 9 %
Neutrophils Absolute: 3.6 x10E3/uL (ref 1.4–7.0)
Neutrophils: 57 %
Platelets: 233 x10E3/uL (ref 150–450)
RBC: 4.6 x10E6/uL (ref 4.14–5.80)
RDW: 11.8 % (ref 11.6–15.4)
WBC: 6.3 x10E3/uL (ref 3.4–10.8)

## 2024-03-12 ENCOUNTER — Encounter: Payer: Self-pay | Admitting: Dermatology

## 2024-05-13 NOTE — Patient Instructions (Incomplete)
 Healthy Eating, Adult Healthy eating may help you get and keep a healthy body weight, reduce the risk of chronic disease, and live a long and productive life. It is important to follow a healthy eating pattern. Your nutritional and calorie needs should be met mainly by different nutrient-rich foods. What are tips for following this plan? Reading food labels Read labels and choose the following: Reduced or low sodium products. Juices with 100% fruit juice. Foods with low saturated fats (<3 g per serving) and high polyunsaturated and monounsaturated fats. Foods with whole grains, such as whole wheat, cracked wheat, brown rice, and wild rice. Whole grains that are fortified with folic acid . This is recommended for females who are pregnant or who want to become pregnant. Read labels and do not eat or drink the following: Foods or drinks with added sugars. These include foods that contain brown sugar, corn sweetener, corn syrup, dextrose , fructose, glucose, high-fructose corn syrup, honey, invert sugar, lactose, malt syrup, maltose, molasses, raw sugar, sucrose, trehalose, or turbinado sugar. Limit your intake of added sugars to less than 10% of your total daily calories. Do not eat more than the following amounts of added sugar per day: 6 teaspoons (25 g) for females. 9 teaspoons (38 g) for males. Foods that contain processed or refined starches and grains. Refined grain products, such as white flour, degermed cornmeal, white bread, and white rice. Shopping Choose nutrient-rich snacks, such as vegetables, whole fruits, and nuts. Avoid high-calorie and high-sugar snacks, such as potato chips, fruit snacks, and candy. Use oil-based dressings and spreads on foods instead of solid fats such as butter, margarine, sour cream, or cream cheese. Limit pre-made sauces, mixes, and instant products such as flavored rice, instant noodles, and ready-made pasta. Try more plant-protein sources, such as tofu,  tempeh, black beans, edamame, lentils, nuts, and seeds. Explore eating plans such as the Mediterranean diet or vegetarian diet. Try heart-healthy dips made with beans and healthy fats like hummus and guacamole. Vegetables go great with these. Cooking Use oil to saut or stir-fry foods instead of solid fats such as butter, margarine, or lard. Try baking, boiling, grilling, or broiling instead of frying. Remove the fatty part of meats before cooking. Steam vegetables in water  or broth. Meal planning  At meals, imagine dividing your plate into fourths: One-half of your plate is fruits and vegetables. One-fourth of your plate is whole grains. One-fourth of your plate is protein, especially lean meats, poultry, eggs, tofu, beans, or nuts. Include low-fat dairy as part of your daily diet. Lifestyle Choose healthy options in all settings, including home, work, school, restaurants, or stores. Prepare your food safely: Wash your hands after handling raw meats. Where you prepare food, keep surfaces clean by regularly washing with hot, soapy water . Keep raw meats separate from ready-to-eat foods, such as fruits and vegetables. Cook seafood, meat, poultry, and eggs to the recommended temperature. Get a food thermometer. Store foods at safe temperatures. In general: Keep cold foods at 34F (4.4C) or below. Keep hot foods at 134F (60C) or above. Keep your freezer at [facility] (-17.8C) or below. Foods are not safe to eat if they have been between the temperatures of 40-134F (4.4-60C) for more than 2 hours. What foods should I eat? Fruits Aim to eat 1-2 cups of fresh, canned (in natural juice), or frozen fruits each day. One cup of fruit equals 1 small apple, 1 large banana, 8 large strawberries, 1 cup (237 g) canned fruit,  cup (82 g) dried fruit,  or 1 cup (240 mL) 100% juice. Vegetables Aim to eat 2-4 cups of fresh and frozen vegetables each day, including different varieties and colors. One cup  of vegetables equals 1 cup (91 g) broccoli or cauliflower florets, 2 medium carrots, 2 cups (150 g) raw, leafy greens, 1 large tomato, 1 large bell pepper, 1 large sweet potato, or 1 medium white potato. Grains Aim to eat 5-10 ounce-equivalents of whole grains each day. Examples of 1 ounce-equivalent of grains include 1 slice of bread, 1 cup (40 g) ready-to-eat cereal, 3 cups (24 g) popcorn, or  cup (93 g) cooked rice. Meats and other proteins Try to eat 5-7 ounce-equivalents of protein each day. Examples of 1 ounce-equivalent of protein include 1 egg,  oz nuts (12 almonds, 24 pistachios, or 7 walnut halves), 1/4 cup (90 g) cooked beans, 6 tablespoons (90 g) hummus or 1 tablespoon (16 g) peanut butter. A cut of meat or fish that is the size of a deck of cards is about 3-4 ounce-equivalents (85 g). Of the protein you eat each week, try to have at least 8 sounce (227 g) of seafood. This is about 2 servings per week. This includes salmon, trout, herring, sardines, and anchovies. Dairy Aim to eat 3 cup-equivalents of fat-free or low-fat dairy each day. Examples of 1 cup-equivalent of dairy include 1 cup (240 mL) milk, 8 ounces (250 g) yogurt, 1 ounces (44 g) natural cheese, or 1 cup (240 mL) fortified soy milk. Fats and oils Aim for about 5 teaspoons (21 g) of fats and oils per day. Choose monounsaturated fats, such as canola and olive oils, mayonnaise made with olive oil or avocado oil, avocados, peanut butter, and most nuts, or polyunsaturated fats, such as sunflower, corn, and soybean oils, walnuts, pine nuts, sesame seeds, sunflower seeds, and flaxseed. Beverages Aim for 6 eight-ounce glasses of water  per day. Limit coffee to 3-5 eight-ounce cups per day. Limit caffeinated beverages that have added calories, such as soda and energy drinks. If you drink alcohol: Limit how much you have to: 0-1 drink a day if you are male. 0-2 drinks a day if you are male. Know how much alcohol is in your drink.  In the [place] one drink is one 12 oz bottle of beer (355 mL), one 5 oz glass of wine (148 mL), or one 1 oz glass of hard liquor (44 mL). Seasoning and other foods Try not to add too much salt to your food. Try using herbs and spices instead of salt. Try not to add sugar to food. This information is based on [place] nutrition guidelines. To learn more, visit DisposableNylon.be. Exact amounts may vary. You may need different amounts. This information is not intended to replace advice given to you by your health care provider. Make sure you discuss any questions you have with your health care provider. Document Revised: 03/09/2022 Document Reviewed: 03/09/2022 Elsevier Patient Education  2024 [employer].

## 2024-05-15 ENCOUNTER — Ambulatory Visit (INDEPENDENT_AMBULATORY_CARE_PROVIDER_SITE_OTHER): Payer: Self-pay | Admitting: Nurse Practitioner

## 2024-05-15 ENCOUNTER — Encounter: Payer: Self-pay | Admitting: Nurse Practitioner

## 2024-05-15 DIAGNOSIS — K2100 Gastro-esophageal reflux disease with esophagitis, without bleeding: Secondary | ICD-10-CM | POA: Diagnosis not present

## 2024-05-15 DIAGNOSIS — Z13220 Encounter for screening for lipoid disorders: Secondary | ICD-10-CM

## 2024-05-15 DIAGNOSIS — I10 Essential (primary) hypertension: Secondary | ICD-10-CM | POA: Diagnosis not present

## 2024-05-15 DIAGNOSIS — Z136 Encounter for screening for cardiovascular disorders: Secondary | ICD-10-CM

## 2024-05-15 DIAGNOSIS — Z0000 Encounter for general adult medical examination without abnormal findings: Secondary | ICD-10-CM | POA: Diagnosis not present

## 2024-05-15 DIAGNOSIS — K900 Celiac disease: Secondary | ICD-10-CM

## 2024-05-15 DIAGNOSIS — G4733 Obstructive sleep apnea (adult) (pediatric): Secondary | ICD-10-CM | POA: Diagnosis not present

## 2024-05-15 DIAGNOSIS — N400 Enlarged prostate without lower urinary tract symptoms: Secondary | ICD-10-CM | POA: Diagnosis not present

## 2024-05-15 MED ORDER — BENAZEPRIL HCL 40 MG PO TABS: 40.0000 mg | ORAL_TABLET | Freq: Every day | ORAL | 3 refills | Status: AC

## 2024-05-15 MED ORDER — OMEPRAZOLE 20 MG PO CPDR: 20.0000 mg | DELAYED_RELEASE_CAPSULE | Freq: Every day | ORAL | 4 refills | Status: AC

## 2024-05-15 NOTE — Progress Notes (Signed)
 BP 121/76 (BP Location: Left Arm, Patient Position: Sitting, Cuff Size: Large)   Pulse 68   Temp 97.9 F (36.6 C) (Oral)   Resp 15   Ht 6' 5.99 (1.981 m)   Wt (!) 344 lb (156 kg)   SpO2 98%   BMI 39.76 kg/m    Subjective:    Patient ID: Jesse Wade, male    DOB: January 10, 1966, 58 y.o.   MRN: [id number]  HPI: Jesse Wade is a 58 y.o. male  Chief Complaint  Patient presents with   Annual Exam   HYPERTENSION  Continues Benazepril  40 MG daily.  Uses CPAP 100% of the time, diagnosed >6 years ago. Uses religiously.  Hypertension status: stable  Satisfied with current treatment? yes Duration of hypertension: chronic BP monitoring frequency:  rarely BP range: not often checking BP medication side effects:  no Medication compliance: good compliance Aspirin: no Recurrent headaches: no Visual changes: no Palpitations: no Dyspnea: no Chest pain: no Lower extremity edema: no Dizzy/lightheaded: no  The 10-year ASCVD risk score (Arnett DK, et al., 2019) is: 5.4%   Values used to calculate the score:     Age: 60 years     Clincally relevant sex: Male     Is Non-Hispanic African American: No     Diabetic: No     Tobacco smoker: No     Systolic Blood Pressure: 121 mmHg     Is BP treated: Yes     HDL Cholesterol: 47 mg/dL     Total Cholesterol: 148 mg/dL   GERD Taking Omeprazole  daily. Diagnosed with Celiac during colonoscopy, 3a -- diagnosed 04/22/23.  Got a rash with gluten. Saw GI last on 08/30/23. GERD control status: stable Satisfied with current treatment? yes Heartburn frequency: none Medication side effects: no  Medication compliance: stable Dysphagia: no Odynophagia:  no Hematemesis: no Blood in stool: no EGD: no   Relevant past medical, surgical, family and social history reviewed and updated as indicated. Interim medical history since our last visit reviewed. Allergies and medications reviewed and updated.  Review of Systems  Constitutional:  Negative for  activity change, diaphoresis, fatigue and fever.  Respiratory:  Negative for cough, chest tightness, shortness of breath and wheezing.   Cardiovascular:  Negative for chest pain, palpitations and leg swelling.  Gastrointestinal: Negative.   Neurological: Negative.   Psychiatric/Behavioral: Negative.     Per HPI unless specifically indicated above     Objective:    BP 121/76 (BP Location: Left Arm, Patient Position: Sitting, Cuff Size: Large)   Pulse 68   Temp 97.9 F (36.6 C) (Oral)   Resp 15   Ht 6' 5.99 (1.981 m)   Wt (!) 344 lb (156 kg)   SpO2 98%   BMI 39.76 kg/m   Wt Readings from Last 3 Encounters:  05/15/24 (!) 344 lb (156 kg)  11/11/23 (!) 336 lb 12.8 oz (152.8 kg)  08/30/23 (!) 342 lb 3.2 oz (155.2 kg)    Physical Exam Vitals and nursing note reviewed.  Constitutional:      General: He is awake. He is not in acute distress.    Appearance: He is well-developed and well-groomed. He is obese. He is not ill-appearing or toxic-appearing.  HENT:     Head: Normocephalic and atraumatic.     Right Ear: Hearing, tympanic membrane, ear canal and external ear normal. No drainage.     Left Ear: Hearing, tympanic membrane, ear canal and external ear normal. No drainage.  Nose: Nose normal.     Mouth/Throat:     Pharynx: Uvula midline.  Eyes:     General: Lids are normal.        Right eye: No discharge.        Left eye: No discharge.     Extraocular Movements: Extraocular movements intact.     Conjunctiva/sclera: Conjunctivae normal.     Pupils: Pupils are equal, round, and reactive to light.     Visual Fields: Right eye visual fields normal and left eye visual fields normal.  Neck:     Thyroid : No thyromegaly.     Vascular: No carotid bruit or JVD.     Trachea: Trachea normal.  Cardiovascular:     Rate and Rhythm: Normal rate and regular rhythm.     Heart sounds: Normal heart sounds, S1 normal and S2 normal. No murmur heard.    No gallop.  Pulmonary:     Effort:  Pulmonary effort is normal. No accessory muscle usage or respiratory distress.     Breath sounds: Normal breath sounds.  Abdominal:     General: Bowel sounds are normal.     Palpations: Abdomen is soft. There is no hepatomegaly or splenomegaly.     Tenderness: There is no abdominal tenderness.  Musculoskeletal:        General: Normal range of motion.     Cervical back: Normal range of motion and neck supple.     Right lower leg: No edema.     Left lower leg: No edema.  Lymphadenopathy:     Head:     Right side of head: No submental, submandibular, tonsillar, preauricular or posterior auricular adenopathy.     Left side of head: No submental, submandibular, tonsillar, preauricular or posterior auricular adenopathy.     Cervical: No cervical adenopathy.  Skin:    General: Skin is warm and dry.     Capillary Refill: Capillary refill takes less than 2 seconds.     Findings: No rash.  Neurological:     Mental Status: He is alert and oriented to person, place, and time.     Gait: Gait is intact.     Deep Tendon Reflexes: Reflexes are normal and symmetric.     Reflex Scores:      Brachioradialis reflexes are 2+ on the right side and 2+ on the left side.      Patellar reflexes are 2+ on the right side and 2+ on the left side. Psychiatric:        Attention and Perception: Attention normal.        Mood and Affect: Mood normal.        Speech: Speech normal.        Behavior: Behavior normal. Behavior is cooperative.        Thought Content: Thought content normal.        Cognition and Memory: Cognition normal.    Results for orders placed or performed in visit on 02/29/24  CBC with Differential/Platelet   Collection Time: 02/29/24  1:53 PM  Result Value Ref Range   WBC 6.3 3.4 - 10.8 x10E3/uL   RBC 4.60 4.14 - 5.80 x10E6/uL   Hemoglobin 14.2 13.0 - 17.7 g/dL   Hematocrit 56.2 62.4 - 51.0 %   MCV 95 79 - 97 fL   MCH 30.9 26.6 - 33.0 pg   MCHC 32.5 31.5 - 35.7 g/dL   RDW 88.1 88.3 -  84.5 %   Platelets 233 150 - 450 x10E3/uL  Neutrophils 57 Not Estab. %   Lymphs 29 Not Estab. %   Monocytes 9 Not Estab. %   Eos 4 Not Estab. %   Basos 1 Not Estab. %   Neutrophils Absolute 3.6 1.4 - 7.0 x10E3/uL   Lymphocytes Absolute 1.8 0.7 - 3.1 x10E3/uL   Monocytes Absolute 0.6 0.1 - 0.9 x10E3/uL   EOS (ABSOLUTE) 0.2 0.0 - 0.4 x10E3/uL   Basophils Absolute 0.1 0.0 - 0.2 x10E3/uL   Immature Granulocytes 0 Not Estab. %   Immature Grans (Abs) 0.0 0.0 - 0.1 x10E3/uL      Assessment & Plan:   Problem List Items Addressed This Visit       Cardiovascular and Mediastinum   Hypertension   Chronic, ongoing with BP at goal today.  Recommend he monitor BP at least a few mornings a week at home and document.  DASH diet at home.  Continue current medication regimen and adjust as needed.  Labs today: CBC, CMP, TSH, lipid.  Refills sent in.      Relevant Medications   benazepril  (LOTENSIN ) 40 MG tablet   Other Relevant Orders   CBC with Differential/Platelet   Comprehensive metabolic panel with GFR   TSH     Respiratory   OSA treated with BiPAP   Chronic, stable with 100% use of CPAP.  Continue this consistent use.        Digestive   Reflux esophagitis   Chronic, ongoing.  At this time will continue Omeprazole , but recommend occasional trial at cutting back.  Risks of PPI use were discussed with patient including bone loss, C. Diff diarrhea, pneumonia, infections, CKD, electrolyte abnormalities.  Verbalizes understanding and chooses to continue the medication. Mag level next visit.      Relevant Medications   omeprazole  (PRILOSEC) 20 MG capsule   Adult celiac disease   Diagnosed on 04/22/23.  Works on gluten free diet and doing well. Continue collaboration with dermatology and GI, recent notes reviewed. Discussed with him possibly looking into dietician.        Other   Morbid obesity (HCC) - Primary   BMI 39.76 with HTN and OSA.  Recommended eating smaller high protein, low  fat meals more frequently and exercising 30 mins a day 5 times a week with a goal of 10-15lb weight loss in the next 3 months. Patient voiced their understanding and motivation to adhere to these recommendations.       Other Visit Diagnoses       Benign prostatic hyperplasia without lower urinary tract symptoms       PSA on labs.   Relevant Orders   PSA     Encounter for lipid screening for cardiovascular disease       Lipid panel obtained today   Relevant Orders   Lipid Panel w/o Chol/HDL Ratio     Encounter for annual physical exam       Annual physical today with labs and health maintenance reviewed, discussed with patient.        Follow up plan: Return in about 1 year (around 05/15/2025) for Annual Physical.

## 2024-05-15 NOTE — Assessment & Plan Note (Signed)
 Chronic, ongoing with BP at goal today.  Recommend he monitor BP at least a few mornings a week at home and document.  DASH diet at home.  Continue current medication regimen and adjust as needed.  Labs today: CBC, CMP, TSH, lipid.  Refills sent in.

## 2024-05-15 NOTE — Assessment & Plan Note (Addendum)
 Chronic, ongoing.  At this time will continue Omeprazole , but recommend occasional trial at cutting back.  Risks of PPI use were discussed with patient including bone loss, C. Diff diarrhea, pneumonia, infections, CKD, electrolyte abnormalities.  Verbalizes understanding and chooses to continue the medication. Mag level next visit.

## 2024-05-15 NOTE — Assessment & Plan Note (Signed)
 Diagnosed on 04/22/23.  Works on gluten free diet and doing well. Continue collaboration with dermatology and GI, recent notes reviewed. Discussed with him possibly looking into dietician.

## 2024-05-15 NOTE — Assessment & Plan Note (Signed)
 BMI 39.76 with HTN and OSA.  Recommended eating smaller high protein, low fat meals more frequently and exercising 30 mins a day 5 times a week with a goal of 10-15lb weight loss in the next 3 months. Patient voiced their understanding and motivation to adhere to these recommendations.

## 2024-05-15 NOTE — Assessment & Plan Note (Signed)
 Chronic, stable with 100% use of CPAP.  Continue this consistent use.

## 2024-05-16 ENCOUNTER — Ambulatory Visit: Payer: Self-pay | Admitting: Nurse Practitioner

## 2024-05-16 LAB — CBC WITH DIFFERENTIAL/PLATELET
Basophils Absolute: 0.1 x10E3/uL (ref 0.0–0.2)
Basos: 1 %
EOS (ABSOLUTE): 0.2 x10E3/uL (ref 0.0–0.4)
Eos: 3 %
Hematocrit: 42.8 % (ref 37.5–51.0)
Hemoglobin: 14.3 g/dL (ref 13.0–17.7)
Immature Grans (Abs): 0 x10E3/uL (ref 0.0–0.1)
Immature Granulocytes: 0 %
Lymphocytes Absolute: 1.9 x10E3/uL (ref 0.7–3.1)
Lymphs: 27 %
MCH: 31.6 pg (ref 26.6–33.0)
MCHC: 33.4 g/dL (ref 31.5–35.7)
MCV: 95 fL (ref 79–97)
Monocytes Absolute: 0.6 x10E3/uL (ref 0.1–0.9)
Monocytes: 9 %
Neutrophils Absolute: 4.3 x10E3/uL (ref 1.4–7.0)
Neutrophils: 59 %
Platelets: 240 x10E3/uL (ref 150–450)
RBC: 4.52 x10E6/uL (ref 4.14–5.80)
RDW: 11.9 % (ref 11.6–15.4)
WBC: 7 x10E3/uL (ref 3.4–10.8)

## 2024-05-16 LAB — TSH: TSH: 2.84 u[IU]/mL (ref 0.450–4.500)

## 2024-05-16 LAB — COMPREHENSIVE METABOLIC PANEL WITH GFR
ALT: 24 IU/L (ref 0–44)
AST: 21 IU/L (ref 0–40)
Albumin: 4.7 g/dL (ref 3.8–4.9)
Alkaline Phosphatase: 72 IU/L (ref 47–123)
BUN/Creatinine Ratio: 27 — ABNORMAL HIGH (ref 9–20)
BUN: 24 mg/dL (ref 6–24)
Bilirubin Total: 0.5 mg/dL (ref 0.0–1.2)
CO2: 22 mmol/L (ref 20–29)
Calcium: 9.7 mg/dL (ref 8.7–10.2)
Chloride: 105 mmol/L (ref 96–106)
Creatinine, Ser: 0.89 mg/dL (ref 0.76–1.27)
Globulin, Total: 2 g/dL (ref 1.5–4.5)
Glucose: 96 mg/dL (ref 70–99)
Potassium: 4.1 mmol/L (ref 3.5–5.2)
Sodium: 143 mmol/L (ref 134–144)
Total Protein: 6.7 g/dL (ref 6.0–8.5)
eGFR: 100 mL/min/1.73 (ref >59)

## 2024-05-16 LAB — LIPID PANEL W/O CHOL/HDL RATIO
Cholesterol, Total: 142 mg/dL (ref 100–199)
HDL: 50 mg/dL (ref >39)
LDL Chol Calc (NIH): 72 mg/dL (ref 0–99)
Triglycerides: 112 mg/dL (ref 0–149)
VLDL Cholesterol Cal: 20 mg/dL (ref 5–40)

## 2024-05-16 LAB — PSA: Prostate Specific Ag, Serum: 0.4 ng/mL (ref 0.0–4.0)

## 2024-05-16 NOTE — Progress Notes (Signed)
 Contacted via MyChart  Good afternoon Jesse Wade, your labs have returned and continue to look fabulous!!  No changes needed. Great job!! Any questions? Keep being amazing!!  Thank you for allowing me to participate in your care.  I appreciate you. Kindest regards, Amontae Ng

## 2024-05-22 ENCOUNTER — Encounter: Payer: Self-pay | Admitting: Dermatology

## 2024-05-30 ENCOUNTER — Telehealth: Admitting: Physician Assistant

## 2024-05-30 DIAGNOSIS — H6992 Unspecified Eustachian tube disorder, left ear: Secondary | ICD-10-CM | POA: Diagnosis not present

## 2024-05-30 MED ORDER — MECLIZINE HCL 25 MG PO TABS: 25.0000 mg | ORAL_TABLET | Freq: Three times a day (TID) | ORAL | 0 refills | Status: AC | PRN

## 2024-05-30 MED ORDER — IPRATROPIUM BROMIDE 0.03 % NA SOLN: 2.0000 | Freq: Two times a day (BID) | NASAL | 0 refills | Status: AC

## 2024-05-30 NOTE — Progress Notes (Signed)

## 2024-08-17 ENCOUNTER — Ambulatory Visit: Admitting: Dermatology

## 2025-05-16 ENCOUNTER — Encounter: Admitting: Nurse Practitioner
